# Patient Record
Sex: Male | Born: 1975
Health system: Southern US, Community
[De-identification: ages and names within clinical notes are randomized; demographics above are authoritative.]

## PROBLEM LIST (undated history)

## (undated) DIAGNOSIS — R519 Headache, unspecified: Secondary | ICD-10-CM

## (undated) DIAGNOSIS — G9389 Other specified disorders of brain: Secondary | ICD-10-CM

## (undated) DIAGNOSIS — N400 Enlarged prostate without lower urinary tract symptoms: Secondary | ICD-10-CM

## (undated) DIAGNOSIS — Q8500 Neurofibromatosis, unspecified: Secondary | ICD-10-CM

## (undated) HISTORY — PX: HAND SURGERY: SHX662

## (undated) HISTORY — PX: LEG SURGERY: SHX1003

## (undated) HISTORY — DX: Neurofibromatosis, unspecified: Q85.00

## (undated) HISTORY — DX: Benign prostatic hyperplasia without lower urinary tract symptoms: N40.0

---

## 2015-07-19 DIAGNOSIS — M9902 Segmental and somatic dysfunction of thoracic region: Secondary | ICD-10-CM | POA: Diagnosis not present

## 2015-07-19 DIAGNOSIS — M545 Low back pain: Secondary | ICD-10-CM | POA: Diagnosis not present

## 2015-07-19 DIAGNOSIS — M9903 Segmental and somatic dysfunction of lumbar region: Secondary | ICD-10-CM | POA: Diagnosis not present

## 2015-07-19 DIAGNOSIS — M9901 Segmental and somatic dysfunction of cervical region: Secondary | ICD-10-CM | POA: Diagnosis not present

## 2015-07-19 DIAGNOSIS — M791 Myalgia: Secondary | ICD-10-CM | POA: Diagnosis not present

## 2015-07-19 DIAGNOSIS — M542 Cervicalgia: Secondary | ICD-10-CM | POA: Diagnosis not present

## 2015-07-20 ENCOUNTER — Ambulatory Visit (INDEPENDENT_AMBULATORY_CARE_PROVIDER_SITE_OTHER): Payer: BLUE CROSS/BLUE SHIELD | Admitting: Family Medicine

## 2015-07-20 ENCOUNTER — Encounter: Payer: Self-pay | Admitting: Family Medicine

## 2015-07-20 VITALS — BP 132/81 | HR 66 | Temp 98.2°F | Resp 16 | Ht 72.0 in | Wt 182.0 lb

## 2015-07-20 DIAGNOSIS — IMO0001 Reserved for inherently not codable concepts without codable children: Secondary | ICD-10-CM

## 2015-07-20 DIAGNOSIS — Q8500 Neurofibromatosis, unspecified: Secondary | ICD-10-CM | POA: Diagnosis not present

## 2015-07-20 DIAGNOSIS — N400 Enlarged prostate without lower urinary tract symptoms: Secondary | ICD-10-CM

## 2015-07-20 DIAGNOSIS — N529 Male erectile dysfunction, unspecified: Secondary | ICD-10-CM | POA: Diagnosis not present

## 2015-07-20 DIAGNOSIS — Z0000 Encounter for general adult medical examination without abnormal findings: Secondary | ICD-10-CM

## 2015-07-20 DIAGNOSIS — I781 Nevus, non-neoplastic: Secondary | ICD-10-CM

## 2015-07-20 DIAGNOSIS — D1801 Hemangioma of skin and subcutaneous tissue: Secondary | ICD-10-CM | POA: Insufficient documentation

## 2015-07-20 DIAGNOSIS — L821 Other seborrheic keratosis: Secondary | ICD-10-CM | POA: Insufficient documentation

## 2015-07-20 HISTORY — DX: Neurofibromatosis, unspecified: Q85.00

## 2015-07-20 HISTORY — DX: Benign prostatic hyperplasia without lower urinary tract symptoms: N40.0

## 2015-07-20 MED ORDER — TAMSULOSIN HCL 0.4 MG PO CAPS: 0.4000 mg | ORAL_CAPSULE | Freq: Every day | ORAL | Status: DC

## 2015-07-20 MED ORDER — SILDENAFIL CITRATE 20 MG PO TABS: 20.0000 mg | ORAL_TABLET | ORAL | Status: DC | PRN

## 2015-07-20 NOTE — Progress Notes (Signed)
       Aaron Espinoza is a 40 y.o. male who presents to [facility] today for establish care and well visit. It has been quite some time since the patient last saw a doctor for general medical issues. He feels well. His main chronic medical problem is neurofibromatosis which typically does not cause him any problems.  He does however note as he is eating she's had some few issues come up mainly difficulty urinating and urinary urgency along with some mild erectile dysfunction. Additionally he notes several new skin lesions she is a bit concerned about. No fevers or chills nausea vomiting or diarrhea.  The skin lesion is most concerned about is on his left forehead. It is occasionally irritated and sometimes bothersome especially when he combs his hair or puts glasses on.  AUA Symptom Score is 8/35 moderate QOL score is 3 Mixed   History reviewed. No pertinent past medical history. History reviewed. No pertinent past surgical history. Social History  Substance Use Topics  . Smoking status: Current Every Day Smoker    Types: Cigars  . Smokeless tobacco: Not on file  . Alcohol Use: Yes   family history includes Cancer in his paternal grandfather.  ROS as above:  Medications: Current Outpatient Prescriptions  Medication Sig Dispense Refill  . sildenafil (REVATIO) 20 MG tablet Take 1 tablet (20 mg total) by mouth as needed. 50 tablet 11  . tamsulosin (FLOMAX) 0.4 MG CAPS capsule Take 1 capsule (0.4 mg total) by mouth daily. 30 capsule 3   No current facility-administered medications for this visit.   No Known Allergies   Exam:  BP 132/81 mmHg  Pulse 66  Temp(Src) 98.2 F (36.8 C) (Oral)  Resp 16  Ht 6' (1.829 m)  Wt 182 lb (82.555 kg)  BMI 24.68 kg/m2  SpO2 97% Gen: Well NAD HEENT: EOMI,  MMM Lungs: Normal work of breathing. CTABL Heart: RRR no MRG Abd:  NABS, Soft. Nondistended, Nontender Exts: Brisk capillary refill, warm and well perfused.  Rectal exam: Normal appearing anus with slightly enlarged prostate. Non-tender. No nodules palpated.  Skin: Occasional neurofibromatoses noted. Multiple cherry angiomas and seborrheic keratoses present. The 1 cm small lesion on the left forehead is consistent with seborrheic keratoses.  Cryosurgery left forehead seborrheic keratoses: Consent obtained timeout performed The area was sprayed with liquid nitrogen to achieve a good frost for 30 seconds. This is repeated 2 times  No results found for this or any previous visit (from the past 24 hour(s)). No results found.    Assessment and Plan: 40 y.o. male with  1) well visit: Doing well. We'll obtain basic labs. Overall patient appears to be in pretty good health. 2) erectile dysfunction: Discussed options. Check testosterone level and treat with Viagra 3) BPH likely. See scanned AUA document. Treat with Flomax. Recheck in a few months. Check PSA level 4) seborrheic keratoses: Treated with cryosurgery  Discussed warning signs or symptoms. Please see discharge instructions. Patient expresses understanding.

## 2015-07-20 NOTE — Patient Instructions (Signed)
Thank you for coming in today. Get morning fasting labs soon.  You should hear from [facility].  Start flomax.  Return in 3-6 months or sooner if needed.    Cryosurgery for Skin Conditions Cryosurgery, also called cryotherapy, is the use of extreme cold to freeze and remove abnormal or diseased tissue. Growths on the skin such as warts, precancerous skin lesions (actinic keratoses), and some kinds of skin cancer may be removed with cryosurgery. LET [facility] CARE PROVIDER KNOW ABOUT:  Any allergies you have.  All medicines you are taking, including vitamins, herbs, eye drops, creams, and over-the-counter medicines.  Previous problems you or members of your family have had with the use of anesthetics.  Any blood disorders you have.  Previous surgeries you have had.  Medical conditions you have. RISKS AND COMPLICATIONS Generally, this is a safe procedure. However, as with any procedure, complications can occur. Possible complications include:  Scars.  Changes in skin color (lighter or darker than normal skin tone).  Swelling.  Nerve damage and loss of feeling (rare). BEFORE THE PROCEDURE No preparation is necessary. PROCEDURE  Cryosurgery usually takes a few minutes and can be done in your health care provider's office. There are different methods for performing cryosurgery.   Your health care provider may use a device (probe) that has liquid nitrogen flowing through it. The liquid nitrogen cools the probe. The probe is then applied to the growth until it is frozen and destroyed.  Your health care provider may spray liquid nitrogen directly on the growth. AFTER THE PROCEDURE Shortly after the procedure, the treated area will become red and swollen. This is normal. You will be advised to keep the treated area clean and covered with a bandage until healed. You will be able to go home shortly after the procedure. You may need the treatment again if the growth comes back.    This information is not intended to replace advice given to you by your health care provider. Make sure you discuss any questions you have with your health care provider.   Document Released: 01/19/2000 Document Revised: 09/23/2012 Document Reviewed: 08/21/2012 Elsevier Interactive Patient Education 2016 Elsevier Inc[employer]   Benign Prostatic Hyperplasia An enlarged prostate (benign prostatic hyperplasia) is common in older men. You may experience the following:  Weak urine stream.  Dribbling.  Feeling like the bladder has not emptied completely.  Difficulty starting urination.  Getting up frequently at night to urinate.  Urinating more frequently during the day. HOME CARE INSTRUCTIONS  Monitor your prostatic hyperplasia for any changes. The following actions may help to alleviate any discomfort you are experiencing:  Give yourself time when you urinate.  Stay away from alcohol.  Avoid beverages containing caffeine, such as coffee, tea, and colas, because they can make the problem worse.  Avoid decongestants, antihistamines, and some prescription medicines that can make the problem worse.  Follow up with your health care provider for further treatment as recommended. SEEK MEDICAL CARE IF:  You are experiencing progressive difficulty voiding.  Your urine stream is progressively getting narrower.  You are awaking from sleep with the urge to void more frequently.  You are constantly feeling the need to void.  You experience loss of urine, especially in small amounts. SEEK IMMEDIATE MEDICAL CARE IF:   You develop increased pain with urination or are unable to urinate.  You develop severe abdominal pain, vomiting, a high fever, or fainting.  You develop back pain or blood in your urine. MAKE SURE  YOU:   Understand these instructions.  Will watch your condition.  Will get help right away if you are not doing well or get worse.   This information is not intended to  replace advice given to you by your health care provider. Make sure you discuss any questions you have with your health care provider.   Document Released: 01/21/2005 Document Revised: 02/11/2014 Document Reviewed: 06/23/2012 Elsevier Interactive Patient Education [employer].

## 2015-07-26 ENCOUNTER — Telehealth: Payer: Self-pay | Admitting: Family Medicine

## 2015-07-26 DIAGNOSIS — N400 Enlarged prostate without lower urinary tract symptoms: Secondary | ICD-10-CM | POA: Diagnosis not present

## 2015-07-26 DIAGNOSIS — M9902 Segmental and somatic dysfunction of thoracic region: Secondary | ICD-10-CM | POA: Diagnosis not present

## 2015-07-26 DIAGNOSIS — M542 Cervicalgia: Secondary | ICD-10-CM | POA: Diagnosis not present

## 2015-07-26 DIAGNOSIS — I781 Nevus, non-neoplastic: Secondary | ICD-10-CM | POA: Diagnosis not present

## 2015-07-26 DIAGNOSIS — M9901 Segmental and somatic dysfunction of cervical region: Secondary | ICD-10-CM | POA: Diagnosis not present

## 2015-07-26 DIAGNOSIS — N529 Male erectile dysfunction, unspecified: Secondary | ICD-10-CM | POA: Diagnosis not present

## 2015-07-26 DIAGNOSIS — L821 Other seborrheic keratosis: Secondary | ICD-10-CM | POA: Diagnosis not present

## 2015-07-26 DIAGNOSIS — M791 Myalgia: Secondary | ICD-10-CM | POA: Diagnosis not present

## 2015-07-26 LAB — CBC
HCT: 48.5 % (ref 38.5–50.0)
Hemoglobin: 16.7 g/dL (ref 13.2–17.1)
MCH: 30.5 pg (ref 27.0–33.0)
MCHC: 34.4 g/dL (ref 32.0–36.0)
MCV: 88.7 fL (ref 80.0–100.0)
MPV: 9.7 fL (ref 7.5–12.5)
Platelets: 266 10*3/uL (ref 140–400)
RBC: 5.47 MIL/uL (ref 4.20–5.80)
RDW: 12.6 % (ref 11.0–15.0)
WBC: 6.1 10*3/uL (ref 3.8–10.8)

## 2015-07-26 NOTE — Telephone Encounter (Signed)
Pt came in and stated that his record is showing he is an everyday smoker and he does not smoke at all and would like for that to be changed. Thanks

## 2015-07-27 ENCOUNTER — Encounter: Payer: Self-pay | Admitting: Family Medicine

## 2015-07-27 LAB — LIPID PANEL
Cholesterol: 175 mg/dL (ref 125–200)
HDL: 60 mg/dL (ref >=40)
LDL Cholesterol: 107 mg/dL (ref <130)
Total CHOL/HDL Ratio: 2.9 Ratio (ref <=5.0)
Triglycerides: 41 mg/dL (ref <150)
VLDL: 8 mg/dL (ref <30)

## 2015-07-27 LAB — COMPREHENSIVE METABOLIC PANEL
ALT: 15 U/L (ref 9–46)
AST: 17 U/L (ref 10–40)
Albumin: 4.3 g/dL (ref 3.6–5.1)
Alkaline Phosphatase: 65 U/L (ref 40–115)
BUN: 13 mg/dL (ref 7–25)
CO2: 24 mmol/L (ref 20–31)
Calcium: 9.4 mg/dL (ref 8.6–10.3)
Chloride: 100 mmol/L (ref 98–110)
Creat: 0.75 mg/dL (ref 0.60–1.35)
Glucose, Bld: 85 mg/dL (ref 65–99)
Potassium: 5 mmol/L (ref 3.5–5.3)
Sodium: 137 mmol/L (ref 135–146)
Total Bilirubin: 0.7 mg/dL (ref 0.2–1.2)
Total Protein: 7.2 g/dL (ref 6.1–8.1)

## 2015-07-27 LAB — TSH: TSH: 2.5 mIU/L (ref 0.40–4.50)

## 2015-07-27 LAB — VITAMIN D 25 HYDROXY (VIT D DEFICIENCY, FRACTURES): Vit D, 25-Hydroxy: 41 ng/mL (ref 30–100)

## 2015-07-27 LAB — HEMOGLOBIN A1C
Hgb A1c MFr Bld: 4.8 % (ref <5.7)
Mean Plasma Glucose: 91 mg/dL

## 2015-07-27 LAB — TESTOSTERONE: Testosterone: 764 ng/dL (ref 250–827)

## 2015-07-27 LAB — PSA: PSA: 0.52 ng/mL (ref <=4.00)

## 2015-07-27 NOTE — Progress Notes (Signed)
Quick Note:  Labs are very normal.  Testosterone is normal in the 700s ______

## 2015-07-27 NOTE — Telephone Encounter (Signed)
Done

## 2015-08-02 DIAGNOSIS — M791 Myalgia: Secondary | ICD-10-CM | POA: Diagnosis not present

## 2015-08-02 DIAGNOSIS — M9901 Segmental and somatic dysfunction of cervical region: Secondary | ICD-10-CM | POA: Diagnosis not present

## 2015-08-02 DIAGNOSIS — M9902 Segmental and somatic dysfunction of thoracic region: Secondary | ICD-10-CM | POA: Diagnosis not present

## 2015-08-02 DIAGNOSIS — M542 Cervicalgia: Secondary | ICD-10-CM | POA: Diagnosis not present

## 2015-08-09 DIAGNOSIS — M9902 Segmental and somatic dysfunction of thoracic region: Secondary | ICD-10-CM | POA: Diagnosis not present

## 2015-08-09 DIAGNOSIS — M9903 Segmental and somatic dysfunction of lumbar region: Secondary | ICD-10-CM | POA: Diagnosis not present

## 2015-08-09 DIAGNOSIS — M9901 Segmental and somatic dysfunction of cervical region: Secondary | ICD-10-CM | POA: Diagnosis not present

## 2015-08-09 DIAGNOSIS — M542 Cervicalgia: Secondary | ICD-10-CM | POA: Diagnosis not present

## 2015-08-09 DIAGNOSIS — M545 Low back pain: Secondary | ICD-10-CM | POA: Diagnosis not present

## 2015-08-09 DIAGNOSIS — M791 Myalgia: Secondary | ICD-10-CM | POA: Diagnosis not present

## 2015-08-16 DIAGNOSIS — M9901 Segmental and somatic dysfunction of cervical region: Secondary | ICD-10-CM | POA: Diagnosis not present

## 2015-08-16 DIAGNOSIS — M545 Low back pain: Secondary | ICD-10-CM | POA: Diagnosis not present

## 2015-08-16 DIAGNOSIS — M542 Cervicalgia: Secondary | ICD-10-CM | POA: Diagnosis not present

## 2015-08-16 DIAGNOSIS — M791 Myalgia: Secondary | ICD-10-CM | POA: Diagnosis not present

## 2015-08-16 DIAGNOSIS — M9902 Segmental and somatic dysfunction of thoracic region: Secondary | ICD-10-CM | POA: Diagnosis not present

## 2015-08-16 DIAGNOSIS — M9903 Segmental and somatic dysfunction of lumbar region: Secondary | ICD-10-CM | POA: Diagnosis not present

## 2015-08-23 DIAGNOSIS — M9902 Segmental and somatic dysfunction of thoracic region: Secondary | ICD-10-CM | POA: Diagnosis not present

## 2015-08-23 DIAGNOSIS — M9901 Segmental and somatic dysfunction of cervical region: Secondary | ICD-10-CM | POA: Diagnosis not present

## 2015-08-23 DIAGNOSIS — M9903 Segmental and somatic dysfunction of lumbar region: Secondary | ICD-10-CM | POA: Diagnosis not present

## 2015-08-23 DIAGNOSIS — M791 Myalgia: Secondary | ICD-10-CM | POA: Diagnosis not present

## 2015-08-23 DIAGNOSIS — M545 Low back pain: Secondary | ICD-10-CM | POA: Diagnosis not present

## 2015-08-23 DIAGNOSIS — M542 Cervicalgia: Secondary | ICD-10-CM | POA: Diagnosis not present

## 2015-08-30 DIAGNOSIS — M542 Cervicalgia: Secondary | ICD-10-CM | POA: Diagnosis not present

## 2015-08-30 DIAGNOSIS — M791 Myalgia: Secondary | ICD-10-CM | POA: Diagnosis not present

## 2015-08-30 DIAGNOSIS — M9903 Segmental and somatic dysfunction of lumbar region: Secondary | ICD-10-CM | POA: Diagnosis not present

## 2015-08-30 DIAGNOSIS — M9902 Segmental and somatic dysfunction of thoracic region: Secondary | ICD-10-CM | POA: Diagnosis not present

## 2015-08-30 DIAGNOSIS — M9901 Segmental and somatic dysfunction of cervical region: Secondary | ICD-10-CM | POA: Diagnosis not present

## 2015-08-30 DIAGNOSIS — M545 Low back pain: Secondary | ICD-10-CM | POA: Diagnosis not present

## 2015-09-06 DIAGNOSIS — M9901 Segmental and somatic dysfunction of cervical region: Secondary | ICD-10-CM | POA: Diagnosis not present

## 2015-09-06 DIAGNOSIS — M791 Myalgia: Secondary | ICD-10-CM | POA: Diagnosis not present

## 2015-09-06 DIAGNOSIS — M9902 Segmental and somatic dysfunction of thoracic region: Secondary | ICD-10-CM | POA: Diagnosis not present

## 2015-09-06 DIAGNOSIS — M542 Cervicalgia: Secondary | ICD-10-CM | POA: Diagnosis not present

## 2015-11-16 ENCOUNTER — Other Ambulatory Visit: Payer: Self-pay | Admitting: Family Medicine

## 2015-12-13 DIAGNOSIS — M542 Cervicalgia: Secondary | ICD-10-CM | POA: Diagnosis not present

## 2015-12-13 DIAGNOSIS — M791 Myalgia: Secondary | ICD-10-CM | POA: Diagnosis not present

## 2015-12-13 DIAGNOSIS — M9902 Segmental and somatic dysfunction of thoracic region: Secondary | ICD-10-CM | POA: Diagnosis not present

## 2015-12-13 DIAGNOSIS — M9901 Segmental and somatic dysfunction of cervical region: Secondary | ICD-10-CM | POA: Diagnosis not present

## 2015-12-20 DIAGNOSIS — M542 Cervicalgia: Secondary | ICD-10-CM | POA: Diagnosis not present

## 2015-12-20 DIAGNOSIS — M9902 Segmental and somatic dysfunction of thoracic region: Secondary | ICD-10-CM | POA: Diagnosis not present

## 2015-12-20 DIAGNOSIS — M9901 Segmental and somatic dysfunction of cervical region: Secondary | ICD-10-CM | POA: Diagnosis not present

## 2015-12-20 DIAGNOSIS — M791 Myalgia: Secondary | ICD-10-CM | POA: Diagnosis not present

## 2016-01-03 DIAGNOSIS — M9901 Segmental and somatic dysfunction of cervical region: Secondary | ICD-10-CM | POA: Diagnosis not present

## 2016-01-03 DIAGNOSIS — M9902 Segmental and somatic dysfunction of thoracic region: Secondary | ICD-10-CM | POA: Diagnosis not present

## 2016-01-03 DIAGNOSIS — M542 Cervicalgia: Secondary | ICD-10-CM | POA: Diagnosis not present

## 2016-01-03 DIAGNOSIS — M545 Low back pain: Secondary | ICD-10-CM | POA: Diagnosis not present

## 2016-01-03 DIAGNOSIS — M791 Myalgia: Secondary | ICD-10-CM | POA: Diagnosis not present

## 2016-01-03 DIAGNOSIS — M9903 Segmental and somatic dysfunction of lumbar region: Secondary | ICD-10-CM | POA: Diagnosis not present

## 2016-01-10 DIAGNOSIS — M9902 Segmental and somatic dysfunction of thoracic region: Secondary | ICD-10-CM | POA: Diagnosis not present

## 2016-01-10 DIAGNOSIS — M542 Cervicalgia: Secondary | ICD-10-CM | POA: Diagnosis not present

## 2016-01-10 DIAGNOSIS — M791 Myalgia: Secondary | ICD-10-CM | POA: Diagnosis not present

## 2016-01-10 DIAGNOSIS — M9901 Segmental and somatic dysfunction of cervical region: Secondary | ICD-10-CM | POA: Diagnosis not present

## 2016-01-24 DIAGNOSIS — M9901 Segmental and somatic dysfunction of cervical region: Secondary | ICD-10-CM | POA: Diagnosis not present

## 2016-01-24 DIAGNOSIS — M9902 Segmental and somatic dysfunction of thoracic region: Secondary | ICD-10-CM | POA: Diagnosis not present

## 2016-01-24 DIAGNOSIS — M9903 Segmental and somatic dysfunction of lumbar region: Secondary | ICD-10-CM | POA: Diagnosis not present

## 2016-01-24 DIAGNOSIS — M542 Cervicalgia: Secondary | ICD-10-CM | POA: Diagnosis not present

## 2016-01-24 DIAGNOSIS — M791 Myalgia: Secondary | ICD-10-CM | POA: Diagnosis not present

## 2016-01-24 DIAGNOSIS — M545 Low back pain: Secondary | ICD-10-CM | POA: Diagnosis not present

## 2016-02-07 DIAGNOSIS — M25552 Pain in left hip: Secondary | ICD-10-CM | POA: Diagnosis not present

## 2016-02-07 DIAGNOSIS — M9903 Segmental and somatic dysfunction of lumbar region: Secondary | ICD-10-CM | POA: Diagnosis not present

## 2016-02-07 DIAGNOSIS — M545 Low back pain: Secondary | ICD-10-CM | POA: Diagnosis not present

## 2016-02-07 DIAGNOSIS — M9905 Segmental and somatic dysfunction of pelvic region: Secondary | ICD-10-CM | POA: Diagnosis not present

## 2016-04-12 ENCOUNTER — Other Ambulatory Visit: Payer: Self-pay | Admitting: Family Medicine

## 2016-04-24 DIAGNOSIS — M545 Low back pain: Secondary | ICD-10-CM | POA: Diagnosis not present

## 2016-04-24 DIAGNOSIS — M9903 Segmental and somatic dysfunction of lumbar region: Secondary | ICD-10-CM | POA: Diagnosis not present

## 2016-04-24 DIAGNOSIS — M9901 Segmental and somatic dysfunction of cervical region: Secondary | ICD-10-CM | POA: Diagnosis not present

## 2016-04-24 DIAGNOSIS — M542 Cervicalgia: Secondary | ICD-10-CM | POA: Diagnosis not present

## 2016-05-20 ENCOUNTER — Other Ambulatory Visit: Payer: Self-pay | Admitting: Family Medicine

## 2016-06-20 ENCOUNTER — Other Ambulatory Visit: Payer: Self-pay | Admitting: Family Medicine

## 2016-06-26 ENCOUNTER — Encounter: Payer: Self-pay | Admitting: Family Medicine

## 2016-06-26 ENCOUNTER — Ambulatory Visit (INDEPENDENT_AMBULATORY_CARE_PROVIDER_SITE_OTHER): Payer: BLUE CROSS/BLUE SHIELD | Admitting: Family Medicine

## 2016-06-26 VITALS — BP 135/68 | HR 52 | Wt 185.0 lb

## 2016-06-26 DIAGNOSIS — N401 Enlarged prostate with lower urinary tract symptoms: Secondary | ICD-10-CM

## 2016-06-26 DIAGNOSIS — R350 Frequency of micturition: Secondary | ICD-10-CM | POA: Diagnosis not present

## 2016-06-26 LAB — COMPLETE METABOLIC PANEL WITHOUT GFR
ALT: 14 U/L (ref 9–46)
AST: 16 U/L (ref 10–40)
Albumin: 4.4 g/dL (ref 3.6–5.1)
Alkaline Phosphatase: 65 U/L (ref 40–115)
BUN: 21 mg/dL (ref 7–25)
CO2: 24 mmol/L (ref 20–31)
Calcium: 9.6 mg/dL (ref 8.6–10.3)
Chloride: 104 mmol/L (ref 98–110)
Creat: 0.81 mg/dL (ref 0.60–1.35)
GFR, Est African American: >89 mL/min
GFR, Est Non African American: >89 mL/min
Glucose, Bld: 92 mg/dL (ref 65–99)
Potassium: 4.8 mmol/L (ref 3.5–5.3)
Sodium: 139 mmol/L (ref 135–146)
Total Bilirubin: 0.7 mg/dL (ref 0.2–1.2)
Total Protein: 7.2 g/dL (ref 6.1–8.1)

## 2016-06-26 MED ORDER — TAMSULOSIN HCL 0.4 MG PO CAPS: 0.4000 mg | ORAL_CAPSULE | Freq: Every day | ORAL | 3 refills | Status: DC

## 2016-06-26 MED ORDER — TADALAFIL 5 MG PO TABS: 5.0000 mg | ORAL_TABLET | Freq: Every day | ORAL | 11 refills | Status: DC

## 2016-06-26 NOTE — Patient Instructions (Signed)
Thank you for coming in today. Start Tadalafil daily.   Recheck in 1 year or sooner if needed.  Do not take viagra and Tadalafil together.  Continue flomax.     Benign Prostatic Hyperplasia Benign prostatic hyperplasia is when the prostate gland is bigger than normal (enlarged). The prostate is a gland that produces the fluid that goes into semen. It is near the opening to the bladder and it surrounds the tube that drains urine out of the body (urethra). Benign prostatic hyperplasia is common among older men and it typically causes problems with urinating. The prostate grows slowly as you age. As the prostate grows, it can pinch the urethra. This causes the bladder to work too hard to pass urine, which leads to a thickened bladder wall. The bladder may eventually become weak and unable to empty completely. What are the causes? The exact cause of this condition is not known. It may be related to changes in hormones as the body ages. What increases the risk? You are more likely to develop this condition if:  You have a family history of the condition.  You are age 23 or older.  You have a history of erectile dysfunction.  You do not exercise.  You have certain medical conditions, including:  Type 2 diabetes.  Obesity.  Heart and circulatory disease. What are the signs or symptoms? Symptoms of this condition include:  Weak or interrupted urine stream.  Dribbling or leaking urine.  Feeling like the bladder has not emptied completely.  Difficulty starting urination.  Getting up frequently at night to urinate.  Urinating more often (8 or more times a day).  Accidental loss of urine (urinary incontinence).  Pain during urination or ejaculation.  Urine with an unusual smell or color. The size of the prostate does not always determine the severity of the symptoms. For example, a man with a large prostate may experience minor symptoms, or a man with a smaller prostate may  experience a severe blockage. How is this diagnosed? This condition may be diagnosed based on:  Your medical history and symptoms.  A physical exam. This usually includes a digital rectal exam. During this exam, your health care provider places a gloved, lubricated finger into the rectum to feel the size of the prostate.  A blood test. This test checks for high levels of a protein that is produced by the prostate (prostate specific antigen, PSA).  Tests to examine how well the urethra and bladder are functioning (urodynamic tests).  Cystoscopy. For this test, a small, tube-shaped instrument (cystoscope) is used to look inside the urethra and bladder. The cystoscope is placed into the urinary tract through the opening at the tip of the penis.  Urine tests.  Ultrasound. How is this treated? Treatment for this condition depends on how severe your symptoms are. Treatment may include:  Active surveillance or "watchful waiting." If your symptoms are mild, your health care provider may delay treatment and ask you to keep track of your symptoms. You will have regular checkups to examine the size of your prostate, discuss symptoms, and determine whether treatment is needed.  Medicines. These may be used to:  Stop prostate growth.  Shrink the prostate.  Relieve symptoms.  Lifestyle changes, including:  Pelvic floor muscle exercises. The pelvic floor muscles are a group of muscles that relax when you urinate.  Bladder training. This involves exercises that train the bladder to hold more urine for longer periods.  Reducing the amount of liquid that  you drink. This is especially important before sleeping and before long periods of time spent in public.  Reducing the amount of caffeine and alcohol that you drink.  Treating or preventing constipation.  Surgery to reduce the size of the prostate or widen the urethra. This is typically done if your symptoms are severe or there are serious  complications from the enlarged prostate. Follow these instructions at home: Medicines   Take over-the-counter and prescription medicines as told by your health care provider.  Avoid certain medicines, such as decongestants, antihistamines, and some prescription medicines as told by your health care provider. Ask your health care provider which medicines you should avoid. General instructions   Monitor your symptoms for any changes. Tell your health care provider about any changes.  Give yourself time when you urinate.  Avoid certain beverages that can irritate the bladder, such as:  Alcohol.  Caffeinated drinks like coffee, tea, and cola.  Avoid drinking large amounts of liquid before bed or before going out in public.  Do pelvic floor muscle or bladder training exercises as told by your health care provider.  Keep all follow-up visits as told by your health care provider. This is important. Contact a health care provider if:  Your develop new or worse symptoms.  You have trouble getting or maintaining an erection.  You have a fever.  You have pain or burning during urination.  You have blood in your urine. Get help right away if:  You have severe pain when urinating.  You cannot urinate.  You have severe pain in your abdomen.  You are dizzy.  You faint.  You have severe back pain.  Your urine is dark red and difficult to see through.  You have large blood clots in your urine.  You have severe pain after an erection.  You have chest pain, dizziness, or nausea during sexual activity. Summary  The prostate is a gland that produces the fluid that goes into semen. It is near the opening to the bladder and it surrounds the tube that drains urine out of the body (urethra).  Benign prostatic hyperplasia is common among older men and it typically causes problems with urinating.  If your symptoms are mild, your health care provider may delay treatment and ask you  to keep track of your symptoms. You will have regular checkups to examine the size of your prostate, discuss symptoms, and determine whether treatment is needed.  If directed, you may need to avoid certain medicines, such as decongestants, antihistamines, and some prescription medicines.  Contact your health care provider if you develop new or worse symptoms. This information is not intended to replace advice given to you by your health care provider. Make sure you discuss any questions you have with your health care provider. Document Released: 01/21/2005 Document Revised: 12/11/2015 Document Reviewed: 12/11/2015 Elsevier Interactive Patient Education  2017 [employer].

## 2016-06-26 NOTE — Progress Notes (Signed)
       Aaron Espinoza is a 41 y.o. male who presents to [facility] today for follow-up BPH.  Patient last year was diagnosed with urinary symptoms thought to be due to BPH. He had enlarged prostate on exam. He was started on Flomax which helped some of his urgency symptoms. He notes he continues to have bothersome symptoms and is not completely controlled. He denies fevers chills vomiting or diarrhea. +  AUASS: Symptom Score 10/35 QOL score: 3/6   He does note some right-sided ear fullness in the past week or so. He denies decreased hearing. He notes some seasonal allergy symptoms such as sneezing and runny nose. He takes Allegra daily and has just started using a nasal steroid spray. He feels well otherwise.   Past Medical History:  Diagnosis Date  . BPH (benign prostatic hyperplasia) 07/20/2015  . NF (neurofibromatosis) ([place]) 07/20/2015   No past surgical history on file. Social History  Substance Use Topics  . Smoking status: Never Smoker  . Smokeless tobacco: Never Used  . Alcohol use 0.0 oz/week   family history includes Cancer in his paternal grandfather.  ROS as above:  Medications: Current Outpatient Prescriptions  Medication Sig Dispense Refill  . sildenafil (REVATIO) 20 MG tablet Take 1 tablet (20 mg total) by mouth as needed. 50 tablet 11  . tamsulosin (FLOMAX) 0.4 MG CAPS capsule Take 1 capsule (0.4 mg total) by mouth daily. 90 capsule 3  . tadalafil (CIALIS) 5 MG tablet Take 1 tablet (5 mg total) by mouth daily. Use daily. 6 tablet 11   No current facility-administered medications for this visit.    No Known Allergies  Health Maintenance Health Maintenance  Topic Date Due  . HIV Screening  03/15/1988  . TETANUS/TDAP  03/15/1992  . INFLUENZA VACCINE  09/04/2016     Exam:  BP 135/68   Pulse (!) 52   Wt 185 lb (83.9 kg)   BMI 25.09 kg/m    Gen: Well NAD HEENT: EOMI,  MMM Tympanic membranes are normal-appearing bilaterally Lungs: Normal work of breathing. CTABL Heart: Mild bradycardia no MRG Abd: NABS, Soft. Nondistended, Nontender Exts: Brisk capillary refill, warm and well perfused.   Lab Results  Component Value Date   PSA 0.52 07/26/2015      No results found for this or any previous visit (from the past 50 hour(s)). No results found.    Assessment and Plan: 41 y.o. male with  BPH. Patient is failing typical management of BPH with Flomax. AUA symptom score worsened.  Plan to start Cialis daily.  We'll check basic labs including CMP and recheck PSA. Return sooner if not improved otherwise recheck in one year.   Orders Placed This Encounter  Procedures  . COMPLETE METABOLIC PANEL WITH GFR  . PSA   Meds ordered this encounter  Medications  . tadalafil (CIALIS) 5 MG tablet    Sig: Take 1 tablet (5 mg total) by mouth daily. Use daily.    Dispense:  6 tablet    Refill:  11    For BPH. Pt failing Flomax  . tamsulosin (FLOMAX) 0.4 MG CAPS capsule    Sig: Take 1 capsule (0.4 mg total) by mouth daily.    Dispense:  90 capsule    Refill:  3     Discussed warning signs or symptoms. Please see discharge instructions. Patient expresses understanding.

## 2016-06-27 LAB — PSA: PSA: 0.5 ng/mL (ref <=4.0)

## 2016-07-04 ENCOUNTER — Telehealth: Payer: Self-pay | Admitting: *Deleted

## 2016-07-04 NOTE — Telephone Encounter (Signed)
Initiated PA over the phone for Cialis and I did #30 for #30 instead of 6  days since the dx is BPH and he will take it daily.  This will most likely be denied. The patient has to try TWO of the preferred meds which include Uroxitrol,cardura,doxizosin, duesteride,finesteride,terozosin.  In his case since he's tried flomax one of the other preferred meds needs to be tried before Cialis will be approved

## 2016-08-12 DIAGNOSIS — M25552 Pain in left hip: Secondary | ICD-10-CM | POA: Diagnosis not present

## 2016-08-12 DIAGNOSIS — M9905 Segmental and somatic dysfunction of pelvic region: Secondary | ICD-10-CM | POA: Diagnosis not present

## 2016-08-12 DIAGNOSIS — M9903 Segmental and somatic dysfunction of lumbar region: Secondary | ICD-10-CM | POA: Diagnosis not present

## 2016-08-12 DIAGNOSIS — M545 Low back pain: Secondary | ICD-10-CM | POA: Diagnosis not present

## 2016-09-24 DIAGNOSIS — M545 Low back pain: Secondary | ICD-10-CM | POA: Diagnosis not present

## 2016-09-24 DIAGNOSIS — M9903 Segmental and somatic dysfunction of lumbar region: Secondary | ICD-10-CM | POA: Diagnosis not present

## 2016-09-24 DIAGNOSIS — M25552 Pain in left hip: Secondary | ICD-10-CM | POA: Diagnosis not present

## 2016-09-24 DIAGNOSIS — M9905 Segmental and somatic dysfunction of pelvic region: Secondary | ICD-10-CM | POA: Diagnosis not present

## 2016-10-08 DIAGNOSIS — M25552 Pain in left hip: Secondary | ICD-10-CM | POA: Diagnosis not present

## 2016-10-08 DIAGNOSIS — M9905 Segmental and somatic dysfunction of pelvic region: Secondary | ICD-10-CM | POA: Diagnosis not present

## 2016-10-08 DIAGNOSIS — M545 Low back pain: Secondary | ICD-10-CM | POA: Diagnosis not present

## 2016-10-08 DIAGNOSIS — M9901 Segmental and somatic dysfunction of cervical region: Secondary | ICD-10-CM | POA: Diagnosis not present

## 2016-10-08 DIAGNOSIS — M9903 Segmental and somatic dysfunction of lumbar region: Secondary | ICD-10-CM | POA: Diagnosis not present

## 2016-10-22 DIAGNOSIS — M9903 Segmental and somatic dysfunction of lumbar region: Secondary | ICD-10-CM | POA: Diagnosis not present

## 2016-10-22 DIAGNOSIS — M545 Low back pain: Secondary | ICD-10-CM | POA: Diagnosis not present

## 2016-10-22 DIAGNOSIS — M25552 Pain in left hip: Secondary | ICD-10-CM | POA: Diagnosis not present

## 2016-10-22 DIAGNOSIS — M9905 Segmental and somatic dysfunction of pelvic region: Secondary | ICD-10-CM | POA: Diagnosis not present

## 2016-11-05 DIAGNOSIS — M545 Low back pain: Secondary | ICD-10-CM | POA: Diagnosis not present

## 2016-11-05 DIAGNOSIS — M9905 Segmental and somatic dysfunction of pelvic region: Secondary | ICD-10-CM | POA: Diagnosis not present

## 2016-11-05 DIAGNOSIS — M9903 Segmental and somatic dysfunction of lumbar region: Secondary | ICD-10-CM | POA: Diagnosis not present

## 2016-11-05 DIAGNOSIS — M25552 Pain in left hip: Secondary | ICD-10-CM | POA: Diagnosis not present

## 2016-11-19 ENCOUNTER — Ambulatory Visit: Payer: BLUE CROSS/BLUE SHIELD | Admitting: Family Medicine

## 2016-11-20 ENCOUNTER — Ambulatory Visit (INDEPENDENT_AMBULATORY_CARE_PROVIDER_SITE_OTHER): Payer: BLUE CROSS/BLUE SHIELD

## 2016-11-20 ENCOUNTER — Encounter: Payer: Self-pay | Admitting: Family Medicine

## 2016-11-20 ENCOUNTER — Ambulatory Visit (INDEPENDENT_AMBULATORY_CARE_PROVIDER_SITE_OTHER): Payer: BLUE CROSS/BLUE SHIELD | Admitting: Family Medicine

## 2016-11-20 VITALS — BP 129/71 | HR 62 | Temp 98.2°F | Wt 180.0 lb

## 2016-11-20 DIAGNOSIS — R05 Cough: Secondary | ICD-10-CM

## 2016-11-20 DIAGNOSIS — R059 Cough, unspecified: Secondary | ICD-10-CM

## 2016-11-20 DIAGNOSIS — M9901 Segmental and somatic dysfunction of cervical region: Secondary | ICD-10-CM | POA: Diagnosis not present

## 2016-11-20 DIAGNOSIS — M9903 Segmental and somatic dysfunction of lumbar region: Secondary | ICD-10-CM | POA: Diagnosis not present

## 2016-11-20 DIAGNOSIS — M542 Cervicalgia: Secondary | ICD-10-CM | POA: Diagnosis not present

## 2016-11-20 DIAGNOSIS — M25551 Pain in right hip: Secondary | ICD-10-CM | POA: Diagnosis not present

## 2016-11-20 DIAGNOSIS — M25552 Pain in left hip: Secondary | ICD-10-CM | POA: Diagnosis not present

## 2016-11-20 DIAGNOSIS — M545 Low back pain: Secondary | ICD-10-CM | POA: Diagnosis not present

## 2016-11-20 IMAGING — DX DG CHEST 2V
2 series · 2 of 2 positions shown · non-contrast
Comparison: None.

CLINICAL DATA: Persistent cough for 1 month.

EXAM:
CHEST  2 VIEW

[chest pa]
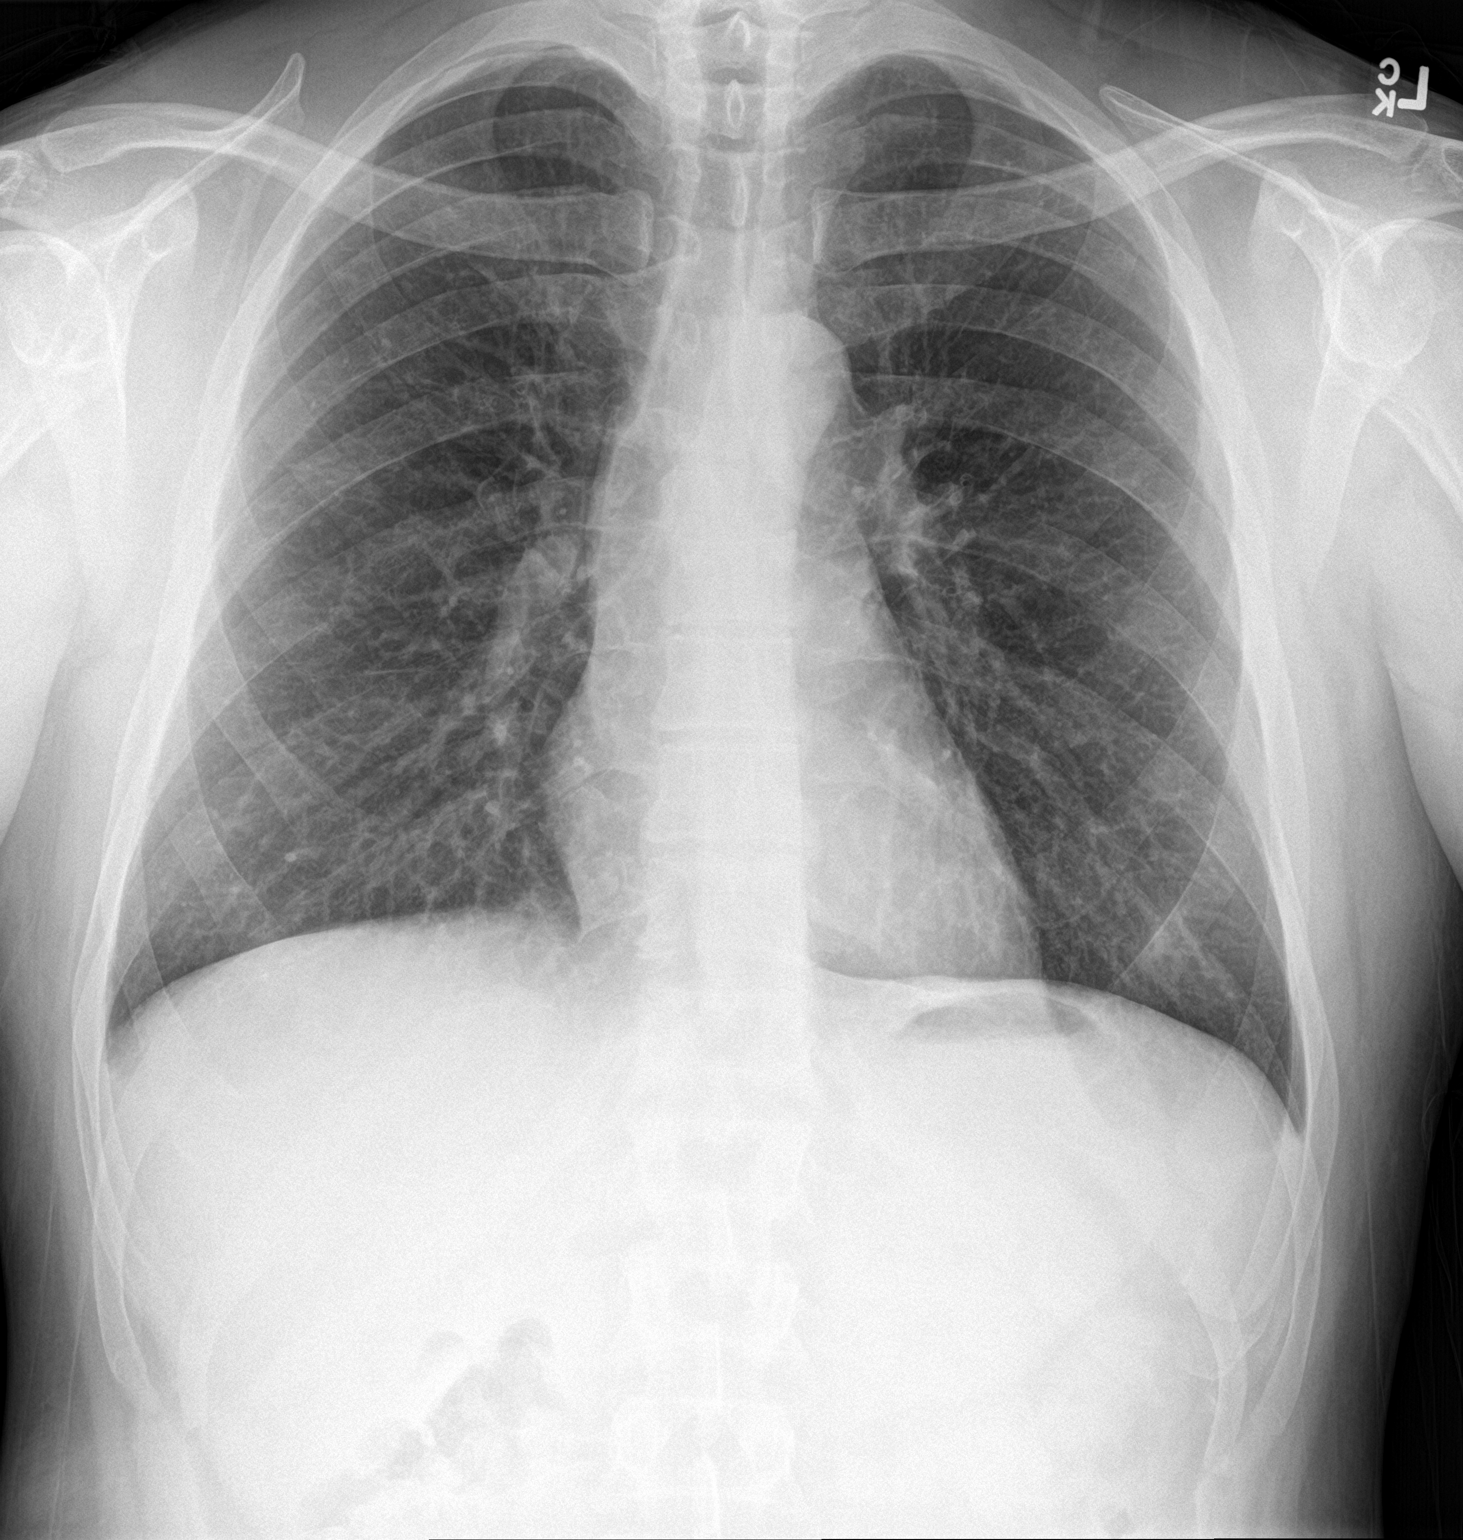

[chest lat]
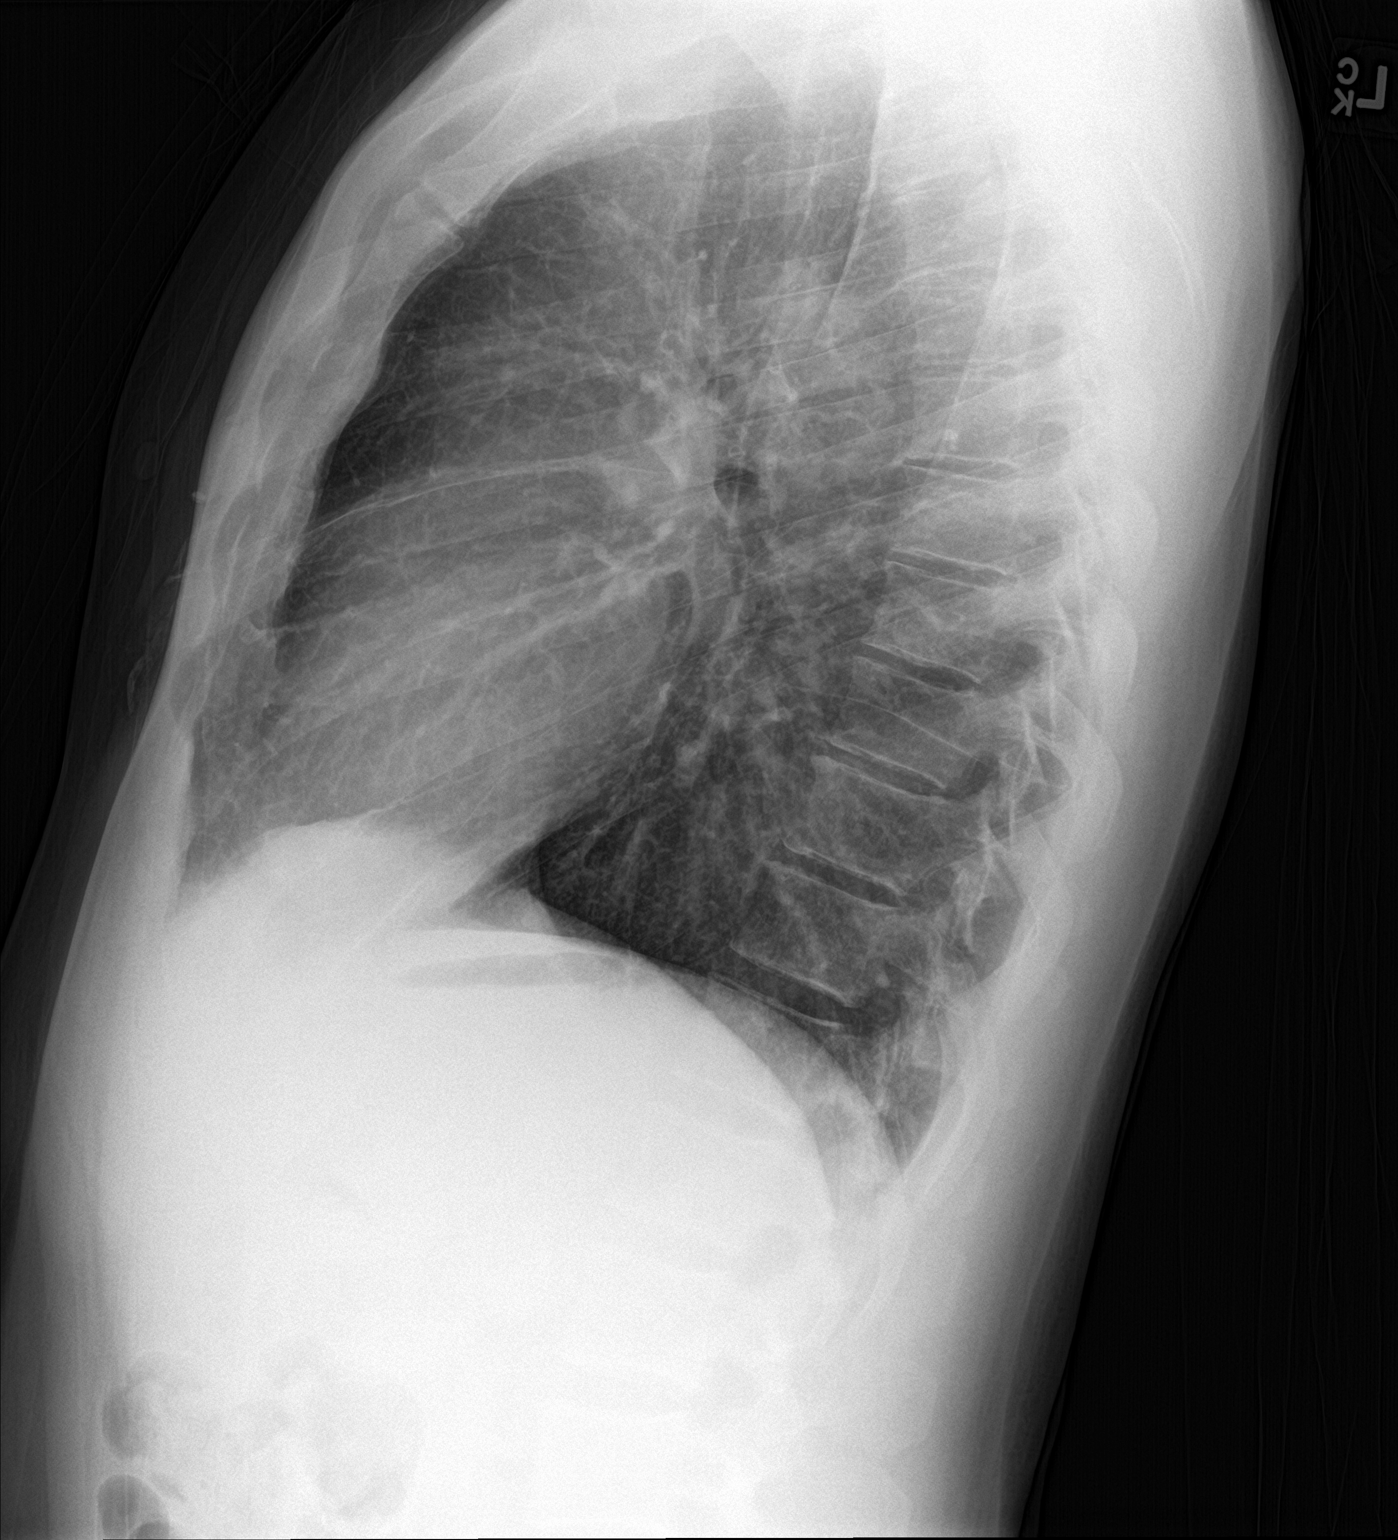

[2 of 2 positions shown; findings below may reference images not displayed]

FINDINGS: Normal heart size and mediastinal contours. No acute infiltrate or
edema. No effusion or pneumothorax. No acute osseous findings.
IMPRESSION: Negative chest.

## 2016-11-20 MED ORDER — PREDNISONE 10 MG PO TABS: 30.0000 mg | ORAL_TABLET | Freq: Every day | ORAL | 0 refills | Status: DC

## 2016-11-20 MED ORDER — GUAIFENESIN-CODEINE 100-10 MG/5ML PO SOLN: 5.0000 mL | Freq: Every evening | ORAL | 0 refills | Status: DC | PRN

## 2016-11-20 MED ORDER — BENZONATATE 200 MG PO CAPS: 200.0000 mg | ORAL_CAPSULE | Freq: Three times a day (TID) | ORAL | 0 refills | Status: DC | PRN

## 2016-11-20 NOTE — Patient Instructions (Signed)
Thank you for coming in today. Get xray today.  Use tessalon for cough during the day.  USe codeine for cough at bedtime.  Take prednisone if not better.  Call or go to the emergency room if you get worse, have trouble breathing, have chest pains, or palpitations.    Cough, Adult Coughing is a reflex that clears your throat and your airways. Coughing helps to heal and protect your lungs. It is normal to cough occasionally, but a cough that happens with other symptoms or lasts a long time may be a sign of a condition that needs treatment. A cough may last only 2-3 weeks (acute), or it may last longer than 8 weeks (chronic). What are the causes? Coughing is commonly caused by:  Breathing in substances that irritate your lungs.  A viral or bacterial respiratory infection.  Allergies.  Asthma.  Postnasal drip.  Smoking.  Acid backing up from the stomach into the esophagus (gastroesophageal reflux).  Certain medicines.  Chronic lung problems, including COPD (or rarely, lung cancer).  Other medical conditions such as heart failure.  Follow these instructions at home: Pay attention to any changes in your symptoms. Take these actions to help with your discomfort:  Take medicines only as told by your health care provider. ? If you were prescribed an antibiotic medicine, take it as told by your health care provider. Do not stop taking the antibiotic even if you start to feel better. ? Talk with your health care provider before you take a cough suppressant medicine.  Drink enough fluid to keep your urine clear or pale yellow.  If the air is dry, use a cold steam vaporizer or humidifier in your bedroom or your home to help loosen secretions.  Avoid anything that causes you to cough at work or at home.  If your cough is worse at night, try sleeping in a semi-upright position.  Avoid cigarette smoke. If you smoke, quit smoking. If you need help quitting, ask your health care  provider.  Avoid caffeine.  Avoid alcohol.  Rest as needed.  Contact a health care provider if:  You have new symptoms.  You cough up pus.  Your cough does not get better after 2-3 weeks, or your cough gets worse.  You cannot control your cough with suppressant medicines and you are losing sleep.  You develop pain that is getting worse or pain that is not controlled with pain medicines.  You have a fever.  You have unexplained weight loss.  You have night sweats. Get help right away if:  You cough up blood.  You have difficulty breathing.  Your heartbeat is very fast. This information is not intended to replace advice given to you by your health care provider. Make sure you discuss any questions you have with your health care provider. Document Released: 07/20/2010 Document Revised: 06/29/2015 Document Reviewed: 03/30/2014 Elsevier Interactive Patient Education  2017 [employer].

## 2016-11-20 NOTE — Progress Notes (Signed)
Aaron Espinoza is a 41 y.o. male who presents to [facility] today for cough. [place] initially had cold symptoms about 1 month ago. He improved except for cough which has continued. He notes continued dry non-productive cough that interferes with sleep. He denies any significant SOB, or wheezing. He has tried some over the counter medicines which help a bit.    Past Medical History:  Diagnosis Date  . BPH (benign prostatic hyperplasia) 07/20/2015  . NF (neurofibromatosis) ([place]) 07/20/2015   No past surgical history on file. Social History  Substance Use Topics  . Smoking status: Never Smoker  . Smokeless tobacco: Never Used  . Alcohol use 0.0 oz/week   family history includes Cancer in his paternal grandfather.  ROS as above:  Medications: Current Outpatient Prescriptions  Medication Sig Dispense Refill  . tamsulosin (FLOMAX) 0.4 MG CAPS capsule Take 1 capsule (0.4 mg total) by mouth daily. 90 capsule 3  . benzonatate (TESSALON) 200 MG capsule Take 1 capsule (200 mg total) by mouth 3 (three) times daily as needed for cough. 45 capsule 0  . guaiFENesin-codeine 100-10 MG/5ML syrup Take 5 mLs by mouth at bedtime as needed for cough. 120 mL 0  . predniSONE (DELTASONE) 10 MG tablet Take 3 tablets (30 mg total) by mouth daily with breakfast. 15 tablet 0   No current facility-administered medications for this visit.    No Known Allergies  Health Maintenance Health Maintenance  Topic Date Due  . HIV Screening  03/15/1988  . TETANUS/TDAP  03/15/1992  . INFLUENZA VACCINE  11/20/2017 (Originally 09/04/2016)     Exam:  BP 129/71   Pulse 62   Temp 98.2 F (36.8 C) (Oral)   Wt 180 lb (81.6 kg)   SpO2 99%   BMI 24.41 kg/m  Gen: Well NAD HEENT: EOMI,  MMM clear nasal discharge.  Lungs: Normal work of breathing. CTABL Heart: RRR no MRG Abd: NABS, Soft.  Nondistended, Nontender Exts: Brisk capillary refill, warm and well perfused.    No results found for this or any previous visit (from the past 72 hour(s)). No results found.    Assessment and Plan: 41 y.o. male with cough likely post viral.  Plan for workup with CXR.  Treat empirically with tessalon and codeine.  Use prednisone as a backup if not better.   F/u PRN   Orders Placed This Encounter  Procedures  . DG Chest 2 View    Order Specific Question:   Reason for exam:    Answer:   Cough, assess intra-thoracic pathology    Order Specific Question:   Preferred imaging location?    Answer:   [facility]   Meds ordered this encounter  Medications  . guaiFENesin-codeine 100-10 MG/5ML syrup    Sig: Take 5 mLs by mouth at bedtime as needed for cough.    Dispense:  120 mL    Refill:  0  . benzonatate (TESSALON) 200 MG capsule    Sig: Take 1 capsule (200 mg total) by mouth 3 (three) times daily as needed for cough.    Dispense:  45 capsule    Refill:  0  . predniSONE (DELTASONE) 10 MG tablet    Sig: Take 3 tablets (30 mg total) by mouth daily with breakfast.    Dispense:  15 tablet    Refill:  0     Discussed warning signs or symptoms. Please see discharge instructions. Patient expresses understanding.

## 2016-11-27 DIAGNOSIS — M545 Low back pain: Secondary | ICD-10-CM | POA: Diagnosis not present

## 2016-11-27 DIAGNOSIS — M25552 Pain in left hip: Secondary | ICD-10-CM | POA: Diagnosis not present

## 2016-11-27 DIAGNOSIS — M9903 Segmental and somatic dysfunction of lumbar region: Secondary | ICD-10-CM | POA: Diagnosis not present

## 2016-11-27 DIAGNOSIS — M9905 Segmental and somatic dysfunction of pelvic region: Secondary | ICD-10-CM | POA: Diagnosis not present

## 2016-12-04 DIAGNOSIS — M9903 Segmental and somatic dysfunction of lumbar region: Secondary | ICD-10-CM | POA: Diagnosis not present

## 2016-12-04 DIAGNOSIS — M545 Low back pain: Secondary | ICD-10-CM | POA: Diagnosis not present

## 2016-12-04 DIAGNOSIS — M542 Cervicalgia: Secondary | ICD-10-CM | POA: Diagnosis not present

## 2016-12-04 DIAGNOSIS — M25552 Pain in left hip: Secondary | ICD-10-CM | POA: Diagnosis not present

## 2016-12-04 DIAGNOSIS — M9901 Segmental and somatic dysfunction of cervical region: Secondary | ICD-10-CM | POA: Diagnosis not present

## 2016-12-04 DIAGNOSIS — M25551 Pain in right hip: Secondary | ICD-10-CM | POA: Diagnosis not present

## 2017-07-31 ENCOUNTER — Other Ambulatory Visit: Payer: Self-pay | Admitting: Family Medicine

## 2018-09-15 ENCOUNTER — Telehealth: Payer: Self-pay

## 2018-09-15 NOTE — Telephone Encounter (Signed)
Demarr wants an order for a COVID-19 test for an external lab. He would like to go to [place] for a rapid test. I did advise that [facility] may not take an order from [facility]. Please advise.

## 2018-09-16 NOTE — Telephone Encounter (Signed)
I think [facility] has a mechanism where you can just ask for 1 and will do it.  But I am not sure.  My understanding is I can order a test for [facility].

## 2018-09-16 NOTE — Telephone Encounter (Signed)
Aaron Espinoza got tested yesterday so he is going too wait for those results.  WB

## 2019-01-05 DIAGNOSIS — R5383 Other fatigue: Secondary | ICD-10-CM

## 2019-01-05 DIAGNOSIS — F09 Unspecified mental disorder due to known physiological condition: Secondary | ICD-10-CM

## 2019-01-05 HISTORY — DX: Other fatigue: R53.83

## 2019-01-05 HISTORY — DX: Unspecified mental disorder due to known physiological condition: F09

## 2019-03-03 ENCOUNTER — Telehealth: Payer: Self-pay | Admitting: Family Medicine

## 2019-03-04 ENCOUNTER — Encounter: Payer: Self-pay | Admitting: Family Medicine

## 2019-03-04 ENCOUNTER — Other Ambulatory Visit: Payer: Self-pay

## 2019-03-04 ENCOUNTER — Ambulatory Visit: Payer: BLUE CROSS/BLUE SHIELD | Admitting: Family Medicine

## 2019-03-04 ENCOUNTER — Ambulatory Visit (INDEPENDENT_AMBULATORY_CARE_PROVIDER_SITE_OTHER): Payer: BC Managed Care – PPO | Admitting: Family Medicine

## 2019-03-04 VITALS — BP 129/67 | HR 115 | Ht 72.0 in | Wt 176.0 lb

## 2019-03-04 DIAGNOSIS — Q8500 Neurofibromatosis, unspecified: Secondary | ICD-10-CM

## 2019-03-04 DIAGNOSIS — R21 Rash and other nonspecific skin eruption: Secondary | ICD-10-CM

## 2019-03-04 DIAGNOSIS — R5383 Other fatigue: Secondary | ICD-10-CM

## 2019-03-04 DIAGNOSIS — R8290 Unspecified abnormal findings in urine: Secondary | ICD-10-CM | POA: Diagnosis not present

## 2019-03-04 DIAGNOSIS — R413 Other amnesia: Secondary | ICD-10-CM | POA: Diagnosis not present

## 2019-03-04 DIAGNOSIS — R4189 Other symptoms and signs involving cognitive functions and awareness: Secondary | ICD-10-CM

## 2019-03-04 DIAGNOSIS — G3281 Cerebellar ataxia in diseases classified elsewhere: Secondary | ICD-10-CM

## 2019-03-04 LAB — POCT URINALYSIS DIP (CLINITEK)
Bilirubin, UA: NEGATIVE
Blood, UA: NEGATIVE
Glucose, UA: NEGATIVE mg/dL
Ketones, POC UA: NEGATIVE mg/dL
Leukocytes, UA: NEGATIVE
Nitrite, UA: NEGATIVE
POC PROTEIN,UA: NEGATIVE
Spec Grav, UA: 1.015 (ref 1.010–1.025)
Urobilinogen, UA: 0.2 E.U./dL
pH, UA: 7 (ref 5.0–8.0)

## 2019-03-04 MED ORDER — TRIAMCINOLONE ACETONIDE 0.1 % EX CREA: 1.0000 "application " | TOPICAL_CREAM | Freq: Every evening | CUTANEOUS | 1 refills | Status: DC | PRN

## 2019-03-04 NOTE — Progress Notes (Signed)
Acute Office Visit  Subjective:    Patient ID: Aaron Espinoza, male    DOB: 25-Aug-1975, 44 y.o.   MRN: [id number]  Chief Complaint  Patient presents with  . Altered Mental Status    HPI Patient is in today for change in mentation x 3 months.  His wife, [name],  is here with him today and she reports has been more mentally foggy.  She says it is almost like he is in a mental fog all the time.  She says it has not necessarily been getting progressively worse it has been there pretty consistently for the last 3 months.  He says he is sleeping more during the daytime.  No snoring.  Feels like having trouble with short term memory.  He denies mixing up words or saying inappropriate things or any major shift in mental health such as depression or anxiety symptoms.  He has noticed his gait is more slow, but no tremor.  He works long hours and says even people at work have noticed that he seems to be moving more slowly.  No fam hx of dementia or alzheimers.  He works for [employer]  He is coming in today because Yesterday went to play golf with a friend and the friend actually called his wife to say he wasn't like his usually self.  He says when he was trying to play disc golf he just felt like his motor skills were off he just was not able to do his usual things.  He reports that he is also been having some intermittent dizziness.  He really says it is more of a feeling of being off balance.  He denies feeling lightheaded or like he is going to pass out which usually brief.  Usually he notices when that happens he will feel like his legs are little bit more heavy and sometimes will even stumble.  He denies any weakness or tingling in the extremities.  He is not currently taking any prescription medications he just takes a few supplements and a daily antihistamine which is not new.  He also recently went for an eye exam and said had photographs taken of the back of his eye and says that they were normal.   He denies any change in weight.  He denies any night sweats.  He has been having a few more frequent frontal headaches.  He denies any vision change.  He has had some left hip pain for 2 mo as well.    He says he noticed it initially when he bent over to pick something up at work and felt a pain in his hip.  He notices it occasionally just with certain movements.  He denies any old injuries or recent injuries.  Dx with neurofibromatosis around age 24-5.  Says he gets his skin lesions but as far as he knows he never had any other complications.  Has a rash on the corner of his left eye that he just noticed in the last week or so.  He says is not itchy painful or bothersome in any way.  No new soaps or detergents.  He does not use any eyedrops..    Past Medical History:  Diagnosis Date  . BPH (benign prostatic hyperplasia) 07/20/2015  . NF (neurofibromatosis) ([place]) 07/20/2015    History reviewed. No pertinent surgical history.  Family History  Problem Relation Age of Onset  . Cancer Paternal Grandfather     Social History   Socioeconomic History  .  Marital status: Married    Spouse name: Not on file  . Number of children: Not on file  . Years of education: Not on file  . Highest education level: Not on file  Occupational History  . Not on file  Tobacco Use  . Smoking status: Never Smoker  . Smokeless tobacco: Never Used  Substance and Sexual Activity  . Alcohol use: Yes    Alcohol/week: 0.0 standard drinks  . Drug use: No  . Sexual activity: Not on file  Other Topics Concern  . Not on file  Social History Narrative  . Not on file   Social Determinants of Health   Financial Resource Strain:   . Difficulty of Paying Living Expenses: Not on file  Food Insecurity:   . Worried About Charity fundraiser in the Last Year: Not on file  . Ran Out of Food in the Last Year: Not on file  Transportation Needs:   . Lack of Transportation (Medical): Not on file  . Lack of  Transportation (Non-Medical): Not on file  Physical Activity:   . Days of Exercise per Week: Not on file  . Minutes of Exercise per Session: Not on file  Stress:   . Feeling of Stress : Not on file  Social Connections:   . Frequency of Communication with Friends and Family: Not on file  . Frequency of Social Gatherings with Friends and Family: Not on file  . Attends Religious Services: Not on file  . Active Member of Clubs or Organizations: Not on file  . Attends Archivist Meetings: Not on file  . Marital Status: Not on file  Intimate Partner Violence:   . Fear of Current or Ex-Partner: Not on file  . Emotionally Abused: Not on file  . Physically Abused: Not on file  . Sexually Abused: Not on file    Outpatient Medications Prior to Visit  Medication Sig Dispense Refill  . AMBULATORY NON FORMULARY MEDICATION Take by mouth daily. Medication Name: [place]    . cholecalciferol (VITAMIN D3) 25 MCG (1000 UNIT) tablet Take 1,000 Units by mouth daily.    Marland Kitchen loratadine (CLARITIN) 10 MG tablet Take 10 mg by mouth daily.    . Maca Root POWD Take by mouth daily.    . Misc Natural Products (JOINT SUPPORT PO) Take by mouth daily.    . benzonatate (TESSALON) 200 MG capsule Take 1 capsule (200 mg total) by mouth 3 (three) times daily as needed for cough. 45 capsule 0  . guaiFENesin-codeine 100-10 MG/5ML syrup Take 5 mLs by mouth at bedtime as needed for cough. 120 mL 0  . predniSONE (DELTASONE) 10 MG tablet Take 3 tablets (30 mg total) by mouth daily with breakfast. 15 tablet 0  . tamsulosin (FLOMAX) 0.4 MG CAPS capsule Take 1 capsule (0.4 mg total) by mouth daily. MUST MAKE APPOINTMENT 15 capsule 0   No facility-administered medications prior to visit.    No Known Allergies  Review of Systems     Objective:    Physical Exam Constitutional:      Appearance: He is well-developed.  HENT:     Head: Normocephalic and atraumatic.     Right Ear: External ear normal.     Left  Ear: External ear normal.     Nose: Nose normal.  Eyes:     Extraocular Movements: Extraocular movements intact.     Conjunctiva/sclera: Conjunctivae normal.     Pupils: Pupils are equal, round, and reactive to  light.     Comments: He does have a small erythematous rash in the corner of the left eye going towards the temple and a smaller red rash on the corner of the right eye going towards the temple as well.  Neck:     Thyroid: No thyromegaly.  Cardiovascular:     Rate and Rhythm: Normal rate and regular rhythm.     Heart sounds: Normal heart sounds.  Pulmonary:     Effort: Pulmonary effort is normal.     Breath sounds: Normal breath sounds.  Abdominal:     General: Bowel sounds are normal. There is no distension.     Palpations: Abdomen is soft. There is no mass.     Tenderness: There is no abdominal tenderness. There is no guarding or rebound.  Musculoskeletal:        General: Normal range of motion.     Cervical back: Normal range of motion and neck supple.  Lymphadenopathy:     Cervical: No cervical adenopathy.  Skin:    General: Skin is warm and dry.  Neurological:     General: No focal deficit present.     Mental Status: He is alert and oriented to person, place, and time.     Cranial Nerves: No cranial nerve deficit.     Motor: No weakness.     Gait: Gait normal.     Deep Tendon Reflexes: Reflexes are normal and symmetric. Reflexes normal.     Comments: Normal rapid alternating movements of the hands.  Heel-to-shin normal.  Gait was normal but when I had him walk heel-to-toe he had significant difficulty in doing so.  Otherwise though able to walk on his toes and walk backwards.  He started to sway slightly to the right with Romberg.  Psychiatric:        Behavior: Behavior normal.        Thought Content: Thought content normal.        Judgment: Judgment normal.     BP 129/67   Pulse (!) 115   Ht 6' (1.829 m)   Wt 176 lb (79.8 kg)   SpO2 98%   BMI 23.87 kg/m   Wt Readings from Last 3 Encounters:  03/04/19 176 lb (79.8 kg)  11/20/16 180 lb (81.6 kg)  06/26/16 185 lb (83.9 kg)    There are no preventive care reminders to display for this patient.  There are no preventive care reminders to display for this patient.   Lab Results  Component Value Date   TSH 2.50 07/26/2015   Lab Results  Component Value Date   WBC 6.1 07/26/2015   HGB 16.7 07/26/2015   HCT 48.5 07/26/2015   MCV 88.7 07/26/2015   PLT 266 07/26/2015   Lab Results  Component Value Date   NA 139 06/26/2016   K 4.8 06/26/2016   CO2 24 06/26/2016   GLUCOSE 92 06/26/2016   BUN 21 06/26/2016   CREATININE 0.81 06/26/2016   BILITOT 0.7 06/26/2016   ALKPHOS 65 06/26/2016   AST 16 06/26/2016   ALT 14 06/26/2016   PROT 7.2 06/26/2016   ALBUMIN 4.4 06/26/2016   CALCIUM 9.6 06/26/2016   Lab Results  Component Value Date   CHOL 175 07/26/2015   Lab Results  Component Value Date   HDL 60 07/26/2015   Lab Results  Component Value Date   LDLCALC 107 07/26/2015   Lab Results  Component Value Date   TRIG 41 07/26/2015   Lab Results  Component Value Date   CHOLHDL 2.9 07/26/2015   Lab Results  Component Value Date   HGBA1C 4.8 07/26/2015       Assessment & Plan:   Problem List Items Addressed This Visit      Nervous and Auditory   NF (neurofibromatosis) ([place])   Relevant Orders   CBC   COMPLETE METABOLIC PANEL WITH GFR   Lipid panel   TSH   Sedimentation rate   C-reactive protein   RPR   MR Brain W Wo Contrast    Other Visit Diagnoses    Impairment of cognitive function    -  Primary   Relevant Orders   CBC   COMPLETE METABOLIC PANEL WITH GFR   Lipid panel   TSH   Sedimentation rate   C-reactive protein   RPR   POCT URINALYSIS DIP (CLINITEK) (Completed)   MR Brain W Wo Contrast   Cerebellar ataxia in diseases classified elsewhere [facility])       Relevant Orders   MR Brain W Wo Contrast   Memory loss       Relevant Orders   MR Brain W Wo  Contrast   Rash       Relevant Medications   triamcinolone cream (KENALOG) 0.1 %   Other Relevant Orders   CBC   Fatigue, unspecified type         Impairment of cognitive function-unclear etiology it sounds like this occurred pretty abruptly about 3 months ago does not sound like its been a gradual decline.  His wife had noticed it initially but thought maybe he was just exhausted from working long hours.  But she purposely had them take a couple weeks of vacation to rest and sleep to see if he felt better.  But after the friend called yesterday she came extremely concerned and made the appointment today.  We discussed doing some additional lab work-up to rule out anemia, thyroid abnormalities, electrolyte disturbance, will check for RPR etc.  We will also go ahead and move forward with brain MRI with and without contrast to evaluate for possible mass, tumor, increase fluid/pressure etc..  I am concerned about a potential cerebellar issue.  If everything is normal and reassuring then consider formal consultation for neurocognitive testing.  FAtigue -evaluate for anemia and thyroid disorder.  Neurofibromatosis-unclear if he has type I or type II.  But certainly that could put him at risk for a vestibular schwannoma or a meningioma of the spine.  Depending on imaging results consider referral/consultation with neurology.  Meds ordered this encounter  Medications  . triamcinolone cream (KENALOG) 0.1 %    Sig: Apply 1 application topically at bedtime as needed.    Dispense:  15 g    Refill:  1     Beatrice Lecher, MD

## 2019-03-05 LAB — COMPLETE METABOLIC PANEL WITH GFR
AG Ratio: 1.7 (calc) (ref 1.0–2.5)
ALT: 15 U/L (ref 9–46)
AST: 12 U/L (ref 10–40)
Albumin: 4.5 g/dL (ref 3.6–5.1)
Alkaline phosphatase (APISO): 61 U/L (ref 36–130)
BUN: 16 mg/dL (ref 7–25)
CO2: 30 mmol/L (ref 20–32)
Calcium: 10.1 mg/dL (ref 8.6–10.3)
Chloride: 100 mmol/L (ref 98–110)
Creat: 0.83 mg/dL (ref 0.60–1.35)
GFR, Est African American: 123 mL/min/{1.73_m2} (ref >=60)
GFR, Est Non African American: 106 mL/min/{1.73_m2} (ref >=60)
Globulin: 2.7 g/dL (calc) (ref 1.9–3.7)
Glucose, Bld: 90 mg/dL (ref 65–99)
Potassium: 4.7 mmol/L (ref 3.5–5.3)
Sodium: 138 mmol/L (ref 135–146)
Total Bilirubin: 0.9 mg/dL (ref 0.2–1.2)
Total Protein: 7.2 g/dL (ref 6.1–8.1)

## 2019-03-05 LAB — CBC
HCT: 49.3 % (ref 38.5–50.0)
Hemoglobin: 17 g/dL (ref 13.2–17.1)
MCH: 29.7 pg (ref 27.0–33.0)
MCHC: 34.5 g/dL (ref 32.0–36.0)
MCV: 86 fL (ref 80.0–100.0)
MPV: 10 fL (ref 7.5–12.5)
Platelets: 280 10*3/uL (ref 140–400)
RBC: 5.73 10*6/uL (ref 4.20–5.80)
RDW: 11.7 % (ref 11.0–15.0)
WBC: 6.1 10*3/uL (ref 3.8–10.8)

## 2019-03-05 LAB — TSH: TSH: 2 mIU/L (ref 0.40–4.50)

## 2019-03-05 LAB — SEDIMENTATION RATE: Sed Rate: 2 mm/h (ref 0–15)

## 2019-03-05 LAB — LIPID PANEL
Cholesterol: 187 mg/dL (ref <200)
HDL: 55 mg/dL (ref >=40)
LDL Cholesterol (Calc): 117 mg/dL (calc) — ABNORMAL HIGH
Non-HDL Cholesterol (Calc): 132 mg/dL (calc) — ABNORMAL HIGH (ref <130)
Total CHOL/HDL Ratio: 3.4 (calc) (ref <5.0)
Triglycerides: 54 mg/dL (ref <150)

## 2019-03-05 LAB — RPR: RPR Ser Ql: NONREACTIVE

## 2019-03-05 LAB — C-REACTIVE PROTEIN: CRP: 1.4 mg/L (ref <8.0)

## 2019-03-08 ENCOUNTER — Telehealth: Payer: Self-pay | Admitting: Family Medicine

## 2019-03-08 ENCOUNTER — Other Ambulatory Visit: Payer: Self-pay

## 2019-03-08 ENCOUNTER — Ambulatory Visit (INDEPENDENT_AMBULATORY_CARE_PROVIDER_SITE_OTHER): Payer: BC Managed Care – PPO

## 2019-03-08 DIAGNOSIS — G3281 Cerebellar ataxia in diseases classified elsewhere: Secondary | ICD-10-CM

## 2019-03-08 DIAGNOSIS — G919 Hydrocephalus, unspecified: Secondary | ICD-10-CM

## 2019-03-08 DIAGNOSIS — R413 Other amnesia: Secondary | ICD-10-CM | POA: Diagnosis not present

## 2019-03-08 DIAGNOSIS — R4189 Other symptoms and signs involving cognitive functions and awareness: Secondary | ICD-10-CM

## 2019-03-08 DIAGNOSIS — G9389 Other specified disorders of brain: Secondary | ICD-10-CM

## 2019-03-08 DIAGNOSIS — Q8500 Neurofibromatosis, unspecified: Secondary | ICD-10-CM | POA: Diagnosis not present

## 2019-03-08 DIAGNOSIS — R220 Localized swelling, mass and lump, head: Secondary | ICD-10-CM | POA: Diagnosis not present

## 2019-03-08 IMAGING — MR MR HEAD WO/W CM
13 of 14 series · 41 of 48 positions shown · IV contrast (8 ML GADAVIST)
Comparison: None.
COMPARISON: None.

Addendum:
CLINICAL DATA: Impairment of cognitive function. Cerebellar ataxia.
Neurofibromatosis.

EXAM:
MRI HEAD WITHOUT AND WITH CONTRAST
TECHNIQUE: Multiplanar, multiecho pulse sequences of the brain and surrounding
structures were obtained without and with intravenous contrast.
CONTRAST:  8 mL Gadovist IV

[Series 4: DWI · axial · 3.0mm · 1.20mm/px · z∈[-68,+77]mm · 4 of 55 slices shown (1 of 2)]
[im 1/55]
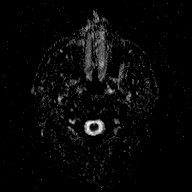
[im 19/55]
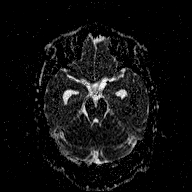
[im 37/55]
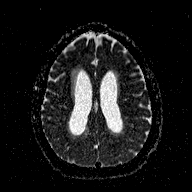
[im 55/55]
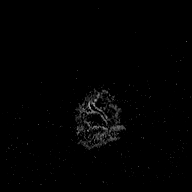

[Series 6: DWI · coronal · 3.0mm · 1.15mm/px · 4 of 47 slices shown (2 of 2)]
[im 1/47]
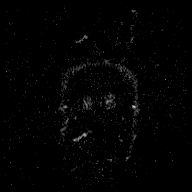
[im 16/47]
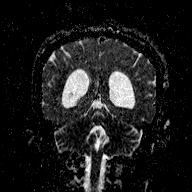
[im 31/47]
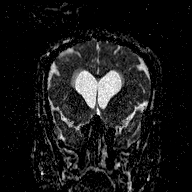
[im 47/47]
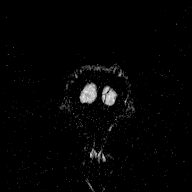

[Series 7: T1 · sagittal · 5.0mm · 0.45mm/px · 2 of 23 slices shown (1 of 2)]
[im 1/23]
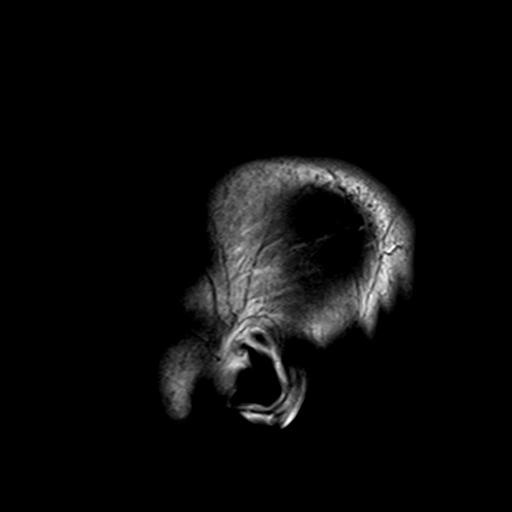
[im 23/23]
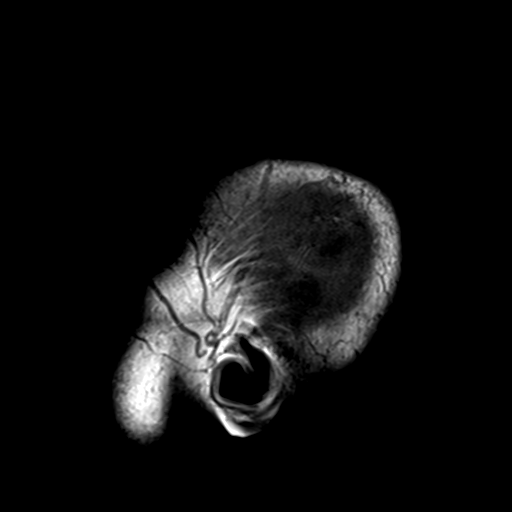

[Series 8: T2 · axial · 5.0mm · 0.72mm/px · 1 of 23 slices shown (1 of 2)]
[im 1/23]
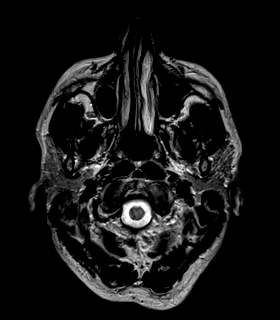

[Series 9: FLAIR · axial · 3.0mm · 0.45mm/px · z∈[-73,+75]mm · 3 of 55 slices shown (1 of 2)]
[im 1/55]
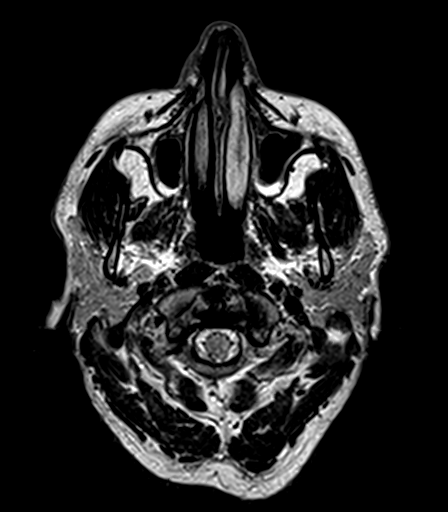
[im 28/55]
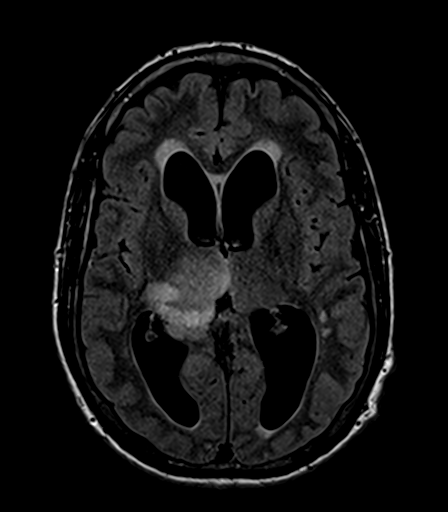
[im 55/55]
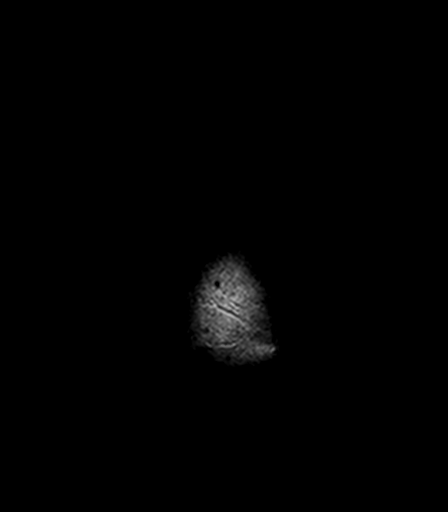

[Series 10: T2 · axial · 5.0mm · 0.72mm/px · 1 of 23 slices shown (2 of 2)]
[im 1/23]
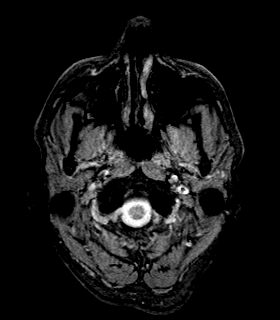

[Series 11: T1 · axial · 1.0mm · 1.00mm/px · z∈[-66,+79]mm · 8 of 160 slices shown (2 of 2)]
[im 1/160]
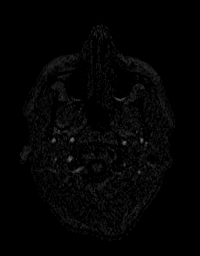
[im 18/160]
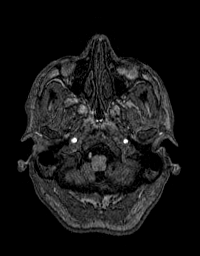
[im 54/160]
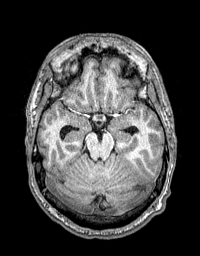
[im 71/160]
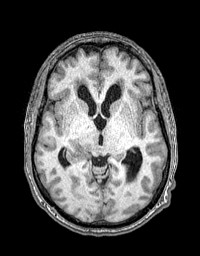
[im 89/160]
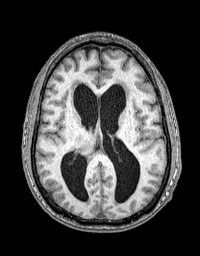
[im 107/160]
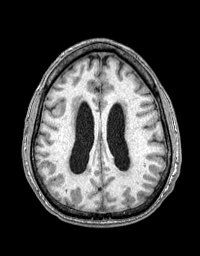
[im 142/160]
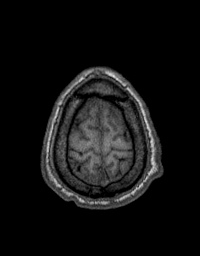
[im 160/160]
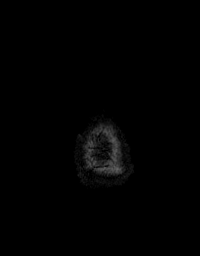

[Series 12: FLAIR · sagittal · 4.0mm · 0.45mm/px · 2 of 30 slices shown (2 of 2)]
[im 1/30]
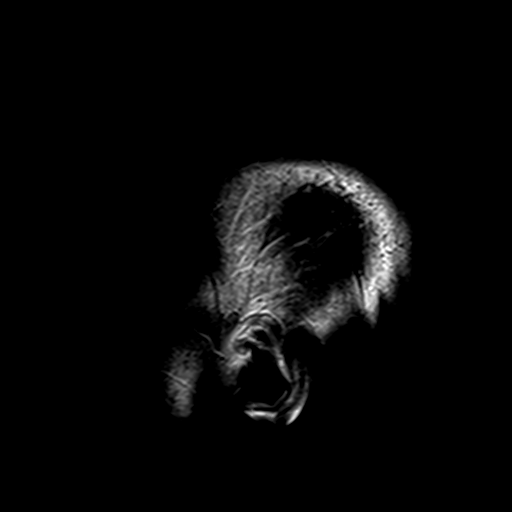
[im 30/30]
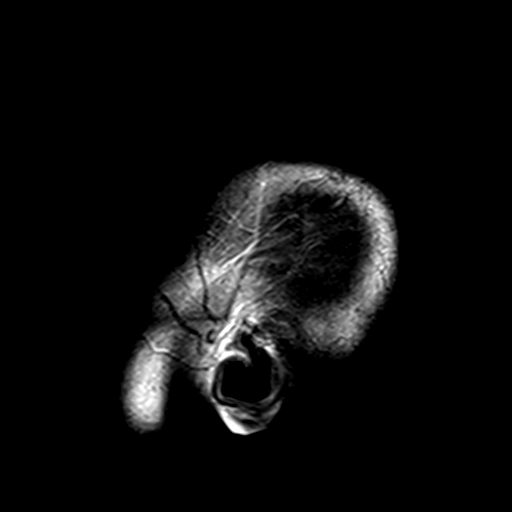

[Series 13: T2 post-contrast · coronal · 5.0mm · 0.43mm/px · 2 of 30 slices shown]
[im 1/30]
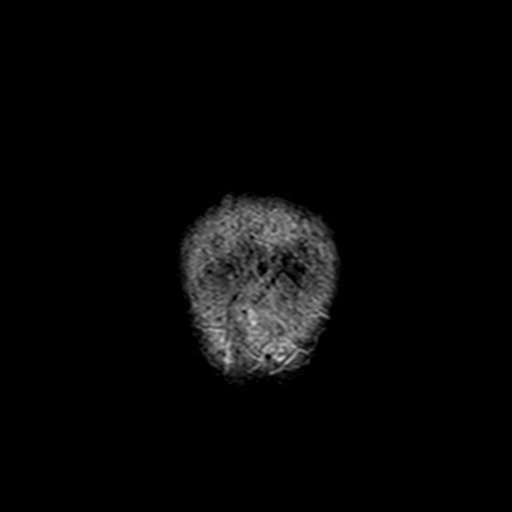
[im 30/30]
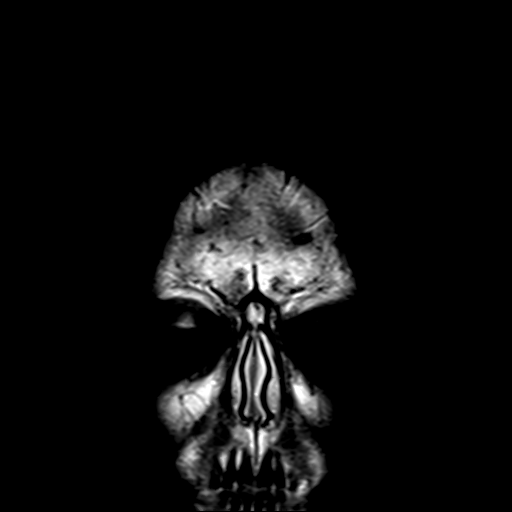

[Series 14: T1 post-contrast · axial · 1.0mm · 1.00mm/px · z∈[-66,+79]mm · 8 of 160 slices shown (1 of 3)]
[im 1/160]
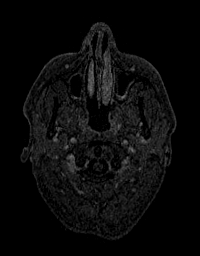
[im 18/160]
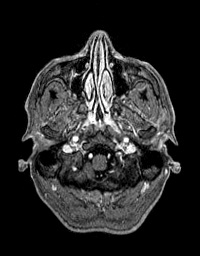
[im 54/160]
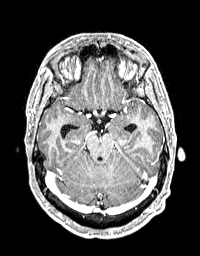
[im 71/160]
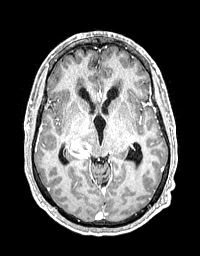
[im 89/160]
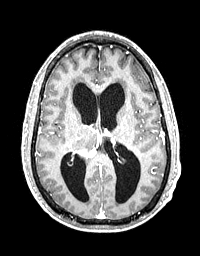
[im 107/160]
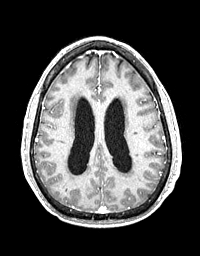
[im 142/160]
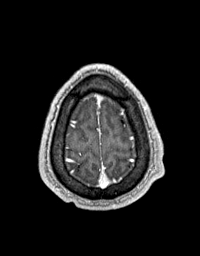
[im 160/160]
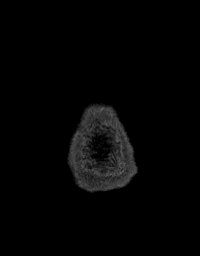

[Series 15: T1 post-contrast · coronal · 5.0mm · 0.43mm/px · 2 of 30 slices shown (2 of 3)]
[im 1/30]
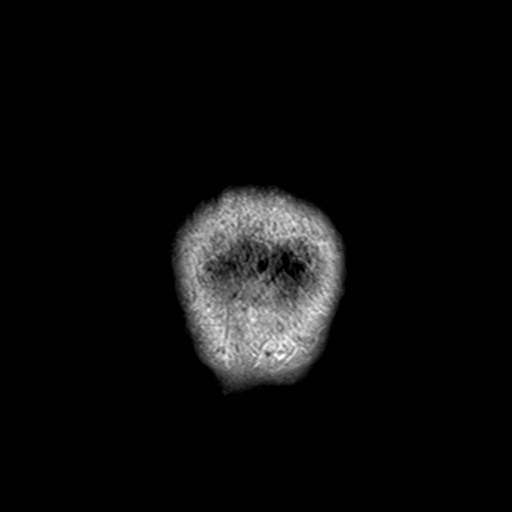
[im 30/30]
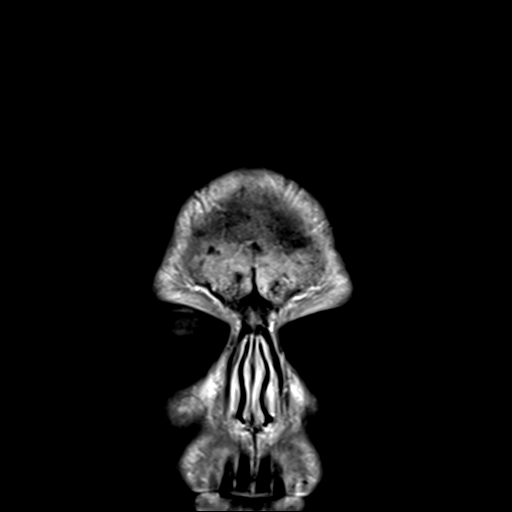

[Series 16: T1 post-contrast · sagittal · 5.0mm · 0.45mm/px · 1 of 23 slices shown (3 of 3)]
[im 1/23]
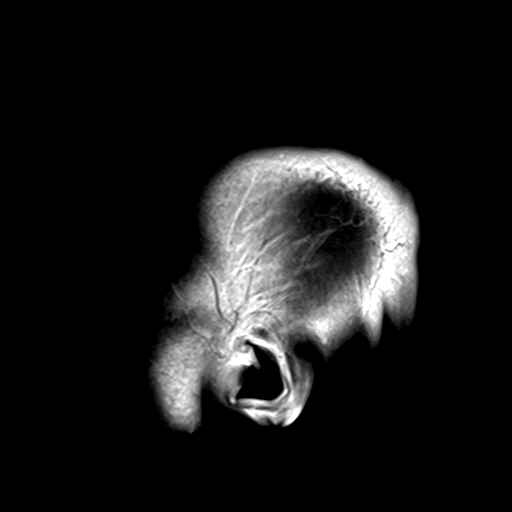

[Series 100: (id) ax · axial · 3.0mm · 1.20mm/px · z∈[-68,+77]mm · 3 of 55 slices shown]
[im 1/55]
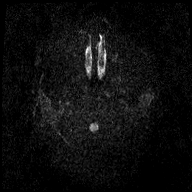
[im 28/55]
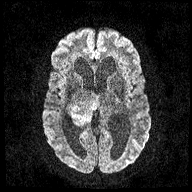
[im 55/55]
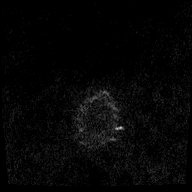

[41 of 48 positions shown; findings below may reference images not displayed]

FINDINGS: Brain: Obstructive hydrocephalus. Dilatation of lateral and third
ventricles due to mass lesion in the right thalamus and midbrain.
Ventricles are rounded and dilated with periventricular resorption
of CSF in the white matter bilaterally. Enhancing mass lesion
involving the right thalamus extending into the midbrain on the
right. There is displacement and compression of the aqueduct causing
hydrocephalus. The mass shows patchy peripheral enhancement and
measures approximately 3.7 x 2.7 x 2.9 cm. No associated hemorrhage.
The mass shows facilitated diffusion. No other mass lesion
identified. No acute infarct

Vascular: Normal arterial flow voids

Skull and upper cervical spine: No focal skeletal lesion. Sphenoid
bone appears normal and symmetric bilaterally.

Sinuses/Orbits: Mild mucosal edema paranasal sinuses. Negative
orbit.

Other: None
IMPRESSION: Obstructive hydrocephalus due to a mass lesion in the right thalamus
and right midbrain. Mass shows patchy peripheral enhancement. Given
the history of neurofibromatosis, this is likely a glioma.

ADDENDUM:
These results were called by telephone at the time of interpretation
on [DATE] at [DATE] to provider CHARLONN , who
verbally acknowledged these results.

*** End of Addendum ***
FINDINGS: Brain: Obstructive hydrocephalus. Dilatation of lateral and third
ventricles due to mass lesion in the right thalamus and midbrain.
Ventricles are rounded and dilated with periventricular resorption
of CSF in the white matter bilaterally. Enhancing mass lesion
involving the right thalamus extending into the midbrain on the
right. There is displacement and compression of the aqueduct causing
hydrocephalus. The mass shows patchy peripheral enhancement and
measures approximately 3.7 x 2.7 x 2.9 cm. No associated hemorrhage.
The mass shows facilitated diffusion. No other mass lesion
identified. No acute infarct

Vascular: Normal arterial flow voids

Skull and upper cervical spine: No focal skeletal lesion. Sphenoid
bone appears normal and symmetric bilaterally.

Sinuses/Orbits: Mild mucosal edema paranasal sinuses. Negative
orbit.

Other: None
IMPRESSION: Obstructive hydrocephalus due to a mass lesion in the right thalamus
and right midbrain. Mass shows patchy peripheral enhancement. Given
the history of neurofibromatosis, this is likely a glioma.

## 2019-03-08 MED ORDER — GADOBUTROL 1 MMOL/ML IV SOLN
8.0000 mL | Freq: Once | INTRAVENOUS | Status: AC | PRN
  Administered 2019-03-08: 10 mL via INTRAVENOUS

## 2019-03-08 NOTE — Telephone Encounter (Signed)
Received stat call from radiologist regarding MRI results of the brain showing brain tumor possible glioma as well as some hydrocephalus.  Patient was held in the imaging department.  Sat down and consulted with him and his wife in the imaging department and went over the results including the fact that he is not allowed to drive.  Hole placed at neurosurgery referral and get this done ASAP so we can try to get him in this week if at all possible.  He does have a history of neurofibromatosis.  So not sure if this tumor is related but certainly is highly suspicious.  Beatrice Lecher, MD

## 2019-03-09 ENCOUNTER — Other Ambulatory Visit: Payer: Self-pay | Admitting: Radiation Therapy

## 2019-03-09 ENCOUNTER — Ambulatory Visit: Payer: BC Managed Care – PPO | Admitting: Family Medicine

## 2019-03-09 ENCOUNTER — Other Ambulatory Visit: Payer: Self-pay | Admitting: Neurosurgery

## 2019-03-09 DIAGNOSIS — G919 Hydrocephalus, unspecified: Secondary | ICD-10-CM | POA: Diagnosis not present

## 2019-03-09 DIAGNOSIS — G9389 Other specified disorders of brain: Secondary | ICD-10-CM | POA: Diagnosis not present

## 2019-03-10 ENCOUNTER — Other Ambulatory Visit: Payer: Self-pay | Admitting: Neurosurgery

## 2019-03-11 ENCOUNTER — Encounter: Payer: Self-pay | Admitting: Family Medicine

## 2019-03-12 ENCOUNTER — Encounter (HOSPITAL_COMMUNITY): Payer: Self-pay

## 2019-03-12 NOTE — Progress Notes (Signed)
[id number] [id number]: [phone]: [phone]     Your procedure is scheduled on Wednesday, February 10th, 2021.  [facility] at 6:35 A.M., and check in at the Admitting office.  Call this number if you have problems the morning of surgery:  [phone] Call [phone] you have any questions prior to your surgery date Monday-Friday 8am-4pm    Remember:  Do not eat or drink after midnight the night before your surgery    Take ONLY these medicines the morning of surgery with A SIP OF WATER :  Loratatine (Claritin)  From this point forward, prior to surgery, STOP taking any Aspirin (unless otherwise instructed by your surgeon), Aleve, Naproxen, Ibuprofen, Motrin, Advil, Goody's, BC's, all herbal medications, fish oil, and all vitamins.    The Morning of Surgery  Do not wear jewelry.  Do not wear lotions, powders, colognes, or deodorant  Do not shave 48 hours prior to surgery.  Men may shave face and neck.  Do not bring valuables to the hospital.  [facility] belongings or [facility].  If you are a smoker, DO NOT Smoke 24 hours prior to surgery  If you wear a CPAP at night please bring your mask the morning of surgery   Remember that you must have someone to transport you home after your surgery, and remain with you for 24 hours if you are discharged the same day.   Please bring cases for contacts, glasses, hearing aids, dentures or bridgework because it cannot be worn into surgery.    Leave your suitcase in the car.  After surgery it may be brought to your room.  For patients admitted to the hospital, discharge time will be determined by your treatment team.  Patients discharged the day of surgery will not be allowed to drive home.    Special instructions:   [facility]- Preparing For Surgery  Before  surgery, you can play an important role. Because skin is not sterile, your skin needs to be as free of germs as possible. You can reduce the number of germs on your skin by washing with CHG (chlorahexidine gluconate) Soap before surgery.  CHG is an antiseptic cleaner which kills germs and bonds with the skin to continue killing germs even after washing.    Oral Hygiene is also important to reduce your risk of infection.  Remember - BRUSH YOUR TEETH THE MORNING OF SURGERY WITH YOUR REGULAR TOOTHPASTE  Please do not use if you have an allergy to CHG or antibacterial soaps. If your skin becomes reddened/irritated stop using the CHG.  Do not shave (including legs and underarms) for at least 48 hours prior to first CHG shower. It is OK to shave your face.  Please follow these instructions carefully.   1. Shower the NIGHT BEFORE SURGERY and the MORNING OF SURGERY with CHG Soap.   2. If you chose to wash your hair, wash your hair first as usual with your normal shampoo.  3. After you shampoo, rinse your hair and body thoroughly to remove the shampoo.  4. Use CHG as you would any other liquid soap. You can apply CHG directly to the skin and wash gently with a scrungie or a clean washcloth.   5. Apply the CHG Soap to your body ONLY FROM THE NECK DOWN.  Do not use on open wounds  or open sores. Avoid contact with your eyes, ears, mouth and genitals (private parts). Wash Face and genitals (private parts)  with your normal soap.   6. Wash thoroughly, paying special attention to the area where your surgery will be performed.  7. Thoroughly rinse your body with warm water from the neck down.  8. DO NOT shower/wash with your normal soap after using and rinsing off the CHG Soap.  9. Pat yourself dry with a CLEAN TOWEL.  10. Wear CLEAN PAJAMAS to bed the night before surgery, wear comfortable clothes the morning of surgery  11. Place CLEAN SHEETS on your bed the night of your first shower and DO NOT SLEEP  WITH PETS.    Day of Surgery:  Please shower the morning of surgery with the CHG soap Do not apply any deodorants/lotions. Please wear clean clothes to the hospital/surgery center.   Remember to brush your teeth WITH YOUR REGULAR TOOTHPASTE.   Please read over the following fact sheets that you were given.

## 2019-03-12 NOTE — Telephone Encounter (Signed)
OK for work note to be out until 2/15 which is after his surgery and then the neurosurg can better determine how long he needs to be out after that.

## 2019-03-15 ENCOUNTER — Encounter (HOSPITAL_COMMUNITY)
Admission: RE | Admit: 2019-03-15 | Discharge: 2019-03-15 | Disposition: A | Discharge status: Home / Self Care | Payer: BC Managed Care – PPO | Source: Ambulatory Visit | Attending: Neurosurgery | Admitting: Neurosurgery

## 2019-03-15 ENCOUNTER — Other Ambulatory Visit (HOSPITAL_COMMUNITY): Payer: Self-pay | Admitting: Neurosurgery

## 2019-03-15 ENCOUNTER — Other Ambulatory Visit (HOSPITAL_COMMUNITY)
Admission: RE | Admit: 2019-03-15 | Discharge: 2019-03-15 | Disposition: A | Discharge status: Home / Self Care | Payer: BC Managed Care – PPO | Source: Ambulatory Visit | Attending: Neurosurgery | Admitting: Neurosurgery

## 2019-03-15 ENCOUNTER — Encounter (HOSPITAL_COMMUNITY): Payer: Self-pay

## 2019-03-15 ENCOUNTER — Other Ambulatory Visit: Payer: Self-pay

## 2019-03-15 ENCOUNTER — Inpatient Hospital Stay: Payer: BC Managed Care – PPO

## 2019-03-15 DIAGNOSIS — G9389 Other specified disorders of brain: Secondary | ICD-10-CM

## 2019-03-15 DIAGNOSIS — Q8500 Neurofibromatosis, unspecified: Secondary | ICD-10-CM

## 2019-03-15 DIAGNOSIS — Z01812 Encounter for preprocedural laboratory examination: Secondary | ICD-10-CM | POA: Insufficient documentation

## 2019-03-15 HISTORY — DX: Headache, unspecified: R51.9

## 2019-03-15 HISTORY — DX: Other specified disorders of brain: G93.89

## 2019-03-15 LAB — SARS CORONAVIRUS 2 (TAT 6-24 HRS): SARS Coronavirus 2: NEGATIVE

## 2019-03-15 NOTE — Progress Notes (Signed)
Patient denies shortness of breath, fever, cough and chest pain.  PCP - Dr Beatrice Lecher Cardiologist - denies  Chest x-ray - denies EKG - denies Stress Test - denies ECHO - denies Cardiac Cath - denies  Anesthesia review: No  Coronavirus Screening Have you experienced the following symptoms:  Cough yes/no: No Fever (>100.64F)  yes/no: No Runny nose yes/no: No Sore throat yes/no: No Difficulty breathing/shortness of breath  yes/no: No  Have you traveled in the last 14 days and where? yes/no: No  Patient verbalized understanding of instructions that were given to them at the PAT appointment.

## 2019-03-16 ENCOUNTER — Ambulatory Visit (HOSPITAL_COMMUNITY)
Admission: RE | Admit: 2019-03-16 | Discharge: 2019-03-16 | Disposition: A | Discharge status: Home / Self Care | Payer: BC Managed Care – PPO | Source: Ambulatory Visit | Attending: Neurosurgery | Admitting: Neurosurgery

## 2019-03-16 DIAGNOSIS — G919 Hydrocephalus, unspecified: Secondary | ICD-10-CM | POA: Diagnosis not present

## 2019-03-16 DIAGNOSIS — R220 Localized swelling, mass and lump, head: Secondary | ICD-10-CM | POA: Diagnosis not present

## 2019-03-16 DIAGNOSIS — N529 Male erectile dysfunction, unspecified: Secondary | ICD-10-CM | POA: Diagnosis not present

## 2019-03-16 DIAGNOSIS — D496 Neoplasm of unspecified behavior of brain: Secondary | ICD-10-CM | POA: Diagnosis not present

## 2019-03-16 DIAGNOSIS — Z87891 Personal history of nicotine dependence: Secondary | ICD-10-CM | POA: Diagnosis not present

## 2019-03-16 DIAGNOSIS — G911 Obstructive hydrocephalus: Secondary | ICD-10-CM | POA: Diagnosis not present

## 2019-03-16 DIAGNOSIS — G9389 Other specified disorders of brain: Secondary | ICD-10-CM

## 2019-03-16 DIAGNOSIS — C719 Malignant neoplasm of brain, unspecified: Secondary | ICD-10-CM | POA: Diagnosis not present

## 2019-03-16 DIAGNOSIS — G939 Disorder of brain, unspecified: Secondary | ICD-10-CM | POA: Diagnosis not present

## 2019-03-16 DIAGNOSIS — Q8501 Neurofibromatosis, type 1: Secondary | ICD-10-CM | POA: Diagnosis not present

## 2019-03-16 DIAGNOSIS — Z79899 Other long term (current) drug therapy: Secondary | ICD-10-CM | POA: Diagnosis not present

## 2019-03-16 DIAGNOSIS — Q8500 Neurofibromatosis, unspecified: Secondary | ICD-10-CM | POA: Diagnosis not present

## 2019-03-16 DIAGNOSIS — D1801 Hemangioma of skin and subcutaneous tissue: Secondary | ICD-10-CM | POA: Diagnosis not present

## 2019-03-16 DIAGNOSIS — M5126 Other intervertebral disc displacement, lumbar region: Secondary | ICD-10-CM | POA: Diagnosis not present

## 2019-03-16 IMAGING — MR MR HEAD WO/W CM
12 of 14 series · 40 of 48 positions shown · IV contrast (7.5 gad)
Comparison: MRI of the brain [DATE]

CLINICAL DATA: Brain mass. Stereotactic protocol.

EXAM:
MRI HEAD WITHOUT AND WITH CONTRAST
TECHNIQUE: Multiplanar, multiecho pulse sequences of the brain and surrounding
structures were obtained without and with intravenous contrast.
CONTRAST:  7.5mL GADAVIST GADOBUTROL 1 MMOL/ML IV SOLN

[Series 2: FLAIR · sagittal · 3.0mm · 0.47mm/px · 1 of 45 slices shown (1 of 2)]
[im 1/45]
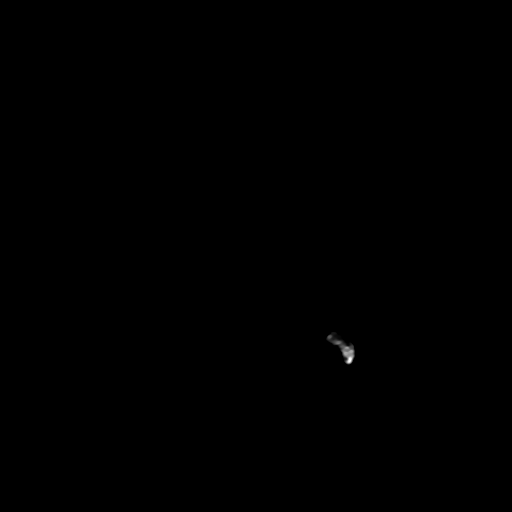

[Series 3: DWI · axial · 3.0mm · 0.94mm/px · z∈[-40,+122]mm · 2 of 110 slices shown]
[im 1/110]
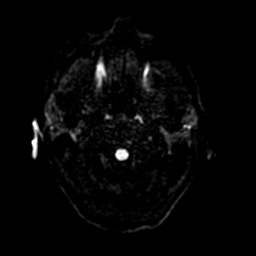
[im 110/110]
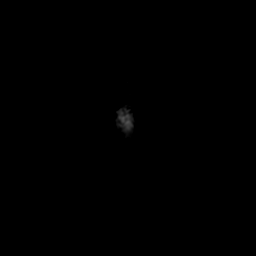

[Series 4: ax dti · axial · 3.0mm · 0.94mm/px · z∈[-54,+108]mm · 26 of 1482 slices shown]
[im 1/1482]
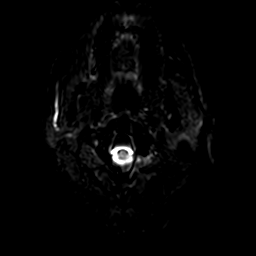
[im 55/1482]
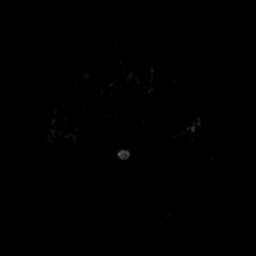
[im 110/1482]
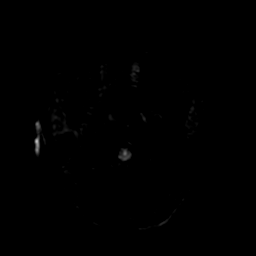
[im 165/1482]
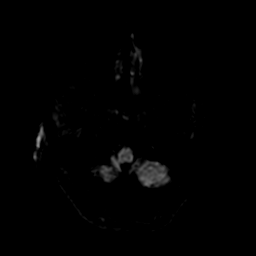
[im 220/1482]
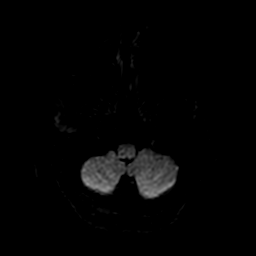
[im 275/1482]
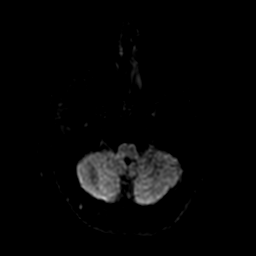
[im 330/1482]
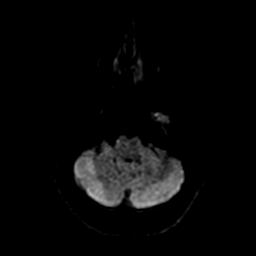
[im 384/1482]
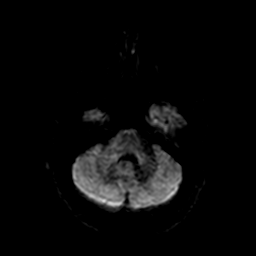
[im 439/1482]
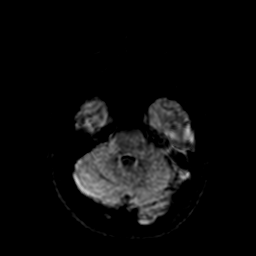
[im 494/1482]
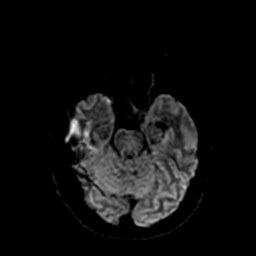
[im 549/1482]
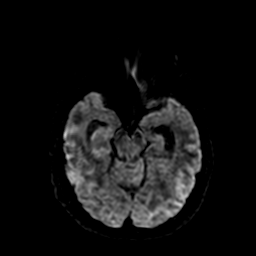
[im 604/1482]
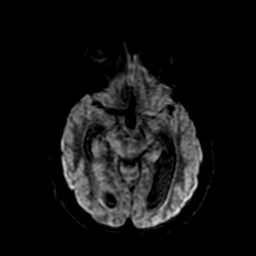
[im 659/1482]
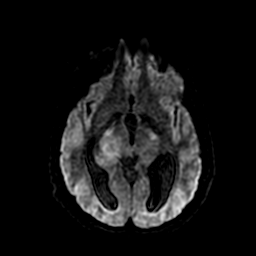
[im 714/1482]
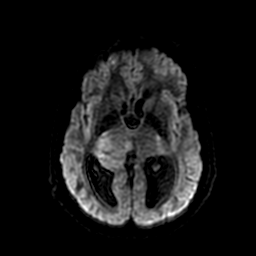
[im 768/1482]
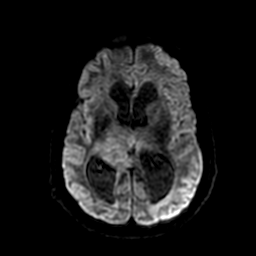
[im 823/1482]
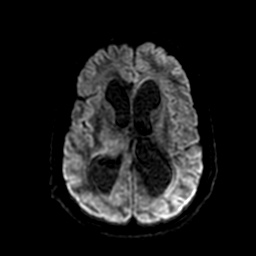
[im 878/1482]
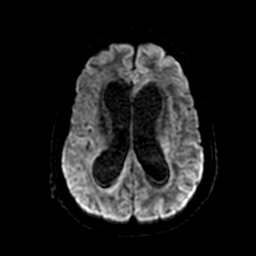
[im 933/1482]
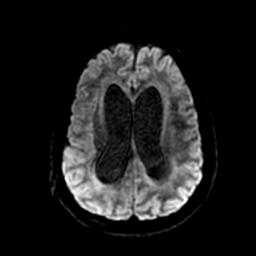
[im 988/1482]
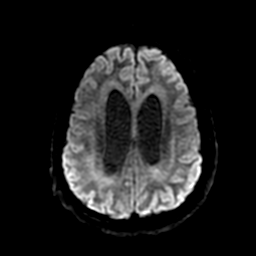
[im 1043/1482]
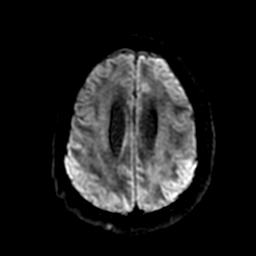
[im 1098/1482]
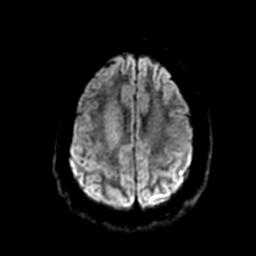
[im 1152/1482]
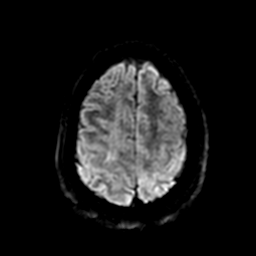
[im 1207/1482]
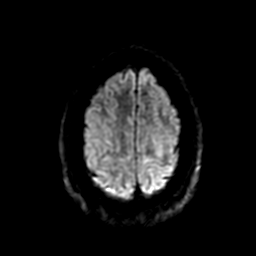
[im 1262/1482]
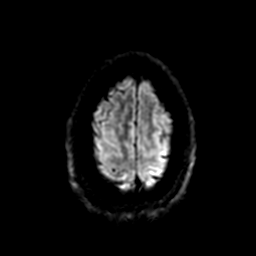
[im 1317/1482]
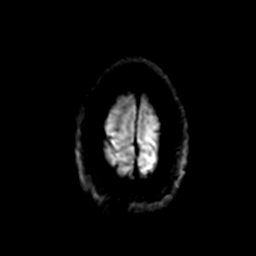
[im 1427/1482]
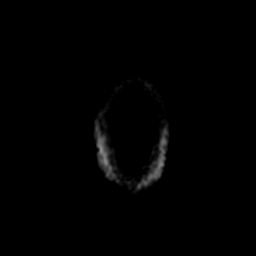

[Series 5: T2 · axial · 5.0mm · 0.47mm/px · 1 of 29 slices shown]
[im 1/29]
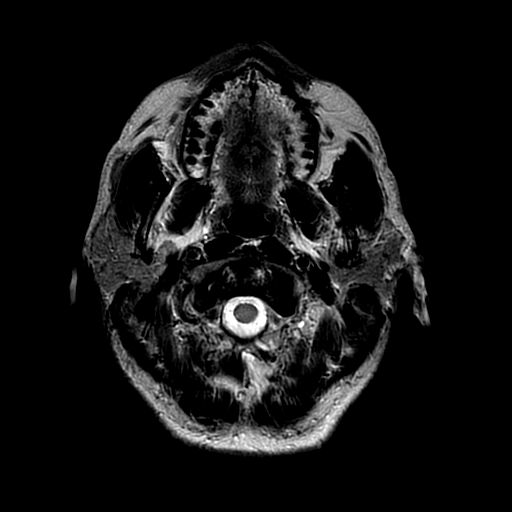

[Series 6: FLAIR · axial · 3.0mm · 0.47mm/px · 1 of 56 slices shown (2 of 2)]
[im 1/56]
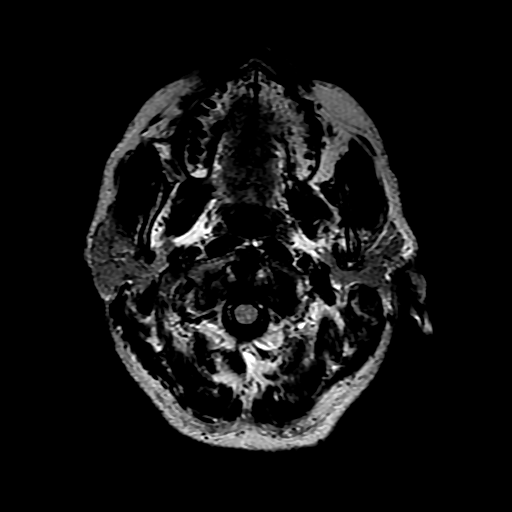

[Series 7: SWI · axial · 3.0mm · 0.47mm/px · z∈[-58,+121]mm · 2 of 120 slices shown (1 of 2)]
[im 1/120]
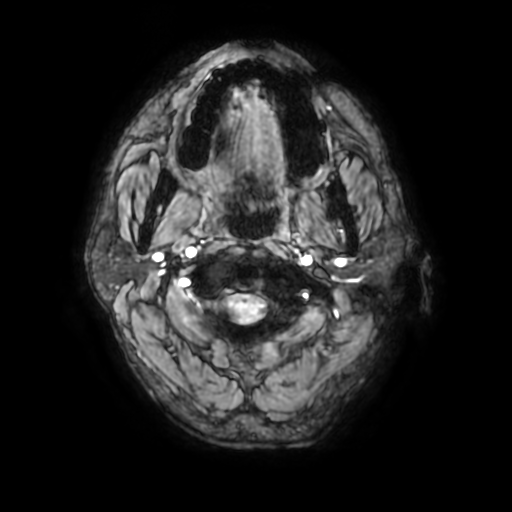
[im 120/120]
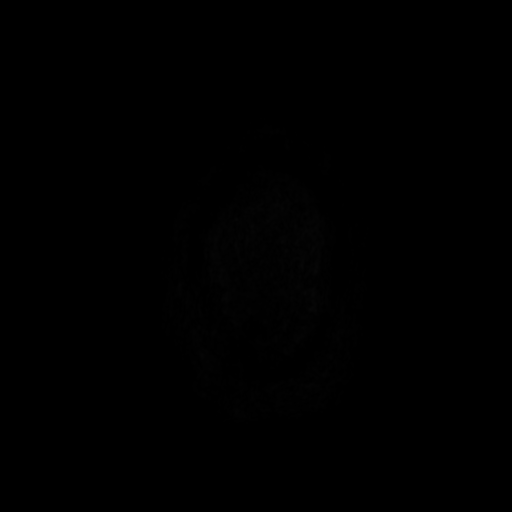

[Series 9: T2 post-contrast · coronal · 3.0mm · 0.39mm/px · 1 of 47 slices shown]
[im 1/47]
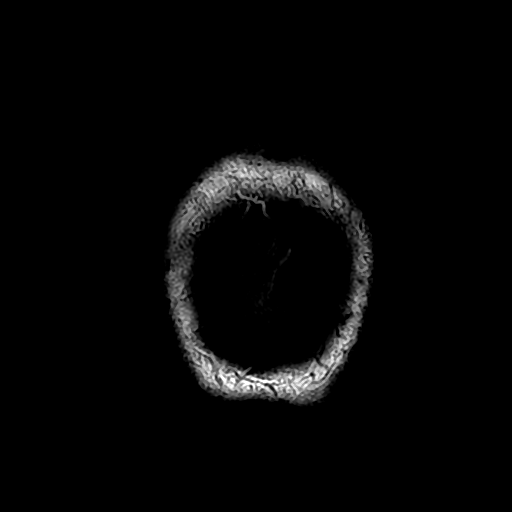

[Series 11: T1 post-contrast · coronal · 3.0mm · 0.39mm/px · 1 of 47 slices shown]
[im 1/47]
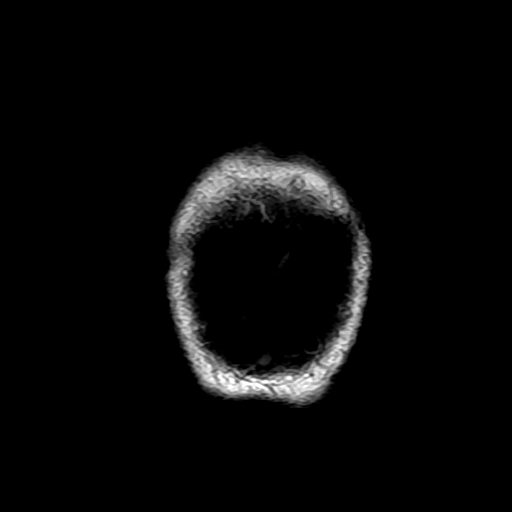

[Series 12: FLAIR post-contrast · sagittal · 3.0mm · 0.47mm/px · 1 of 45 slices shown]
[im 1/45]
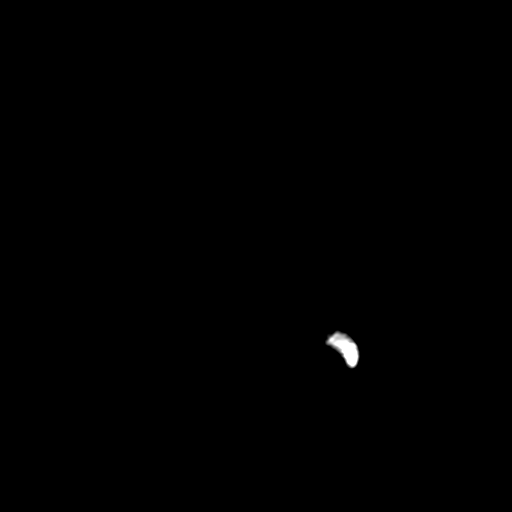

[Series 350: ADC · axial · 3.0mm · 0.94mm/px · 1 of 55 slices shown]
[im 1/55]
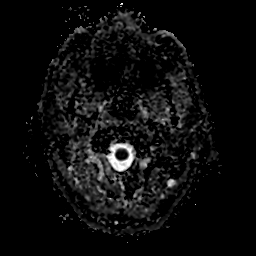

[Series 450: trace · axial · 3.0mm · 0.94mm/px · 1 of 57 slices shown]
[im 1/57]
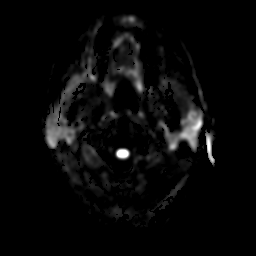

[Series 700: SWI · axial · 3.0mm · 0.47mm/px · z∈[-58,+118]mm · 2 of 118 slices shown (2 of 2)]
[im 1/118]
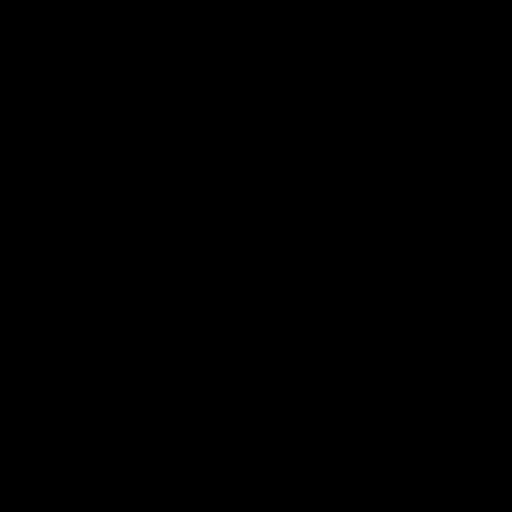
[im 118/118]
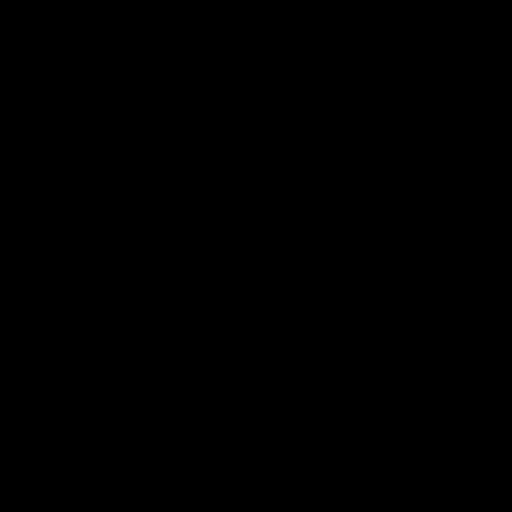

[40 of 48 positions shown; findings below may reference images not displayed]

FINDINGS: Brain: Again seen is a mass lesion centered on the right thalamus
with ill defined margins extending inferiorly into the midbrain
involving the cerebral aqueduct and posterior aspect of the third
ventricle bilaterally, with mass effect resulting in obstructive
hydrocephalus. There is also increased T2 signal in the fornices and
hippocampi. The lesion shows patchy peripheral contrast enhancement
which extends to the walls of the third ventricle and meeting the
superior aspect of the cerebral aqueduct, suggesting subependymal
spread. No restricted diffusion noted. The lesion measures
approximately 4.6 by 3.7 by 3.2 cm (AP, T, cc), unchanged from
prior. Periventricular T2 hyperintensity likely representing
transependymal edema is unchanged.

Vascular: Normal flow voids.

Skull and upper cervical spine: Normal marrow signal.

Sinuses/Orbits: Negative.

Other: None.
IMPRESSION: 1. Unchanged appearance of mass centered on the right thalamus with
extension into the midbrain involving the cerebral aqueduct and
posterior aspect of the third ventricle with evidence of
subependymal spread.
2. Unchanged obstructive hydrocephalus with periventricular T2
hyperintensity likely representing transependymal edema.

## 2019-03-16 MED ORDER — GADOBUTROL 1 MMOL/ML IV SOLN
7.5000 mL | Freq: Once | INTRAVENOUS | Status: AC | PRN
  Administered 2019-03-16: 16:00:00 7.5 mL via INTRAVENOUS

## 2019-03-16 NOTE — Anesthesia Preprocedure Evaluation (Addendum)
Anesthesia Evaluation  Patient identified by MRN, date of birth, ID band Patient awake    Reviewed: Allergy & Precautions, NPO status , Patient's Chart, lab work & pertinent test results  History of Anesthesia Complications Negative for: history of anesthetic complications  Airway Mallampati: II  TM Distance: >3 FB Neck ROM: Full    Dental  (+) Teeth Intact, Dental Advisory Given   Pulmonary former smoker,  03/15/2019 SARS coronavirus NEG   breath sounds clear to auscultation       Cardiovascular (-) hypertension(-) anginanegative cardio ROS   Rhythm:Regular Rate:Normal     Neuro/Psych  Headaches, Neurofibromatosis Thalamic Brain mass with obstructive hydrocephalus    GI/Hepatic negative GI ROS, Neg liver ROS,   Endo/Other  negative endocrine ROS  Renal/GU negative Renal ROS     Musculoskeletal   Abdominal   Peds  Hematology negative hematology ROS (+)   Anesthesia Other Findings   Reproductive/Obstetrics                            Anesthesia Physical Anesthesia Plan  ASA: III  Anesthesia Plan: General   Post-op Pain Management:    Induction: Intravenous  PONV Risk Score and Plan: 2 and Ondansetron and Dexamethasone  Airway Management Planned: Oral ETT  Additional Equipment: Arterial line  Intra-op Plan:   Post-operative Plan: Extubation in OR  Informed Consent: I have reviewed the patients History and Physical, chart, labs and discussed the procedure including the risks, benefits and alternatives for the proposed anesthesia with the patient or authorized representative who has indicated his/her understanding and acceptance.     Dental advisory given  Plan Discussed with: CRNA and Surgeon  Anesthesia Plan Comments:        Anesthesia Quick Evaluation

## 2019-03-17 ENCOUNTER — Encounter (HOSPITAL_COMMUNITY)
Admission: RE | Disposition: A | Discharge status: Home / Self Care | Payer: Self-pay | Source: Home / Self Care | Attending: Neurosurgery

## 2019-03-17 ENCOUNTER — Inpatient Hospital Stay (HOSPITAL_COMMUNITY)
Admission: RE | Admit: 2019-03-17 | Discharge: 2019-03-19 | DRG: 026 | Disposition: A | Discharge status: Home / Self Care | Payer: BC Managed Care – PPO | Attending: Neurosurgery | Admitting: Neurosurgery

## 2019-03-17 ENCOUNTER — Inpatient Hospital Stay (HOSPITAL_COMMUNITY): Payer: BC Managed Care – PPO | Admitting: Anesthesiology

## 2019-03-17 ENCOUNTER — Encounter (HOSPITAL_COMMUNITY): Payer: Self-pay

## 2019-03-17 ENCOUNTER — Other Ambulatory Visit: Payer: Self-pay

## 2019-03-17 DIAGNOSIS — D1801 Hemangioma of skin and subcutaneous tissue: Secondary | ICD-10-CM | POA: Diagnosis not present

## 2019-03-17 DIAGNOSIS — Q8501 Neurofibromatosis, type 1: Secondary | ICD-10-CM | POA: Diagnosis not present

## 2019-03-17 DIAGNOSIS — C719 Malignant neoplasm of brain, unspecified: Secondary | ICD-10-CM | POA: Diagnosis present

## 2019-03-17 DIAGNOSIS — C710 Malignant neoplasm of cerebrum, except lobes and ventricles: Secondary | ICD-10-CM | POA: Diagnosis present

## 2019-03-17 DIAGNOSIS — N529 Male erectile dysfunction, unspecified: Secondary | ICD-10-CM | POA: Diagnosis not present

## 2019-03-17 DIAGNOSIS — G911 Obstructive hydrocephalus: Secondary | ICD-10-CM | POA: Diagnosis present

## 2019-03-17 DIAGNOSIS — Z79899 Other long term (current) drug therapy: Secondary | ICD-10-CM | POA: Diagnosis not present

## 2019-03-17 DIAGNOSIS — Z87891 Personal history of nicotine dependence: Secondary | ICD-10-CM | POA: Diagnosis not present

## 2019-03-17 DIAGNOSIS — Q8500 Neurofibromatosis, unspecified: Secondary | ICD-10-CM | POA: Diagnosis not present

## 2019-03-17 DIAGNOSIS — G919 Hydrocephalus, unspecified: Secondary | ICD-10-CM | POA: Diagnosis not present

## 2019-03-17 DIAGNOSIS — D496 Neoplasm of unspecified behavior of brain: Secondary | ICD-10-CM

## 2019-03-17 DIAGNOSIS — G9389 Other specified disorders of brain: Secondary | ICD-10-CM | POA: Diagnosis present

## 2019-03-17 DIAGNOSIS — G939 Disorder of brain, unspecified: Secondary | ICD-10-CM | POA: Diagnosis not present

## 2019-03-17 HISTORY — PX: VENTRICULOSTOMY: SHX5377

## 2019-03-17 LAB — POCT I-STAT 7, (LYTES, BLD GAS, ICA,H+H)
Bicarbonate: 25.5 mmol/L (ref 20.0–28.0)
Calcium, Ion: 1.19 mmol/L (ref 1.15–1.40)
HCT: 42 % (ref 39.0–52.0)
Hemoglobin: 14.3 g/dL (ref 13.0–17.0)
O2 Saturation: 100 %
Patient temperature: 35.2
Potassium: 4.3 mmol/L (ref 3.5–5.1)
Sodium: 139 mmol/L (ref 135–145)
TCO2: 27 mmol/L (ref 22–32)
pCO2 arterial: 41.3 mmHg (ref 32.0–48.0)
pH, Arterial: 7.39 (ref 7.350–7.450)
pO2, Arterial: 262 mmHg — ABNORMAL HIGH (ref 83.0–108.0)

## 2019-03-17 LAB — MRSA PCR SCREENING: MRSA by PCR: NEGATIVE

## 2019-03-17 LAB — ABO/RH: ABO/RH(D): B POS

## 2019-03-17 LAB — TYPE AND SCREEN
ABO/RH(D): B POS
Antibody Screen: NEGATIVE

## 2019-03-17 SURGERY — FRAMELESS BIOPSY WITH BRAINLAB
Anesthesia: General

## 2019-03-17 MED ORDER — BACITRACIN ZINC 500 UNIT/GM EX OINT
TOPICAL_OINTMENT | CUTANEOUS | Status: DC | PRN
  Administered 2019-03-17: 1 via TOPICAL

## 2019-03-17 MED ORDER — SODIUM CHLORIDE 0.9 % IV SOLN: INTRAVENOUS | Status: DC | PRN

## 2019-03-17 MED ORDER — 0.9 % SODIUM CHLORIDE (POUR BTL) OPTIME
TOPICAL | Status: DC | PRN
  Administered 2019-03-17 (×3): 1000 mL

## 2019-03-17 MED ORDER — THROMBIN 5000 UNITS EX SOLR
CUTANEOUS | Status: AC
  Filled 2019-03-17: qty 5000

## 2019-03-17 MED ORDER — ENALAPRILAT 1.25 MG/ML IV SOLN: 1.2500 mg | Freq: Four times a day (QID) | INTRAVENOUS | Status: DC | PRN

## 2019-03-17 MED ORDER — HYDROCODONE-ACETAMINOPHEN 5-325 MG PO TABS
1.0000 | ORAL_TABLET | ORAL | Status: DC | PRN
  Administered 2019-03-17: 17:00:00 1 via ORAL
  Filled 2019-03-17: qty 1

## 2019-03-17 MED ORDER — FENTANYL CITRATE (PF) 100 MCG/2ML IJ SOLN
INTRAMUSCULAR | Status: AC
  Filled 2019-03-17: qty 2

## 2019-03-17 MED ORDER — ARTIFICIAL TEARS OPHTHALMIC OINT
TOPICAL_OINTMENT | OPHTHALMIC | Status: AC
  Filled 2019-03-17: qty 14

## 2019-03-17 MED ORDER — FENTANYL CITRATE (PF) 250 MCG/5ML IJ SOLN
INTRAMUSCULAR | Status: AC
  Filled 2019-03-17: qty 5

## 2019-03-17 MED ORDER — SENNA 8.6 MG PO TABS
1.0000 | ORAL_TABLET | Freq: Two times a day (BID) | ORAL | Status: DC
  Administered 2019-03-17 – 2019-03-19 (×3): 8.6 mg via ORAL
  Filled 2019-03-17 (×4): qty 1

## 2019-03-17 MED ORDER — LABETALOL HCL 5 MG/ML IV SOLN
INTRAVENOUS | Status: AC
  Filled 2019-03-17: qty 4

## 2019-03-17 MED ORDER — NALOXONE HCL 0.4 MG/ML IJ SOLN: 0.0800 mg | INTRAMUSCULAR | Status: DC | PRN

## 2019-03-17 MED ORDER — FENTANYL CITRATE (PF) 100 MCG/2ML IJ SOLN
25.0000 ug | INTRAMUSCULAR | Status: DC | PRN
  Administered 2019-03-17: 50 ug via INTRAVENOUS

## 2019-03-17 MED ORDER — DOCUSATE SODIUM 100 MG PO CAPS
100.0000 mg | ORAL_CAPSULE | Freq: Two times a day (BID) | ORAL | Status: DC
  Administered 2019-03-17 – 2019-03-19 (×3): 100 mg via ORAL
  Filled 2019-03-17 (×4): qty 1

## 2019-03-17 MED ORDER — CEFAZOLIN SODIUM-DEXTROSE 1-4 GM/50ML-% IV SOLN
1.0000 g | Freq: Three times a day (TID) | INTRAVENOUS | Status: AC
  Administered 2019-03-17 – 2019-03-18 (×3): 1 g via INTRAVENOUS
  Filled 2019-03-17 (×3): qty 50

## 2019-03-17 MED ORDER — ESMOLOL HCL 100 MG/10ML IV SOLN
INTRAVENOUS | Status: DC | PRN
  Administered 2019-03-17 (×3): 20 mg via INTRAVENOUS

## 2019-03-17 MED ORDER — POLYETHYLENE GLYCOL 3350 17 G PO PACK: 17.0000 g | PACK | Freq: Every day | ORAL | Status: DC | PRN

## 2019-03-17 MED ORDER — HEMOSTATIC AGENTS (NO CHARGE) OPTIME
TOPICAL | Status: DC | PRN
  Administered 2019-03-17 (×2): 1 via TOPICAL

## 2019-03-17 MED ORDER — PROMETHAZINE HCL 12.5 MG PO TABS
12.5000 mg | ORAL_TABLET | ORAL | Status: DC | PRN
  Filled 2019-03-17: qty 2

## 2019-03-17 MED ORDER — ONDANSETRON HCL 4 MG PO TABS: 4.0000 mg | ORAL_TABLET | ORAL | Status: DC | PRN

## 2019-03-17 MED ORDER — THROMBIN 20000 UNITS EX SOLR: CUTANEOUS | Status: DC | PRN

## 2019-03-17 MED ORDER — THROMBIN 5000 UNITS EX SOLR: OROMUCOSAL | Status: DC | PRN

## 2019-03-17 MED ORDER — LABETALOL HCL 5 MG/ML IV SOLN
10.0000 mg | INTRAVENOUS | Status: DC | PRN
  Administered 2019-03-17 (×2): 10 mg via INTRAVENOUS

## 2019-03-17 MED ORDER — CEFAZOLIN SODIUM-DEXTROSE 2-3 GM-%(50ML) IV SOLR
INTRAVENOUS | Status: DC | PRN
  Administered 2019-03-17: 2 g via INTRAVENOUS

## 2019-03-17 MED ORDER — THROMBIN 20000 UNITS EX SOLR
CUTANEOUS | Status: AC
  Filled 2019-03-17: qty 20000

## 2019-03-17 MED ORDER — ACETAMINOPHEN 325 MG PO TABS
650.0000 mg | ORAL_TABLET | ORAL | Status: DC | PRN
  Administered 2019-03-18 – 2019-03-19 (×4): 650 mg via ORAL
  Filled 2019-03-17 (×4): qty 2

## 2019-03-17 MED ORDER — PROPOFOL 10 MG/ML IV BOLUS
INTRAVENOUS | Status: AC
  Filled 2019-03-17: qty 20

## 2019-03-17 MED ORDER — MIDAZOLAM HCL 2 MG/2ML IJ SOLN
INTRAMUSCULAR | Status: DC | PRN
  Administered 2019-03-17: 2 mg via INTRAVENOUS

## 2019-03-17 MED ORDER — MEPERIDINE HCL 25 MG/ML IJ SOLN: 6.2500 mg | INTRAMUSCULAR | Status: DC | PRN

## 2019-03-17 MED ORDER — DEXAMETHASONE SODIUM PHOSPHATE 10 MG/ML IJ SOLN
6.0000 mg | Freq: Four times a day (QID) | INTRAMUSCULAR | Status: DC
  Administered 2019-03-17 – 2019-03-18 (×3): 6 mg via INTRAVENOUS
  Filled 2019-03-17 (×3): qty 1

## 2019-03-17 MED ORDER — LIDOCAINE-EPINEPHRINE 1 %-1:100000 IJ SOLN
INTRAMUSCULAR | Status: AC
  Filled 2019-03-17: qty 1

## 2019-03-17 MED ORDER — DEXAMETHASONE SODIUM PHOSPHATE 4 MG/ML IJ SOLN
4.0000 mg | Freq: Four times a day (QID) | INTRAMUSCULAR | Status: DC
  Filled 2019-03-17: qty 1

## 2019-03-17 MED ORDER — PROMETHAZINE HCL 25 MG/ML IJ SOLN: 6.2500 mg | INTRAMUSCULAR | Status: DC | PRN

## 2019-03-17 MED ORDER — ONDANSETRON HCL 4 MG/2ML IJ SOLN
INTRAMUSCULAR | Status: DC | PRN
  Administered 2019-03-17: 4 mg via INTRAVENOUS

## 2019-03-17 MED ORDER — PROPOFOL 10 MG/ML IV BOLUS
INTRAVENOUS | Status: DC | PRN
  Administered 2019-03-17: 120 mg via INTRAVENOUS
  Administered 2019-03-17: 50 mg via INTRAVENOUS
  Administered 2019-03-17 (×2): 30 mg via INTRAVENOUS
  Administered 2019-03-17: 50 mg via INTRAVENOUS

## 2019-03-17 MED ORDER — ENOXAPARIN SODIUM 40 MG/0.4ML ~~LOC~~ SOLN
40.0000 mg | SUBCUTANEOUS | Status: DC
  Filled 2019-03-17: qty 0.4

## 2019-03-17 MED ORDER — SUGAMMADEX SODIUM 200 MG/2ML IV SOLN
INTRAVENOUS | Status: DC | PRN
  Administered 2019-03-17: 400 mg via INTRAVENOUS

## 2019-03-17 MED ORDER — LACTATED RINGERS IV SOLN: INTRAVENOUS | Status: DC | PRN

## 2019-03-17 MED ORDER — LABETALOL HCL 5 MG/ML IV SOLN
5.0000 mg | INTRAVENOUS | Status: AC | PRN
  Administered 2019-03-17 (×4): 5 mg via INTRAVENOUS

## 2019-03-17 MED ORDER — BUPIVACAINE HCL (PF) 0.5 % IJ SOLN
INTRAMUSCULAR | Status: AC
  Filled 2019-03-17: qty 30

## 2019-03-17 MED ORDER — LIDOCAINE 2% (20 MG/ML) 5 ML SYRINGE
INTRAMUSCULAR | Status: DC | PRN
  Administered 2019-03-17: 40 mg via INTRAVENOUS

## 2019-03-17 MED ORDER — LEVETIRACETAM IN NACL 1000 MG/100ML IV SOLN
1000.0000 mg | INTRAVENOUS | Status: AC
  Administered 2019-03-17: 1000 mg via INTRAVENOUS
  Filled 2019-03-17: qty 100

## 2019-03-17 MED ORDER — MORPHINE SULFATE (PF) 2 MG/ML IV SOLN: 1.0000 mg | INTRAVENOUS | Status: DC | PRN

## 2019-03-17 MED ORDER — MIDAZOLAM HCL 2 MG/2ML IJ SOLN
INTRAMUSCULAR | Status: AC
  Filled 2019-03-17: qty 2

## 2019-03-17 MED ORDER — ROCURONIUM BROMIDE 10 MG/ML (PF) SYRINGE
PREFILLED_SYRINGE | INTRAVENOUS | Status: DC | PRN
  Administered 2019-03-17: 40 mg via INTRAVENOUS
  Administered 2019-03-17: 30 mg via INTRAVENOUS
  Administered 2019-03-17: 40 mg via INTRAVENOUS
  Administered 2019-03-17: 60 mg via INTRAVENOUS

## 2019-03-17 MED ORDER — CHLORHEXIDINE GLUCONATE CLOTH 2 % EX PADS
6.0000 | MEDICATED_PAD | Freq: Every day | CUTANEOUS | Status: DC
  Administered 2019-03-17 – 2019-03-18 (×2): 6 via TOPICAL

## 2019-03-17 MED ORDER — ACETAMINOPHEN 650 MG RE SUPP: 650.0000 mg | RECTAL | Status: DC | PRN

## 2019-03-17 MED ORDER — LIDOCAINE-EPINEPHRINE 1 %-1:100000 IJ SOLN
INTRAMUSCULAR | Status: DC | PRN
  Administered 2019-03-17: 5 mL
  Administered 2019-03-17: 4 mL

## 2019-03-17 MED ORDER — DEXAMETHASONE SODIUM PHOSPHATE 4 MG/ML IJ SOLN: 4.0000 mg | Freq: Three times a day (TID) | INTRAMUSCULAR | Status: DC

## 2019-03-17 MED ORDER — PANTOPRAZOLE SODIUM 40 MG IV SOLR
40.0000 mg | Freq: Every day | INTRAVENOUS | Status: DC
  Administered 2019-03-17: 21:00:00 40 mg via INTRAVENOUS
  Filled 2019-03-17 (×2): qty 40

## 2019-03-17 MED ORDER — FLEET ENEMA 7-19 GM/118ML RE ENEM: 1.0000 | ENEMA | Freq: Once | RECTAL | Status: DC | PRN

## 2019-03-17 MED ORDER — ESMOLOL HCL 100 MG/10ML IV SOLN
INTRAVENOUS | Status: AC
  Filled 2019-03-17: qty 10

## 2019-03-17 MED ORDER — FENTANYL CITRATE (PF) 250 MCG/5ML IJ SOLN
INTRAMUSCULAR | Status: DC | PRN
  Administered 2019-03-17: 350 ug via INTRAVENOUS
  Administered 2019-03-17: 50 ug via INTRAVENOUS
  Administered 2019-03-17: 150 ug via INTRAVENOUS
  Administered 2019-03-17 (×2): 50 ug via INTRAVENOUS

## 2019-03-17 MED ORDER — POTASSIUM CHLORIDE IN NACL 20-0.9 MEQ/L-% IV SOLN
INTRAVENOUS | Status: DC
  Filled 2019-03-17 (×3): qty 1000

## 2019-03-17 MED ORDER — DEXAMETHASONE SODIUM PHOSPHATE 10 MG/ML IJ SOLN
INTRAMUSCULAR | Status: DC | PRN
  Administered 2019-03-17: 10 mg via INTRAVENOUS

## 2019-03-17 MED ORDER — LEVETIRACETAM IN NACL 500 MG/100ML IV SOLN
500.0000 mg | Freq: Two times a day (BID) | INTRAVENOUS | Status: DC
  Administered 2019-03-17 – 2019-03-18 (×2): 500 mg via INTRAVENOUS
  Filled 2019-03-17 (×3): qty 100

## 2019-03-17 MED ORDER — ONDANSETRON HCL 4 MG/2ML IJ SOLN
4.0000 mg | INTRAMUSCULAR | Status: DC | PRN
  Administered 2019-03-18: 4 mg via INTRAVENOUS
  Filled 2019-03-17: qty 2

## 2019-03-17 MED ORDER — LABETALOL HCL 5 MG/ML IV SOLN
10.0000 mg | INTRAVENOUS | Status: DC | PRN
  Filled 2019-03-17: qty 4

## 2019-03-17 MED ORDER — MIDAZOLAM HCL 2 MG/2ML IJ SOLN: 0.5000 mg | Freq: Once | INTRAMUSCULAR | Status: DC | PRN

## 2019-03-17 SURGICAL SUPPLY — 50 items
ALCOHOL ISOPROPYL (RUBBING) (MISCELLANEOUS) ×3 IMPLANT
BLADE CLIPPER SURG (BLADE) ×6 IMPLANT
CANISTER SUCT 3000ML PPV (MISCELLANEOUS) ×6 IMPLANT
CATH EMB 2FR 60CM (CATHETERS) ×3 IMPLANT
CNTNR URN SCR LID CUP LEK RST (MISCELLANEOUS) ×1 IMPLANT
CONT SPEC 4OZ STRL OR WHT (MISCELLANEOUS) ×2
DERMABOND ADVANCED (GAUZE/BANDAGES/DRESSINGS) ×2
DERMABOND ADVANCED .7 DNX12 (GAUZE/BANDAGES/DRESSINGS) ×1 IMPLANT
DRAPE HALF SHEET 40X57 (DRAPES) ×3 IMPLANT
DRSG TELFA 3X8 NADH (GAUZE/BANDAGES/DRESSINGS) ×3 IMPLANT
DURAPREP 26ML APPLICATOR (WOUND CARE) ×6 IMPLANT
ELECT REM PT RETURN 9FT ADLT (ELECTROSURGICAL) ×3
ELECTRODE REM PT RTRN 9FT ADLT (ELECTROSURGICAL) ×1 IMPLANT
GAUZE SPONGE 4X4 12PLY STRL (GAUZE/BANDAGES/DRESSINGS) ×3 IMPLANT
GLOVE BIO SURGEON STRL SZ7.5 (GLOVE) ×6 IMPLANT
GLOVE BIOGEL PI IND STRL 7.0 (GLOVE) ×3 IMPLANT
GLOVE BIOGEL PI IND STRL 7.5 (GLOVE) ×5 IMPLANT
GLOVE BIOGEL PI INDICATOR 7.0 (GLOVE) ×6
GLOVE BIOGEL PI INDICATOR 7.5 (GLOVE) ×10
GLOVE ECLIPSE 7.5 STRL STRAW (GLOVE) ×9 IMPLANT
GOWN STRL REUS W/ TWL LRG LVL3 (GOWN DISPOSABLE) ×2 IMPLANT
GOWN STRL REUS W/ TWL XL LVL3 (GOWN DISPOSABLE) ×1 IMPLANT
GOWN STRL REUS W/TWL LRG LVL3 (GOWN DISPOSABLE) ×4
GOWN STRL REUS W/TWL XL LVL3 (GOWN DISPOSABLE) ×2
INTRODUCER ENDO MINOP 6.1X6 (INTRODUCER) ×3 IMPLANT
KIT BASIN OR (CUSTOM PROCEDURE TRAY) ×3 IMPLANT
KIT TURNOVER KIT B (KITS) ×3 IMPLANT
MARKER SKIN DUAL TIP RULER LAB (MISCELLANEOUS) ×6 IMPLANT
NEEDLE BIOPSY DISPOS 1.8X150MM (NEEDLE) ×3 IMPLANT
NEEDLE HYPO 25X1 1.5 SAFETY (NEEDLE) ×3 IMPLANT
NEEDLE SPNL 20GX3.5 QUINCKE YW (NEEDLE) ×3 IMPLANT
NS IRRIG 1000ML POUR BTL (IV SOLUTION) ×9 IMPLANT
PACK LAMINECTOMY NEURO (CUSTOM PROCEDURE TRAY) ×3 IMPLANT
PERFORATOR LRG  14-11MM (BIT) ×2
PERFORATOR LRG 14-11MM (BIT) ×1 IMPLANT
PIN MAYFIELD SKULL DISP (PIN) ×3 IMPLANT
PLATE 1.5/0.5 18.5MM BURR HOLE (Plate) ×3 IMPLANT
SCREW SELF DRILL HT 1.5/4MM (Screw) ×9 IMPLANT
SPONGE SURGIFOAM ABS GEL SZ50 (HEMOSTASIS) ×3 IMPLANT
STAPLER SKIN PROX WIDE 3.9 (STAPLE) ×6 IMPLANT
SUT NURALON 4 0 TF (SUTURE) ×6 IMPLANT
SUT VIC AB 2-0 CP2 18 (SUTURE) ×3 IMPLANT
SUT VICRYL RAPIDE 3 0 (SUTURE) ×6 IMPLANT
SYR TB 1ML 25GX5/8 (SYRINGE) ×3 IMPLANT
TOWEL GREEN STERILE (TOWEL DISPOSABLE) ×3 IMPLANT
TOWEL GREEN STERILE FF (TOWEL DISPOSABLE) ×3 IMPLANT
TRAY FOLEY MTR SLVR 16FR STAT (SET/KITS/TRAYS/PACK) ×3 IMPLANT
TUBE CONNECTING 12'X1/4 (SUCTIONS) ×1
TUBE CONNECTING 12X1/4 (SUCTIONS) ×2 IMPLANT
WATER STERILE IRR 1000ML POUR (IV SOLUTION) ×3 IMPLANT

## 2019-03-17 NOTE — Transfer of Care (Signed)
Immediate Anesthesia Transfer of Care Note  Patient: Fumio Vandam  Procedure(s) Performed: FRAMELESS BIOPSY WITH BRAINLAB OF RIGHT THALAMIC LESION (N/A ) ENDOSCOPIC THIRD VENTRICULOSTOMY (N/A )  Patient Location: PACU  Anesthesia Type:General  Level of Consciousness: drowsy  Airway & Oxygen Therapy: Patient Spontanous Breathing and Patient connected to nasal cannula oxygen  Post-op Assessment: Report given to RN and Post -op Vital signs reviewed and stable  Post vital signs: Reviewed and stable  Last Vitals:  Vitals Value Taken Time  BP 151/86 03/17/19 1205  Temp 36.4 C 03/17/19 1205  Pulse 64 03/17/19 1209  Resp 11 03/17/19 1209  SpO2 100 % 03/17/19 1209  Vitals shown include unvalidated device data.  Last Pain:  Vitals:   03/17/19 0712  PainSc: 0-No pain      Patients Stated Pain Goal: 3 (21/22/48 2500)  Complications: No apparent anesthesia complications

## 2019-03-17 NOTE — Anesthesia Procedure Notes (Signed)
Procedure Name: Intubation Date/Time: 03/17/2019 8:41 AM Performed by: Janace Litten, CRNA Pre-anesthesia Checklist: Patient identified, Emergency Drugs available, Suction available and Patient being monitored Patient Re-evaluated:Patient Re-evaluated prior to induction Oxygen Delivery Method: Circle System Utilized Preoxygenation: Pre-oxygenation with 100% oxygen Induction Type: IV induction Ventilation: Mask ventilation without difficulty and Oral airway inserted - appropriate to patient size Laryngoscope Size: Mac and 4 Grade View: Grade I Tube type: Oral Tube size: 7.5 mm Number of attempts: 1 Airway Equipment and Method: Stylet and Oral airway Placement Confirmation: ETT inserted through vocal cords under direct vision,  positive ETCO2 and breath sounds checked- equal and bilateral Secured at: 23 cm Tube secured with: Tape Dental Injury: Teeth and Oropharynx as per pre-operative assessment

## 2019-03-17 NOTE — H&P (Signed)
Aaron Espinoza is an 44 y.o. male.   Chief Complaint: confusion HPI: 44 yo M with likely neurofibromatosis who presented to the clinic for evaluation of progressive confusion and imbalance. 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  Laboratory workup was negative.  He underwent stereotactic biopsy of the lesion as well as ETV and is being admitted postop.  Past Medical History:  Diagnosis Date  . BPH (benign prostatic hyperplasia) 07/20/2015  . Brain mass   . Cognitive dysfunction 01/2019  . Fatigue 01/2019  . Headache   . 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 HAND SURGERY Left    pinky - tumor benign  . LEG SURGERY Left    inner thigh tumor - benign    Family History  Problem Relation Age of Onset  . Cancer Paternal Grandfather    Social History:  reports that he has quit smoking. His smoking use included cigars and cigarettes. He has never used smokeless tobacco. He reports current alcohol use of about 6.0 - 9.0 standard drinks of alcohol per week. He reports that he does not use drugs.  Allergies: No Known Allergies  Medications Prior to Admission  Medication Sig Dispense Refill  . cholecalciferol (VITAMIN D3) 25 MCG (1000 UNIT) tablet Take 1,000 Units by mouth daily.    Marland Kitchen loratadine (CLARITIN) 10 MG tablet Take 10 mg by mouth daily.    . Maca Root POWD Take 2 capsules by mouth daily.     . Misc Natural Products (JOINT SUPPORT PO) Take 1 tablet by mouth daily.     . Nutritional Supplements (JUICE PLUS FIBRE PO) Take 1 Dose by mouth daily. Juice Plus Omega Blend + Berry Blend + Fruit & Veggie Blend (Powder Form Daily)    . triamcinolone cream (KENALOG) 0.1 % Apply 1 application topically at bedtime as needed. (Patient taking differently: Apply 1 application topically daily as needed (skin irritation). ) 15 g 1    Results  for orders placed or performed during the hospital encounter of 03/17/19 (from the past 48 hour(s))  Type and screen [place]     Status: None   Collection Time: 03/17/19  8:00 AM  Result Value Ref Range   ABO/RH(D) B POS    Antibody Screen NEG    Sample Expiration      03/20/2019,2359 Performed at [facility], [place] [address] [place], [place] [address]   ABO/Rh     Status: None   Collection Time: 03/17/19  8:00 AM  Result Value Ref Range   ABO/RH(D)      B POS Performed at [place] [address], [place], [place] [address]   I-STAT 7, (LYTES, BLD GAS, ICA, H+H)     Status: Abnormal   Collection Time: 03/17/19 11:33 AM  Result Value Ref Range   pH, Arterial 7.390 7.350 - 7.450   pCO2 arterial 41.3 32.0 - 48.0 mmHg   pO2, Arterial 262.0 (H) 83.0 - 108.0 mmHg   Bicarbonate 25.5 20.0 - 28.0 mmol/L   TCO2 27 22 - 32 mmol/L   O2 Saturation 100.0 %   Sodium 139 135 - 145 mmol/L   Potassium 4.3 3.5 - 5.1 mmol/L   Calcium, Ion 1.19 1.15 - 1.40 mmol/L   HCT 42.0 39.0 - 52.0 %   Hemoglobin 14.3 13.0 - 17.0 g/dL  Patient temperature 35.2 C    Sample type ARTERIAL    MR BRAIN W WO CONTRAST  Result Date: 03/17/2019 CLINICAL DATA:  Brain mass. Stereotactic protocol. EXAM: MRI HEAD WITHOUT AND WITH CONTRAST TECHNIQUE: Multiplanar, multiecho pulse sequences of the brain and surrounding structures were obtained without and with intravenous contrast. CONTRAST:  7.68m GADAVIST GADOBUTROL 1 MMOL/ML IV SOLN COMPARISON:  MRI of the brain March 08, 2019 FINDINGS: Brain: Again seen is a mass lesion centered on the right thalamus with ill defined margins extending inferiorly into the midbrain involving the cerebral aqueduct and posterior aspect of the third ventricle bilaterally, with mass effect resulting in obstructive hydrocephalus. There is also increased T2 signal in the fornices and hippocampi. The lesion shows patchy peripheral contrast enhancement which  extends to the walls of the third ventricle and meeting the superior aspect of the cerebral aqueduct, suggesting subependymal spread. No restricted diffusion noted. The lesion measures approximately 4.6 by 3.7 by 3.2 cm (AP, T, cc), unchanged from prior. Periventricular T2 hyperintensity likely representing transependymal edema is unchanged. Vascular: Normal flow voids. Skull and upper cervical spine: Normal marrow signal. Sinuses/Orbits: Negative. Other: None. IMPRESSION: 1. Unchanged appearance of mass centered on the right thalamus with extension into the midbrain involving the cerebral aqueduct and posterior aspect of the third ventricle with evidence of subependymal spread. 2. Unchanged obstructive hydrocephalus with periventricular T2 hyperintensity likely representing transependymal edema. Electronically Signed   By: KPedro EarlsM.D.   On: 03/17/2019 09:39    Review of Systems  Blood pressure (!) 168/111, pulse 66, temperature 97.6 F (36.4 C), resp. rate 16, height 6' (1.829 m), weight 78.9 kg, SpO2 100 %. Physical Exam   Awake, alert, O x 3 but decreased cognitive ability.  Mild left superior hemifield deficit to finger counting.  Bilateral clonus, hyperreflexia.  Bilateral dysmetria..   Assessment/Plan Right thalamic/midbrain infiltrating lesion with obstructive hydrocephalus s/p stereotactic biopsy and ETV - admit to ICU - CT head in am - f/u path - Decadron taper - Keppra   JVallarie Mare MD 03/17/2019, 12:26 PM

## 2019-03-17 NOTE — Anesthesia Postprocedure Evaluation (Signed)
Anesthesia Post Note  Patient: Tyaire Odem  Procedure(s) Performed: FRAMELESS BIOPSY WITH BRAINLAB OF RIGHT THALAMIC LESION (N/A ) ENDOSCOPIC THIRD VENTRICULOSTOMY (N/A )     Patient location during evaluation: PACU Anesthesia Type: General Level of consciousness: sedated, patient cooperative and oriented (pt sleepy but appropriate on arousal) Pain management: pain level controlled Vital Signs Assessment: post-procedure vital signs reviewed and stable Respiratory status: spontaneous breathing, nonlabored ventilation, respiratory function stable and patient connected to nasal cannula oxygen Cardiovascular status: blood pressure returned to baseline and stable Postop Assessment: no apparent nausea or vomiting Anesthetic complications: no    Last Vitals:  Vitals:   03/17/19 1700 03/17/19 1800  BP: (!) 152/83 (!) 143/72  Pulse: 70 67  Resp: 15 13  Temp:    SpO2: 100% 96%    Last Pain:  Vitals:   03/17/19 1705  TempSrc:   PainSc: 4                  Narayan Scull,E. Crissie Aloi

## 2019-03-17 NOTE — Anesthesia Procedure Notes (Signed)
Arterial Line Insertion Start/End2/11/2019 8:41 AM Performed by: Verdie Drown, CRNA, CRNA  Patient location: Pre-op. Preanesthetic checklist: patient identified, IV checked, risks and benefits discussed, surgical consent, monitors and equipment checked and pre-op evaluation Lidocaine 1% used for infiltration Left, radial was placed Catheter size: 20 G Hand hygiene performed  and maximum sterile barriers used  Allen's test indicative of satisfactory collateral circulation Attempts: 2 (attempt by Henderson Cloud, CRNA x1; Charolette Forward x1) Procedure performed without using ultrasound guided technique. Following insertion, Biopatch and dressing applied. Post procedure assessment: normal  Patient tolerated the procedure well with no immediate complications.

## 2019-03-17 NOTE — Op Note (Signed)
Procedure(s):  1. Frameless stereotactic brain biopsy using BrainLab Vario guide system and navigation 2. Endoscopic third ventriculostomy   Aaron Espinoza male 44 y.o. 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  He was found to have a infiltrating partially enhancing lesion in his right thalamus and midbrain which was causing obstructive hydrocephalus.  Basic lab work-up was negative.  I had a long discussion with the patient and his wife.  Given the concerns could represent a malignant primary brain tumor, I recommended stereotactic biopsy of this lesion.  Additionally, with his obstructive hydrocephalus, I recommended endoscopic third ventriculostomy to treat this while potentially avoiding placement of a shunt.  Risks, benefits, alternatives, and expected convalescence were discussed with the patient and his wife.  Risks discussed included but were not limited to bleeding, pain, infection, seizure, stroke, scar, nondiagnostic biopsy with need for further biopsy, failure of third ventriculostomy with need for CSF shunting, neurologic deficit, coma, and death.  Informed consent was obtained.  Surgeon: Vallarie Mare   Assistants: Deatra Ina, MD. Dr. Venetia Constable provided critical assistance during the procedure including aiding with the ventriculostomy of the floor of the third ventricle while I was holding and controlling the endoscope.  No qualified trainees were available to assist with the procedure.  Anesthesia: General endotracheal anesthesia   Procedure in detail:  The patient was brought to the operating room.  A timeout was performed.  General anesthesia was induced and patient was intubated by the anesthesia service.  After appropriate lines and monitors were placed,  patient's head was turned to the right and placed in a [place] head holder and affixed to the bed.  A preoperative thin cut MRI was reconstructed in a 3D image which allowed for surface match registration with the [employer] navigation system.  The preoperative MRI was fused with the DTI imaging.  Tractography was performed which allowed for planning of a biopsy trajectory avoiding the corticospinal tract.  We planned in trajectory entering through the posterior superior temporal lobe behind the corticospinal tract this patient already had mild visual field deficit.  The entry point was marked on the skin.  The scalp was shaved, preprepped with alcohol and cleansing solution and prepped and draped in sterile fashion.  1% lidocaine with epinephrine injected into the planned stab incision.  The BrainLab variable guide was locked into appropriate trajectory and stab incision was made.  Drill guide was placed and docked on the bone and high-speed drill was used to perform a twist drill hole.  The dura was punctured.  The navigated BrainLab biopsy needle was then introduced and at the predetermined length, was passed into the superficial aspect of the enhancing portion of the lesion.  Specimens were obtained at various rotations of the needle.  The needle was then passed approximately 1.2 cm deeper and additional specimens were obtained and labeled as deeper specimens.  Frozen section was sent and returned as lesional tissue with glial tissue and nuclear atypia.  The needle was withdrawn.  No untoward bleeding was encountered.  The wound was irrigated thoroughly with bacitracin irrigation.  The stab incision was closed with two 3-0 Vicryl repeat stitches.  Dermabond was placed over the stab incision.  The patient was then repositioned with the head straight up and and area over Kocher's point  was on the right side was shaved, preprepped with alcohol prepped and draped in sterile fashion.  The a semilunar incision was  planned around Kocher's point.  1% lidocaine with epinephrine injected in the skin.  Incision made with a 10 blade.  The periosteum was swept off the skull.  Perforator was used to perform a bur hole.  The dura was then coagulated and cut in cruciate manner.  The pia was then coagulated and cut.  Using the BrainLab system, appropriate trajectory into the right frontal horn going towards the foramen of Monro was planned.  A 19 French peel-away sheath was then passed through the corticotomy approximately 5 cm.  The endoscope with working channel was passed into the peel-away sheath and confirmed intraventricular placement.  Peel-away sheath was then secured to the scalp.  The endoscope was then passed through the peel-away sheath into the ventricular system.  The enlarged foramen of Monro was traversed and in the third ventricle, the mamillary bodies, tuber cinereum, infundibular recess, and optic chiasm could clearly be seen.  The posterior third ventricle was collapsed due to the tumor.  Entry point into the floor of the third ventricle is planned and in front of the mamillary bodies in the thin portion of the flor.  There was some thicker portion slightly anterior and well-behind the infundibular recess that was also opened with with small alligator forceps.  Membrane umbilicus was then seen and was opened with the alligator forceps and a spreading motion.  Small Fogarty balloon was then placed to dilate up the opening in the Lilliquist membrane and the floor the third ventricle.  The endoscope was then used to look down the tract that was made and there is good communication of CSF into the prepontine cistern in front of the basilar artery.  No bleeding was encountered.  The endoscope was then brought back to the lateral ventricle and the fornix was inspected with no evidence of injury.  The peel-away sheath was then withdrawn under direct visualization.  A piece of Gelfoam was used to close the dural defect.  A  Zimmer cranial plate was used placed over the bur hole.  The wound was irrigated with bacitracin Impregnated irrigation.  The incision was closed with 2-0 Vicryl stitches in perioperative fashion followed by 3-0 Vicryl repeat in running fashion and Dermabond.  Patient was then removed from Mayfield headholder and extubated by the anesthesia service moving all extremities.  All counts were correct at the end the surgery.  No complications were noted.   Findings: Frozen section consistent with glial tissue with nuclear atypia   Estimated Blood Loss: Less than 10 mL         Drains: none        Specimens: Superficial thalamic lesion, deep thalamic lesion         Implants: Zimmer plating        Complications:  none         Disposition: PACU - hemodynamically stable.         Condition: stable

## 2019-03-18 ENCOUNTER — Encounter: Payer: Self-pay | Admitting: *Deleted

## 2019-03-18 ENCOUNTER — Inpatient Hospital Stay (HOSPITAL_COMMUNITY): Payer: BC Managed Care – PPO

## 2019-03-18 DIAGNOSIS — G9389 Other specified disorders of brain: Secondary | ICD-10-CM | POA: Diagnosis not present

## 2019-03-18 DIAGNOSIS — Q8500 Neurofibromatosis, unspecified: Secondary | ICD-10-CM | POA: Diagnosis not present

## 2019-03-18 DIAGNOSIS — M5126 Other intervertebral disc displacement, lumbar region: Secondary | ICD-10-CM | POA: Diagnosis not present

## 2019-03-18 LAB — CBC WITH DIFFERENTIAL/PLATELET
Abs Immature Granulocytes: 0.06 10*3/uL (ref 0.00–0.07)
Basophils Absolute: 0 10*3/uL (ref 0.0–0.1)
Basophils Relative: 0 %
Eosinophils Absolute: 0 10*3/uL (ref 0.0–0.5)
Eosinophils Relative: 0 %
HCT: 42.2 % (ref 39.0–52.0)
Hemoglobin: 14.6 g/dL (ref 13.0–17.0)
Immature Granulocytes: 0 %
Lymphocytes Relative: 5 %
Lymphs Abs: 0.7 10*3/uL (ref 0.7–4.0)
MCH: 29.9 pg (ref 26.0–34.0)
MCHC: 34.6 g/dL (ref 30.0–36.0)
MCV: 86.5 fL (ref 80.0–100.0)
Monocytes Absolute: 0.7 10*3/uL (ref 0.1–1.0)
Monocytes Relative: 5 %
Neutro Abs: 13.4 10*3/uL — ABNORMAL HIGH (ref 1.7–7.7)
Neutrophils Relative %: 90 %
Platelets: 227 10*3/uL (ref 150–400)
RBC: 4.88 MIL/uL (ref 4.22–5.81)
RDW: 11.8 % (ref 11.5–15.5)
WBC: 14.9 10*3/uL — ABNORMAL HIGH (ref 4.0–10.5)
nRBC: 0 % (ref 0.0–0.2)

## 2019-03-18 LAB — BASIC METABOLIC PANEL WITH GFR
Anion gap: 13 (ref 5–15)
BUN: 9 mg/dL (ref 6–20)
CO2: 24 mmol/L (ref 22–32)
Calcium: 9.3 mg/dL (ref 8.9–10.3)
Chloride: 102 mmol/L (ref 98–111)
Creatinine, Ser: 0.77 mg/dL (ref 0.61–1.24)
GFR calc Af Amer: >60 mL/min
GFR calc non Af Amer: >60 mL/min
Glucose, Bld: 137 mg/dL — ABNORMAL HIGH (ref 70–99)
Potassium: 4.2 mmol/L (ref 3.5–5.1)
Sodium: 139 mmol/L (ref 135–145)

## 2019-03-18 IMAGING — MR MR LUMBAR SPINE WO/W CM
4 of 7 series · 25 of 48 positions shown · IV contrast (gadavist)
Comparison: Thoracic spine MRI reported separately today.

CLINICAL DATA: 44-year-old male with neurofibromatosis. Right
thalamic mass with ependymal intracranial spread.

EXAM:
MRI LUMBAR SPINE WITHOUT AND WITH CONTRAST
TECHNIQUE: Multiplanar and multiecho pulse sequences of the lumbar spine were
obtained without and with intravenous contrast.
CONTRAST:  8mL GADAVIST GADOBUTROL 1 MMOL/ML IV SOLN in conjunction
with contrast enhanced imaging of the cervical and thoracic spines
reported separately.

[Series 1: T2 · sagittal · 4.0mm · 0.76mm/px · 6 of 15 slices shown (1 of 2)]
[im 1/15]
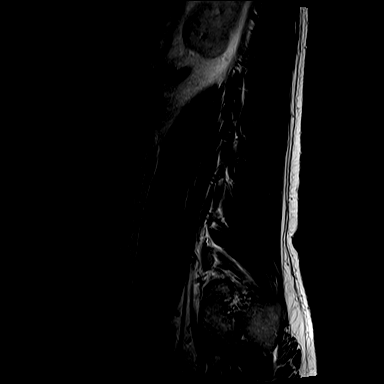
[im 3/15]
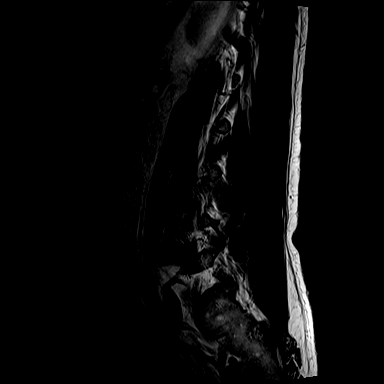
[im 6/15]
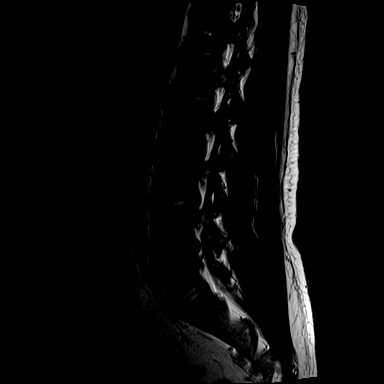
[im 9/15]
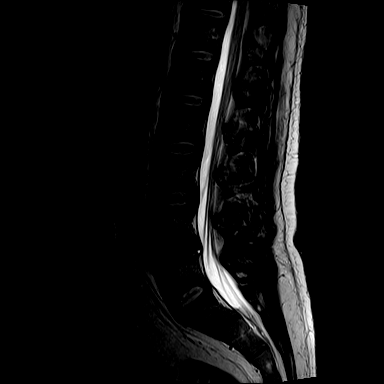
[im 12/15]
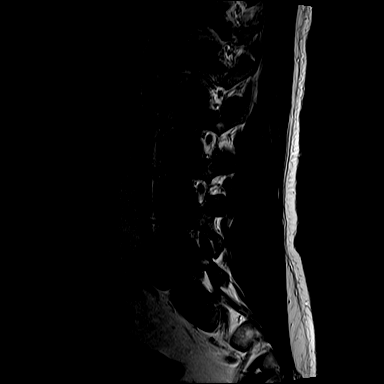
[im 15/15]
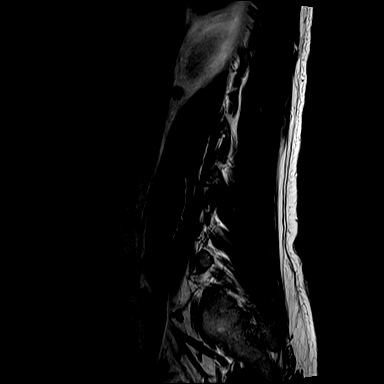

[Series 3: T1 · sagittal · 4.0mm · 0.88mm/px · 5 of 15 slices shown (1 of 2)]
[im 1/15]
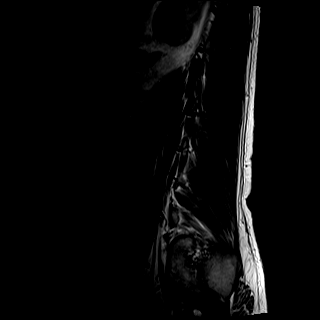
[im 4/15]
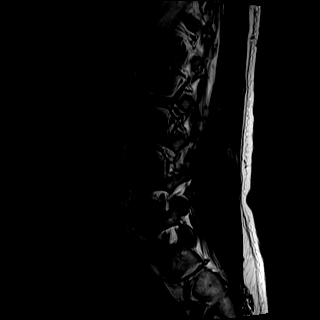
[im 8/15]
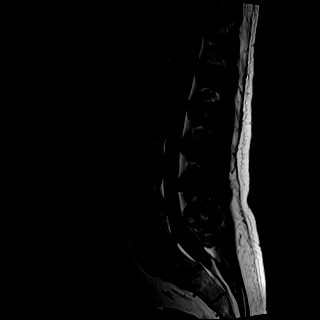
[im 11/15]
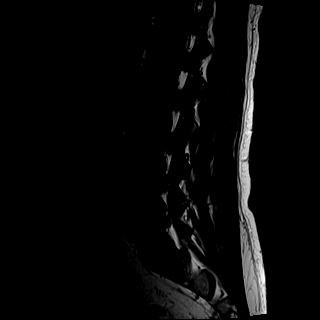
[im 15/15]
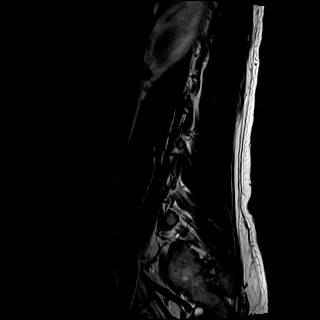

[Series 4: T2 · axial · 5.0mm · 0.57mm/px · z∈[-574,-380]mm · 9 of 24 slices shown (2 of 2)]
[im 1/24]
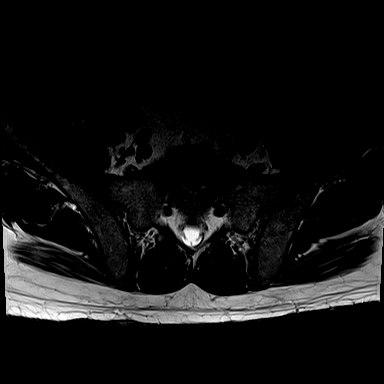
[im 3/24]
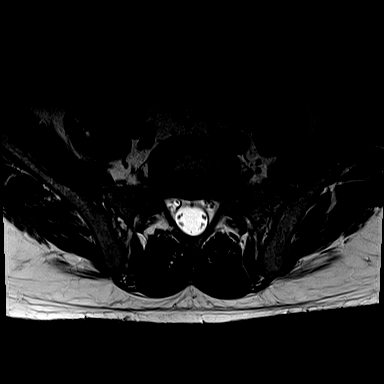
[im 6/24]
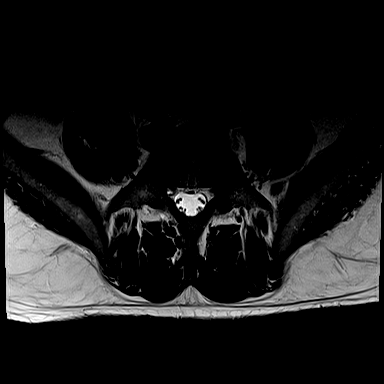
[im 9/24]
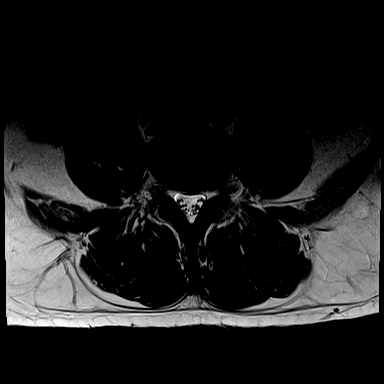
[im 12/24]
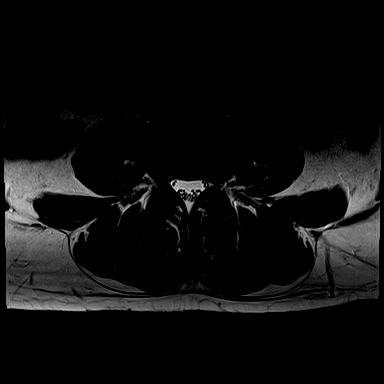
[im 15/24]
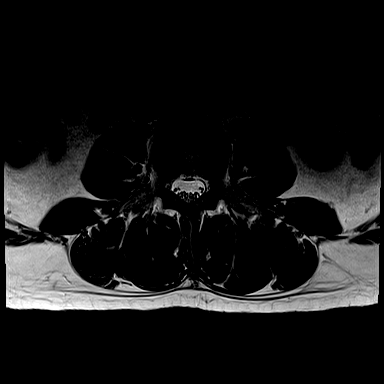
[im 18/24]
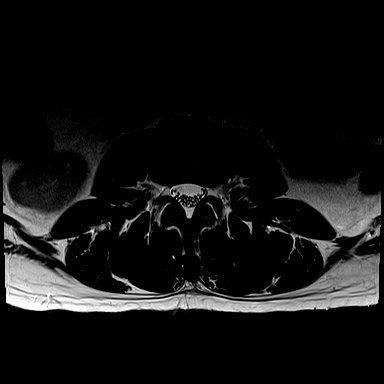
[im 21/24]
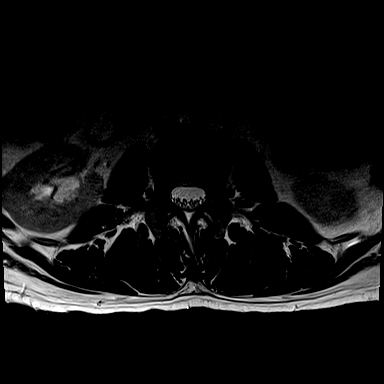
[im 24/24]
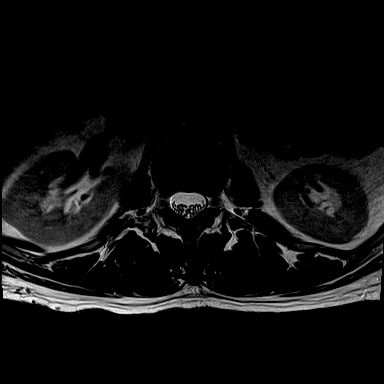

[Series 5: T1 · axial · 5.0mm · 0.34mm/px · z∈[-574,-402]mm · 5 of 24 slices shown (2 of 2)]
[im 1/24]
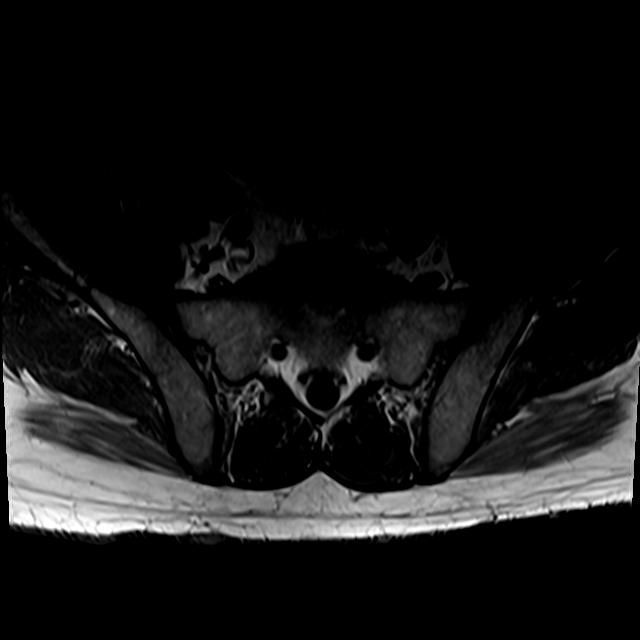
[im 3/24]
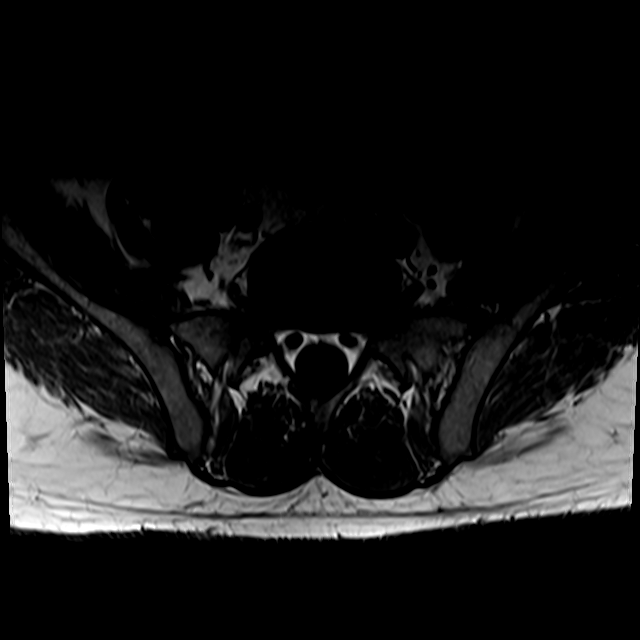
[im 6/24]
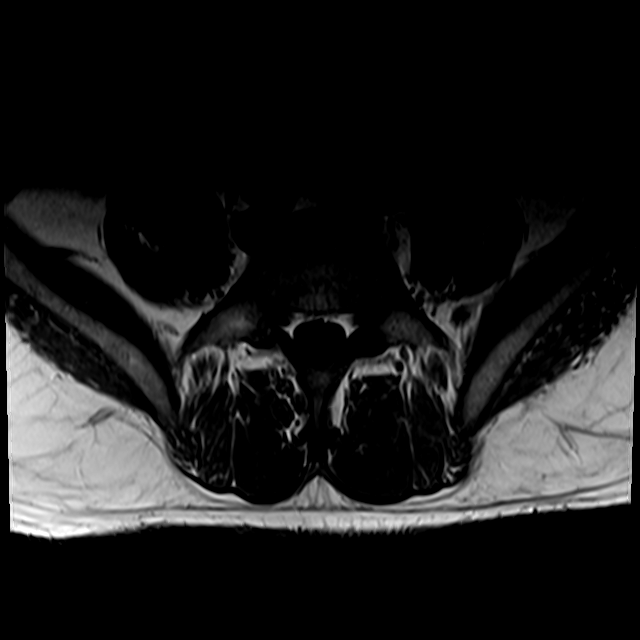
[im 12/24]
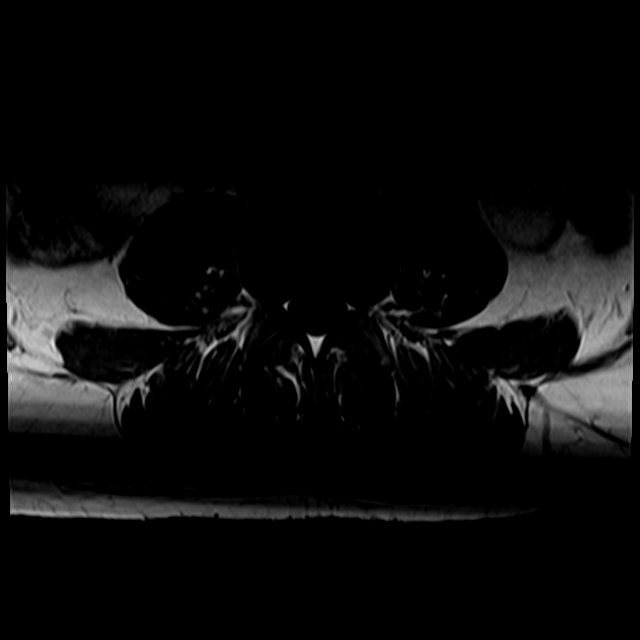
[im 21/24]
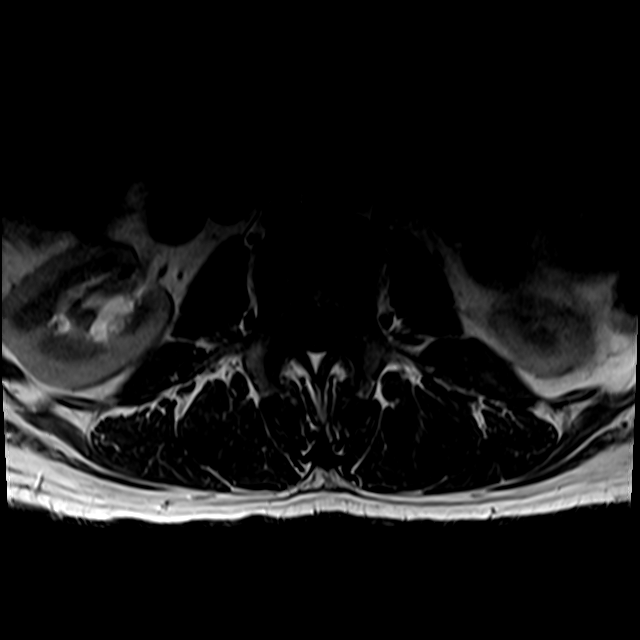

[25 of 48 positions shown; findings below may reference images not displayed]

FINDINGS: Segmentation: Normal, concordant with the thoracic spine numbering
today.

Alignment: Mild straightening of lumbar lordosis. No
spondylolisthesis.

Vertebrae: No marrow edema or evidence of acute osseous abnormality.
Visualized bone marrow signal is within normal limits. Intact
visible sacrum and SI joints.

Conus medullaris and cauda equina: Conus extends to the T12-L1
level. No lower spinal cord or conus signal abnormality. No abnormal
intradural enhancement. Cauda equina nerve roots appear symmetric
and normal. No dural thickening.

Paraspinal and other soft tissues: Negative visible abdominal
viscera. No lumbar paraspinal tumor identified.

Disc levels:

Mild for age lumbar spine degeneration.

L4-L5 disc desiccation with mild circumferential disc bulge.
Broad-based mild posterior component of disc with annular fissure
(series 1, image 8). Mild posterior element hypertrophy. No spinal
or convincing lateral recess stenosis. Up to mild bilateral L4
foraminal stenosis.
IMPRESSION: 1. Normal conus medullaris and cauda equina, no leptomeningeal
disease in the lumbosacral spine.
2. No lumbar paraspinal tumor identified.
3. Mild lumbar spine degeneration limited to L4-L5 with up to mild
bilateral L4 foraminal stenosis.

## 2019-03-18 IMAGING — MR MR THORACIC SPINE WO/W CM
8 of 11 series · 23 of 48 positions shown · IV contrast (gadavist)
Comparison: Cervical spine MRI today.  Brain MRI [DATE]

CLINICAL DATA: 44-year-old male with neurofibromatosis. Right
thalamic mass with ependymal intracranial spread.

EXAM:
MRI THORACIC WITHOUT AND WITH CONTRAST
TECHNIQUE: Multiplanar and multiecho pulse sequences of the thoracic spine were
obtained without and with intravenous contrast.
CONTRAST:  8mL GADAVIST GADOBUTROL 1 MMOL/ML IV SOLN in conjunction
with contrast enhanced imaging of the cervical spine reported
separately.

[Series 18: T2 · axial · 3.0mm · 0.66mm/px · z∈[-149,-36]mm · 5 of 39 slices shown (1 of 3)]
[im 1/39]
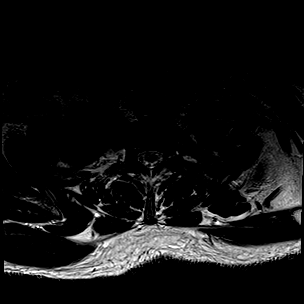
[im 10/39]
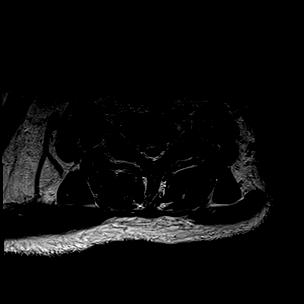
[im 20/39]
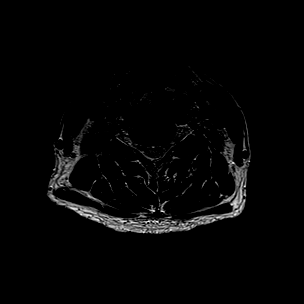
[im 29/39]
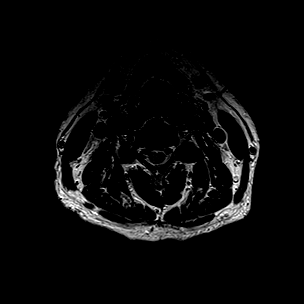
[im 39/39]
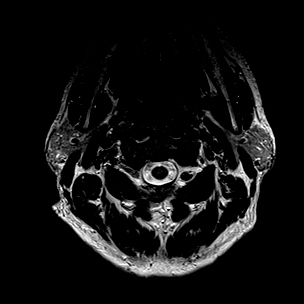

[Series 20: T2 · sagittal · 3.0mm · 0.76mm/px · 2 of 17 slices shown (2 of 3)]
[im 1/17]
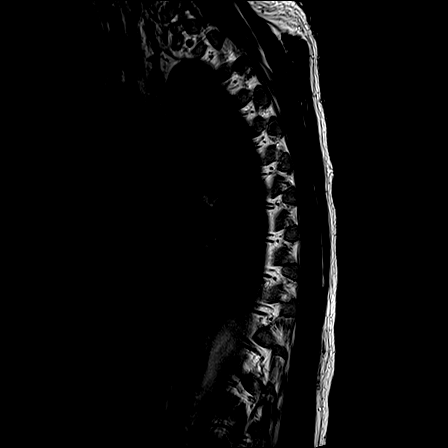
[im 17/17]
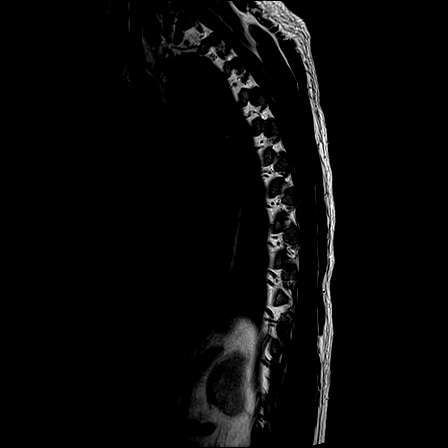

[Series 23: T1 · sagittal · 6.0mm · 1.23mm/px · 1 of 9 slices shown (1 of 3)]
[im 1/9]
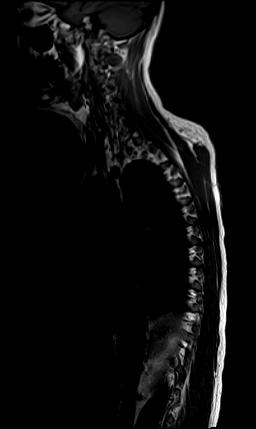

[Series 24: T1 · sagittal · 3.0mm · 0.76mm/px · 2 of 17 slices shown (2 of 3)]
[im 1/17]
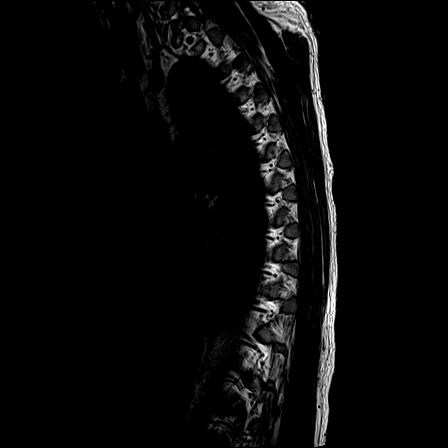
[im 17/17]
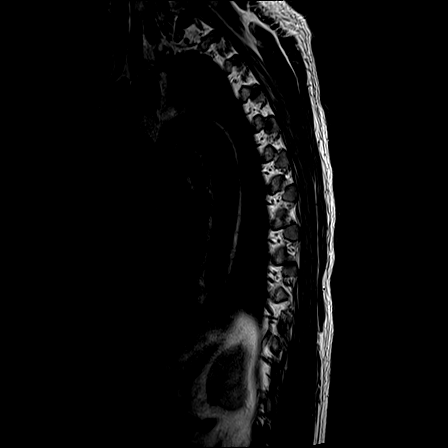

[Series 25: STIR · sagittal · 3.0mm · 0.38mm/px · 1 of 17 slices shown]
[im 1/17]
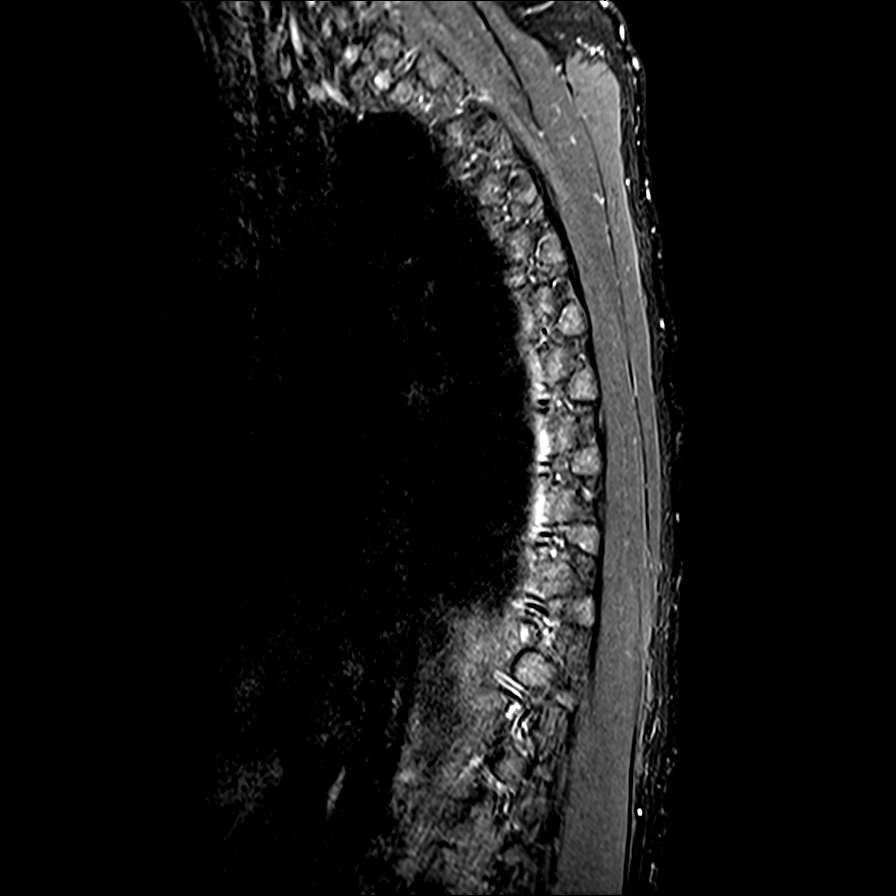

[Series 26: T2 · axial · 5.0mm · 0.59mm/px · z∈[-356,-127]mm · 5 of 39 slices shown (3 of 3)]
[im 1/39]
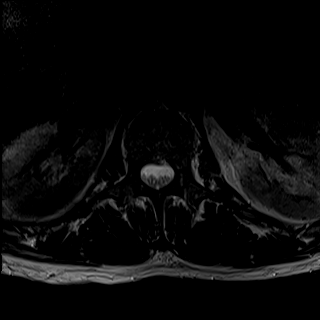
[im 10/39]
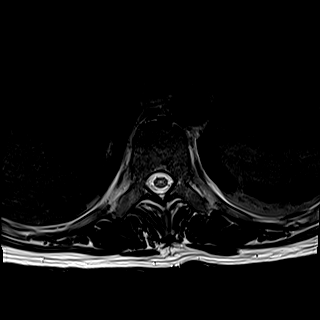
[im 20/39]
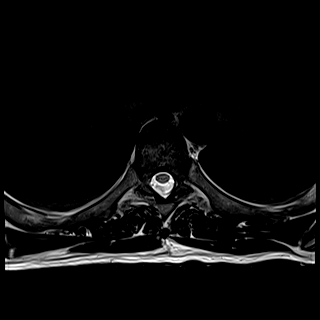
[im 29/39]
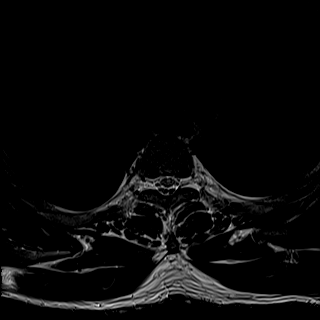
[im 39/39]
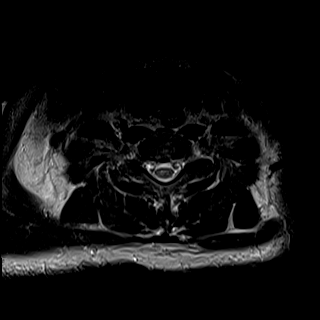

[Series 28: T1 · axial · non-contrast · 5.0mm · 0.31mm/px · z∈[-356,-127]mm · 5 of 39 slices shown (3 of 3)]
[im 1/39]
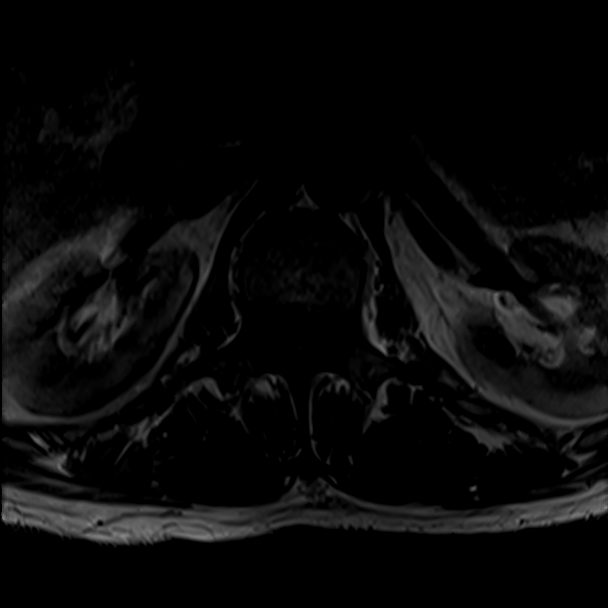
[im 10/39]
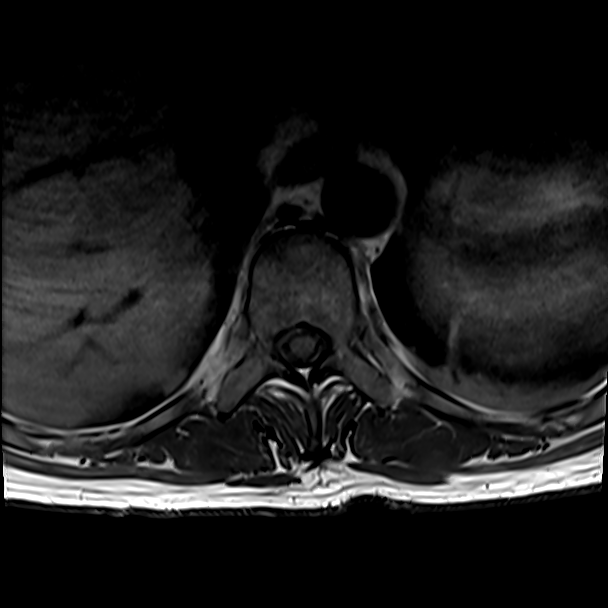
[im 20/39]
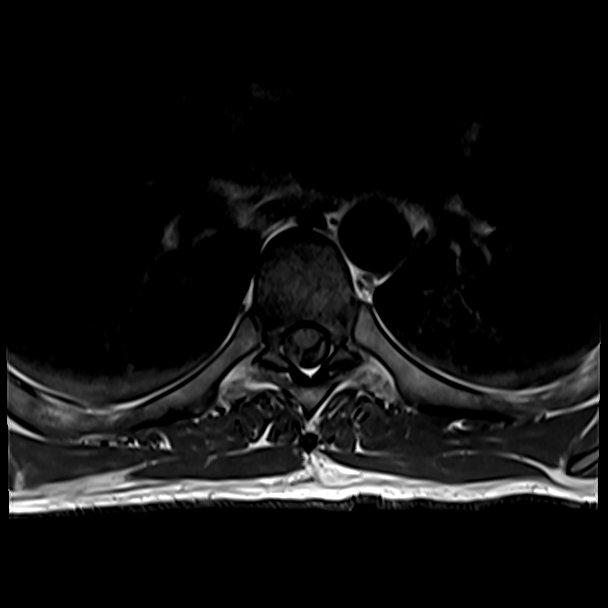
[im 29/39]
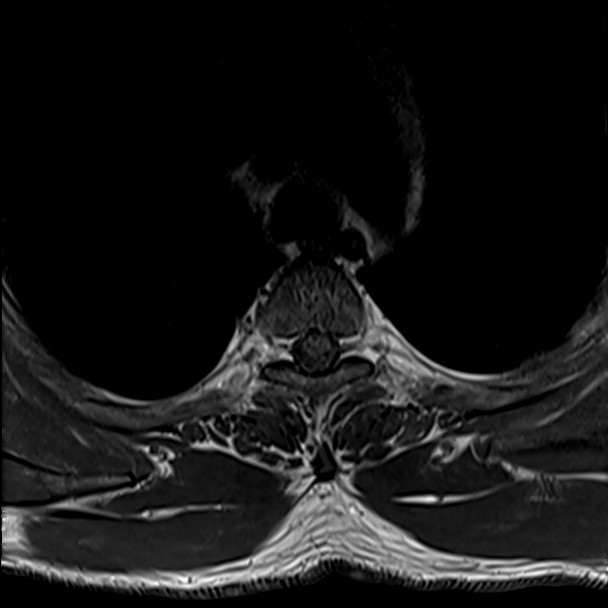
[im 39/39]
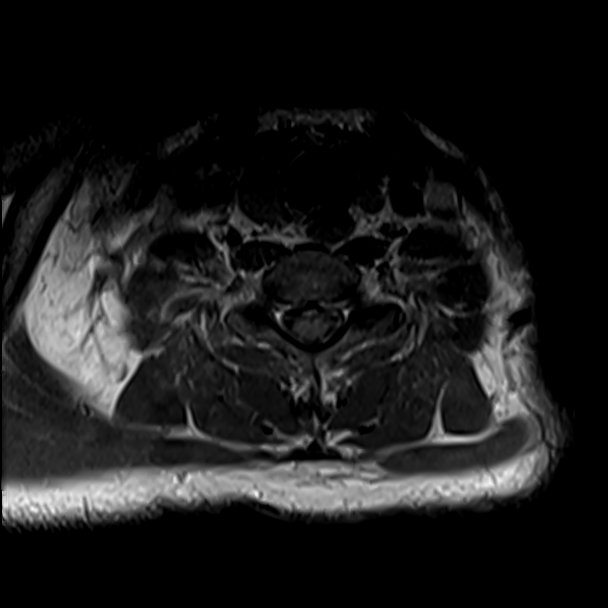

[Series 29: T1 fat-sat post-contrast · sagittal · 3.0mm · 0.76mm/px · 2 of 17 slices shown]
[im 1/17]
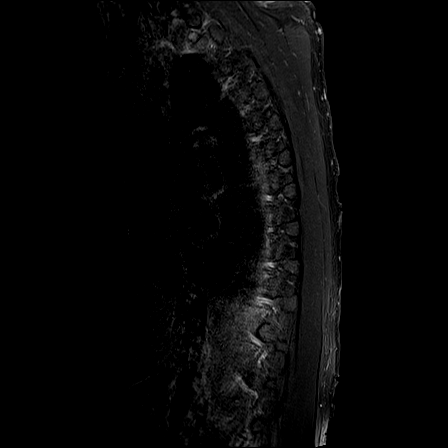
[im 17/17]
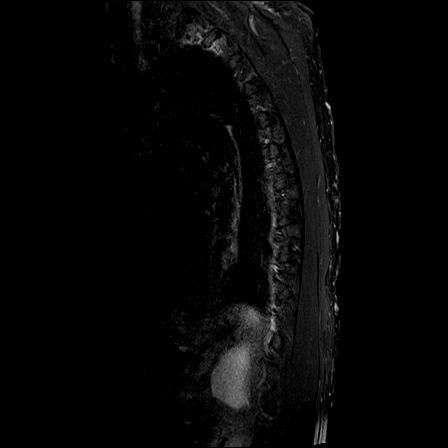

[23 of 48 positions shown; findings below may reference images not displayed]

FINDINGS: Limited cervical spine imaging:  Reported separately today.

Thoracic spine segmentation:  Appears normal.

Alignment:  Preserved thoracic kyphosis.  No spondylolisthesis.

Vertebrae: No marrow edema or evidence of acute osseous abnormality.
Visualized bone marrow signal is within normal limits.

Cord: Intermittent central thoracic spinal cord T2 hyperintensity
from the T5-T6 level through T9-T10. The abnormality never measures
more than 1-2 mm (series 26, image 21 at T7).

There is no associated thoracic spinal cord expansion or cord edema.

There is no associated intra-axial cord enhancement.

No spinal cord leptomeningeal or other abnormal intradural
enhancement is identified. No dural thickening.

The conus medullaris remains normal at T12-L1.

Paraspinal and other soft tissues: Trace layering pleural fluid with
mild lower lung atelectasis suspected. Negative visible thoracic
inlet. Otherwise negative visible thoracic and upper abdominal
viscera.

Increased conspicuity of the exiting T1 nerves again noted on series
28, image 4 as seen in the cervical spine. Other thoracic paraspinal
soft tissues are within normal limits. No thoracic paraspinal mass
identified.

Disc levels:

Mild for age degeneration in the thoracic spine as seen in the
cervical spine. There is mild upper thoracic facet hypertrophy. No
thoracic spinal stenosis or convincing neural impingement.
IMPRESSION: 1. Positive for intermittent small thoracic spinal cord syrinx.
2. But negative for cord expansion, cord edema, intra-axial or
leptomeningeal metastatic disease in the thoracic spine.
3. Mild exiting T1 nerve prominence as seen in the cervical spine.
Otherwise no thoracic paraspinal tumor.
4. Mild for age thoracic spine degeneration with no spinal stenosis
or neural impingement.

## 2019-03-18 IMAGING — CT CT HEAD W/O CM
4 series · 17 of 47 positions shown, 19 images · non-contrast
Comparison: MRI from 2 days ago

CLINICAL DATA: Status post drain/biopsy.

EXAM:
CT HEAD WITHOUT CONTRAST
TECHNIQUE: Contiguous axial images were obtained from the base of the skull
through the vertex without intravenous contrast.

[Series 3: head wo · axial · 0.43mm/px · z∈[-124,+12]mm · 7 of 37 slices shown, 9 images]
[im 5/37  brain]
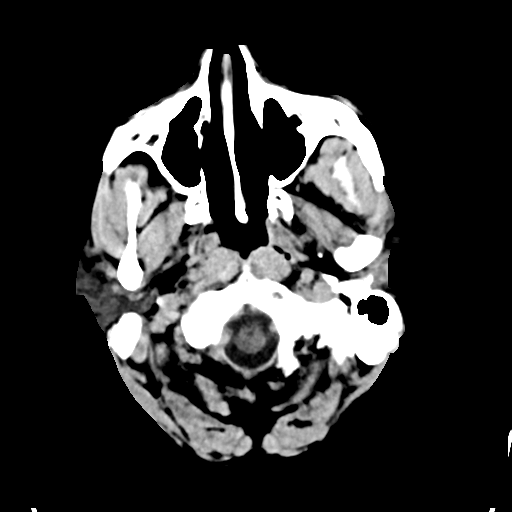
[im 5/37  bone]
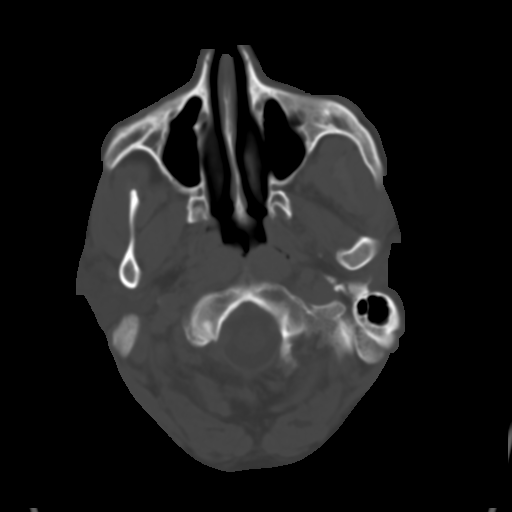
[im 10/37  brain]
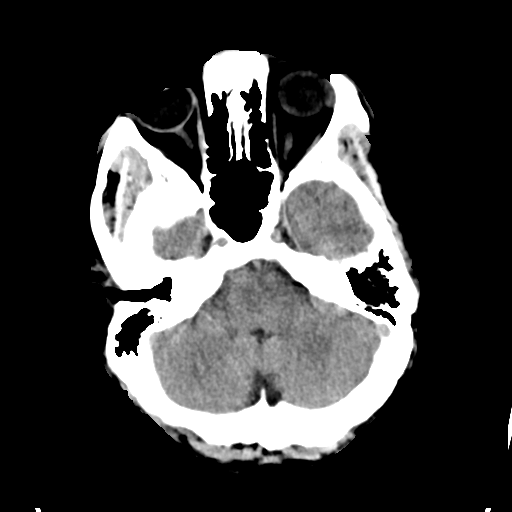
[im 14/37  brain]
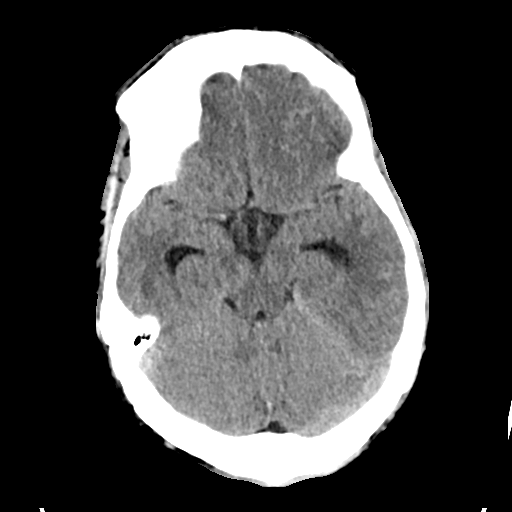
[im 19/37  brain]
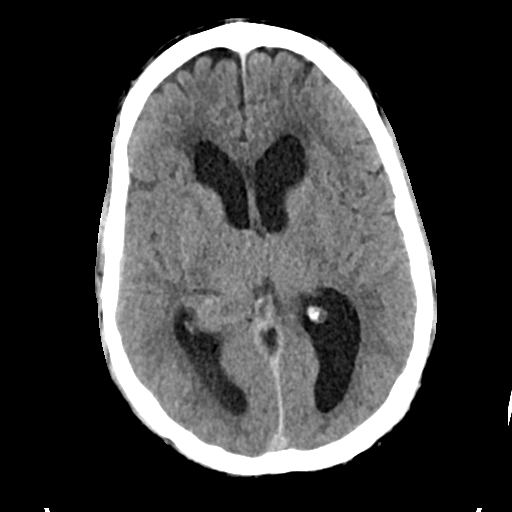
[im 23/37  brain]
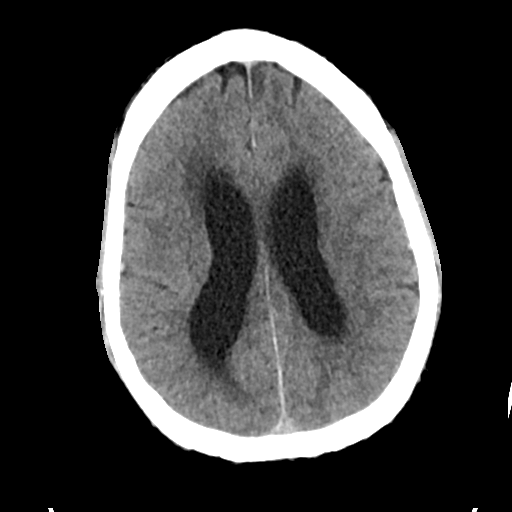
[im 23/37  bone]
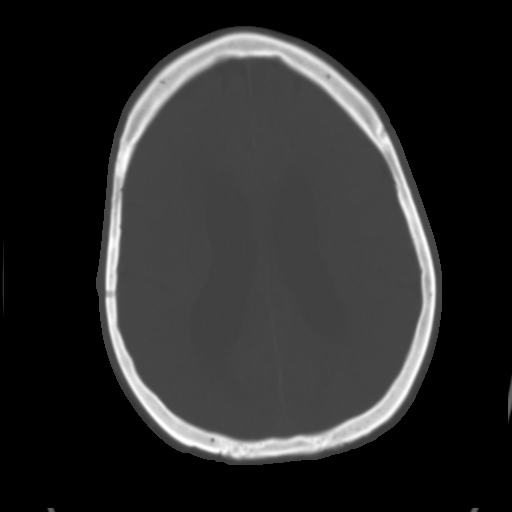
[im 28/37  brain]
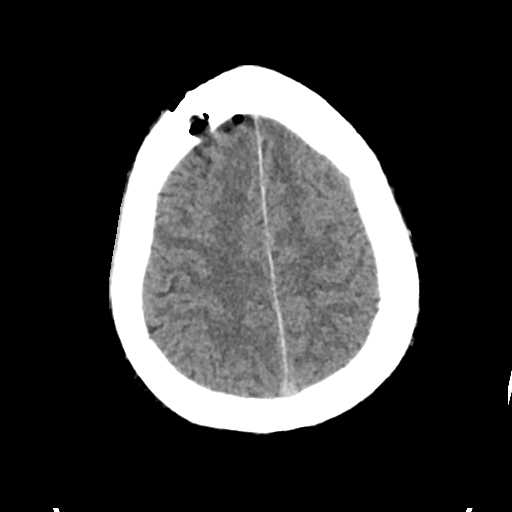
[im 32/37  brain]
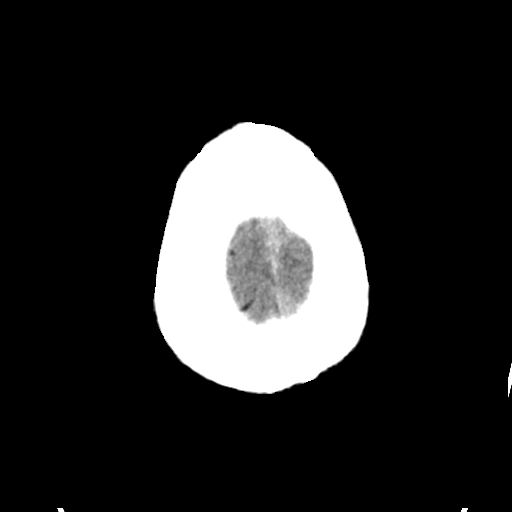

[Series 4: head bone · axial · 0.43mm/px · z∈[-126,-62]mm · 4 of 93 slices shown]
[im 10/93  bone]
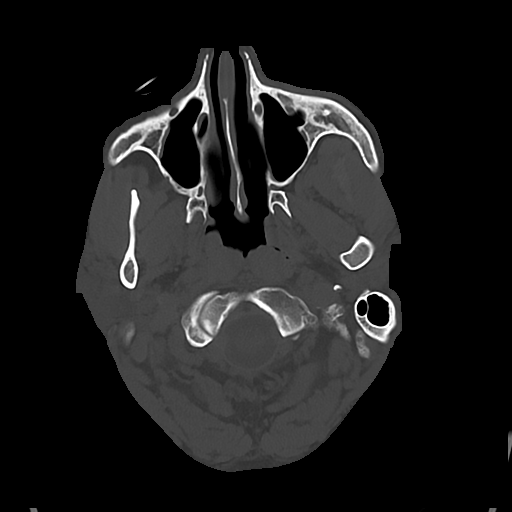
[im 19/93  bone]
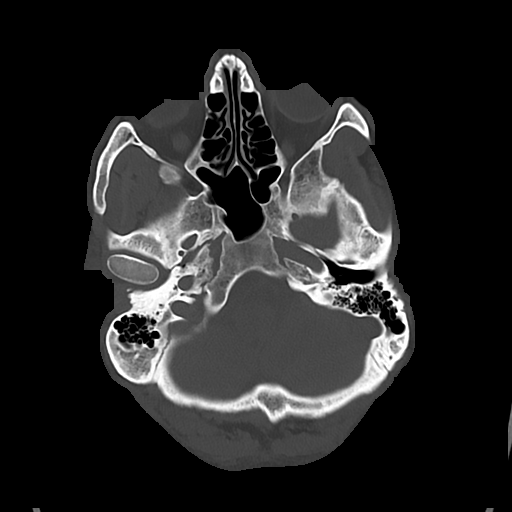
[im 28/93  bone]
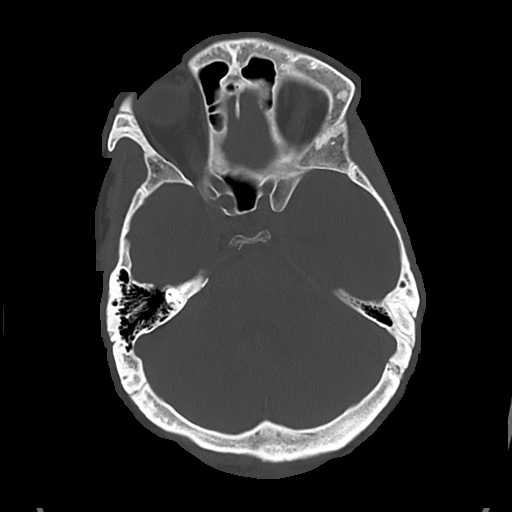
[im 42/93  bone]
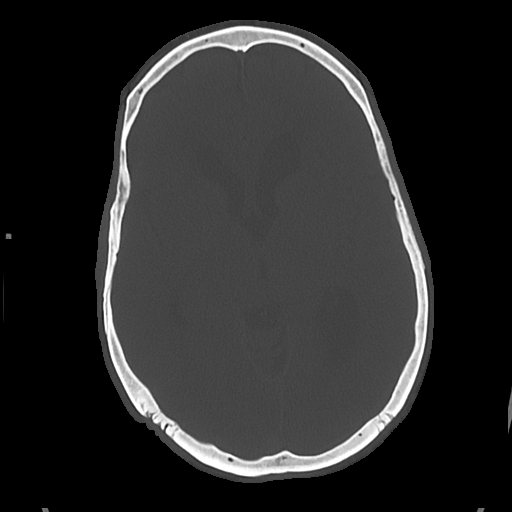

[Series 5: cor soft · coronal · 0.37mm/px · 3 of 73 slices shown]
[im 25/73  brain]
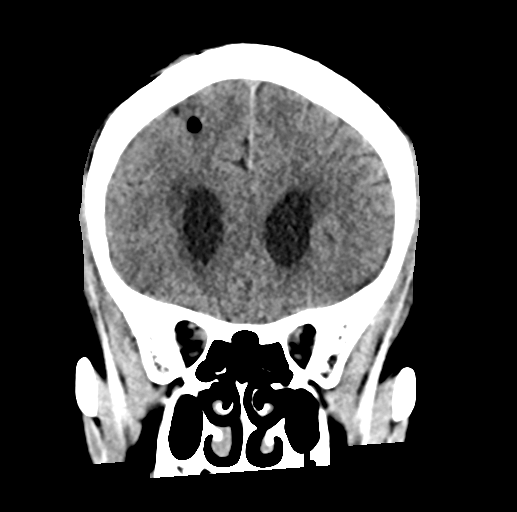
[im 33/73  brain]
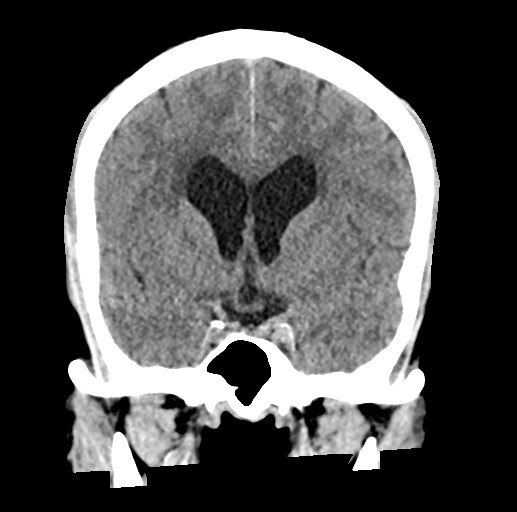
[im 41/73  brain]
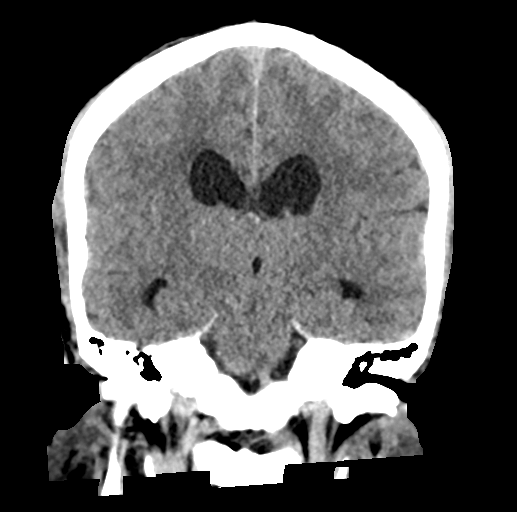

[Series 6: sag soft · sagittal · 0.39mm/px · 3 of 59 slices shown]
[im 20/59  brain]
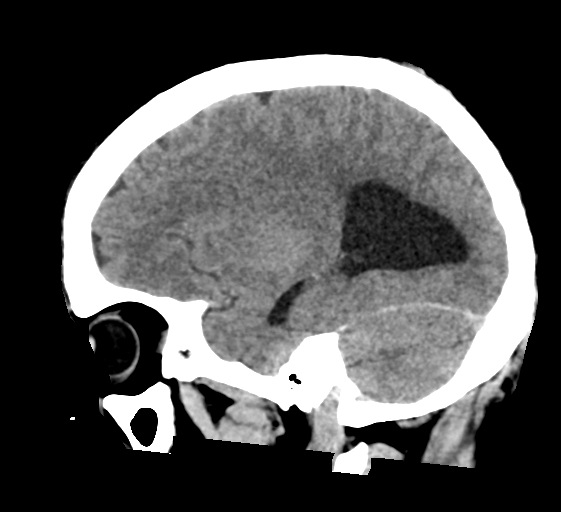
[im 30/59  brain]
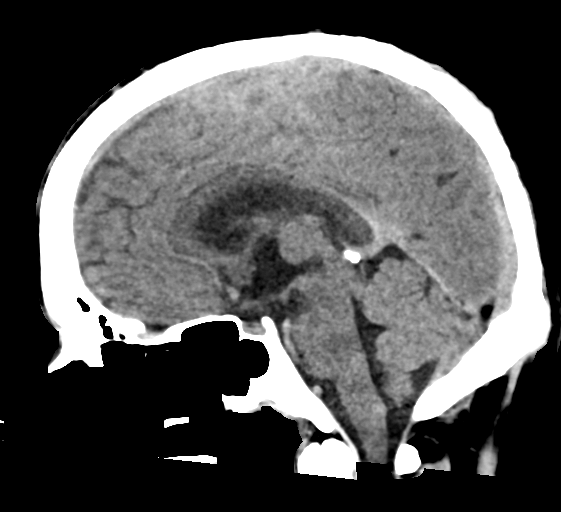
[im 39/59  brain]
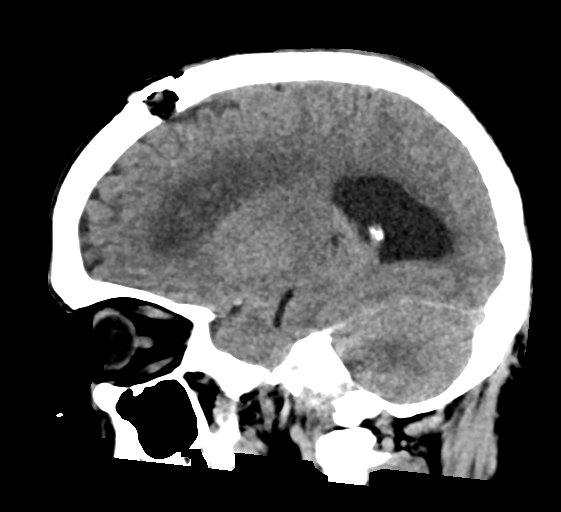

[17 of 47 positions shown; findings below may reference images not displayed]

FINDINGS: Brain: Improved ventriculomegaly after third ventriculostomy.

Indistinct mass centered at the right posterior thalamus with
involvement of the upper cerebral aqua duct and lower third
ventricle by MRI. There is history of neurofibromatosis and glioma
is a leading consideration, although enhancement pattern and dense
cellularity could also be from lymphoma. Periventricular low-density
is likely from transependymal CSF flow and subjectively improved. No
complicating infarct or hemorrhage.

Vascular: Negative

Skull: Unremarkable right frontal burr hole

Sinuses/Orbits: Negative
IMPRESSION: Improved ventriculomegaly after third ventriculostomy. No
complicating hemorrhage or infarct.

## 2019-03-18 IMAGING — MR MR CERVICAL SPINE WO/W CM
5 of 6 series · 29 of 48 positions shown · IV contrast (gadavist)
Comparison: Brain MRI [DATE] and earlier.

CLINICAL DATA: 44-year-old male with neurofibromatosis. Right
thalamic mass with ependymal intracranial spread.

EXAM:
MRI CERVICAL SPINE WITHOUT AND WITH CONTRAST
TECHNIQUE: Multiplanar and multiecho pulse sequences of the cervical spine, to
include the craniocervical junction and cervicothoracic junction,
were obtained without and with intravenous contrast.
CONTRAST:  8mL GADAVIST GADOBUTROL 1 MMOL/ML IV SOLN

[Series 1: T2 · sagittal · 3.0mm · 0.69mm/px · 4 of 15 slices shown (1 of 2)]
[im 1/15]
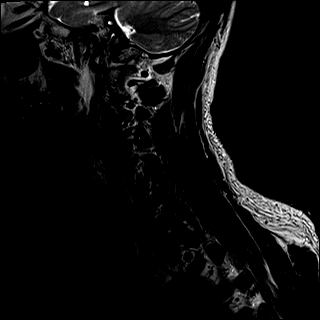
[im 5/15]
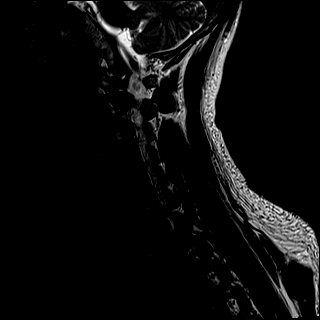
[im 10/15]
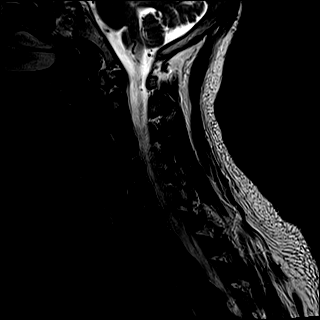
[im 15/15]
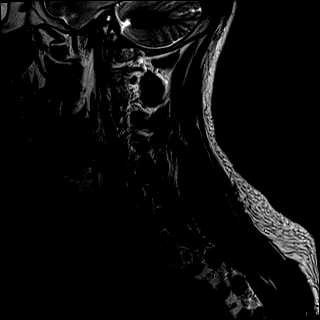

[Series 3: STIR · sagittal · 3.0mm · 0.86mm/px · 4 of 15 slices shown]
[im 1/15]
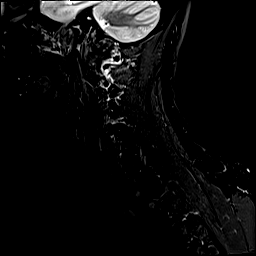
[im 5/15]
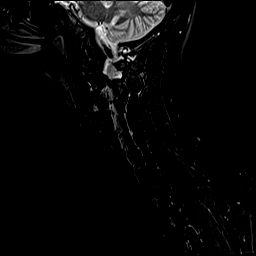
[im 10/15]
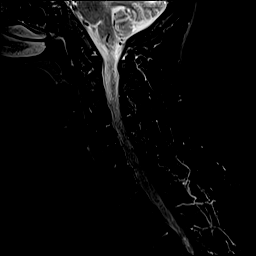
[im 15/15]
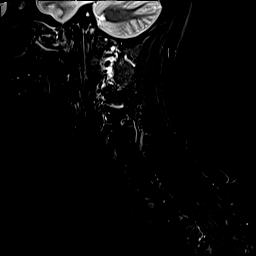

[Series 5: T1 · axial · 3.0mm · 0.35mm/px · z∈[-144,-31]mm · 9 of 39 slices shown]
[im 1/39]
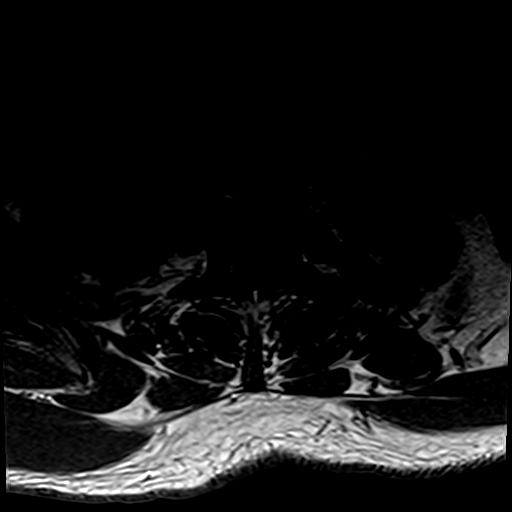
[im 7/39]
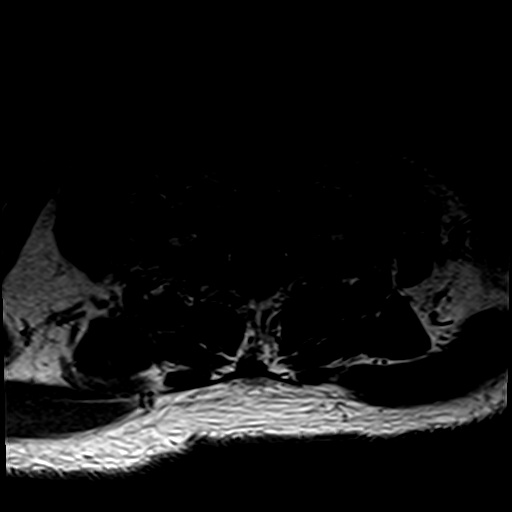
[im 11/39]
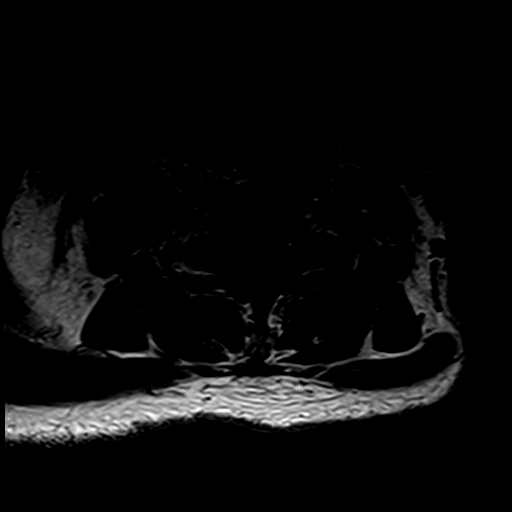
[im 18/39]
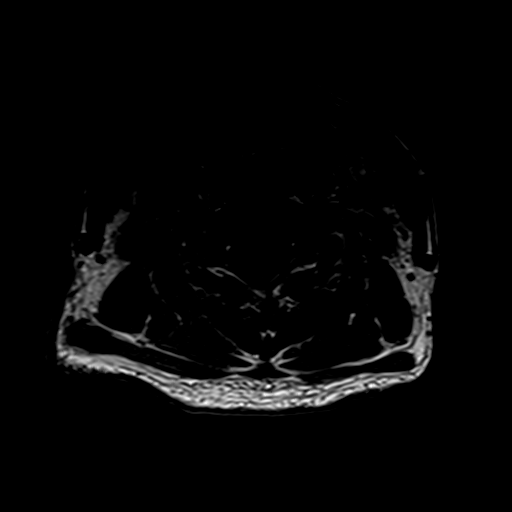
[im 21/39]
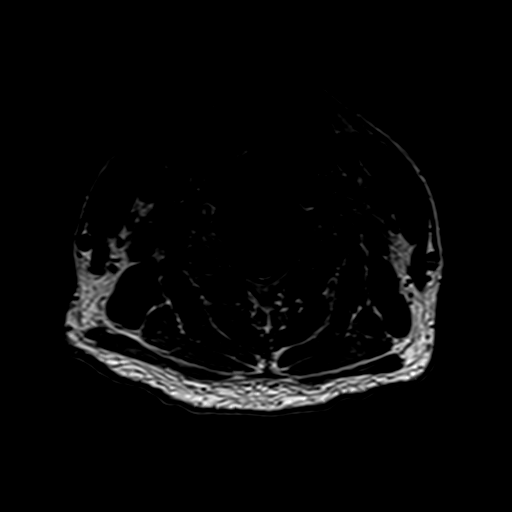
[im 28/39]
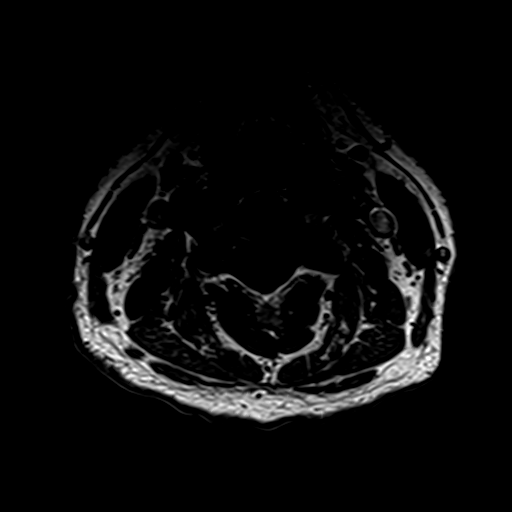
[im 32/39]
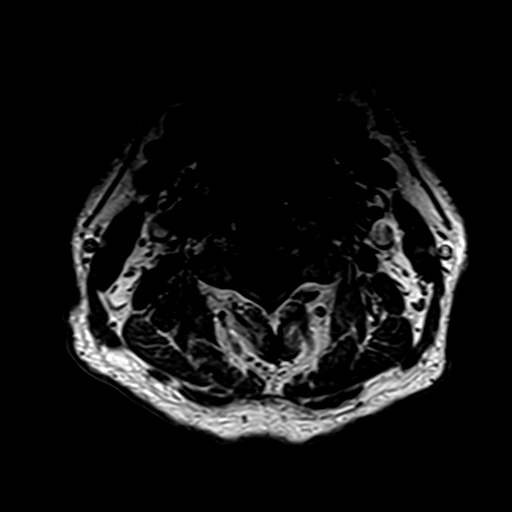
[im 35/39]
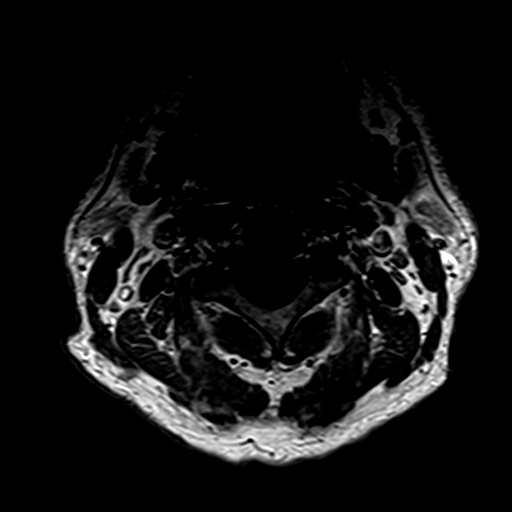
[im 39/39]
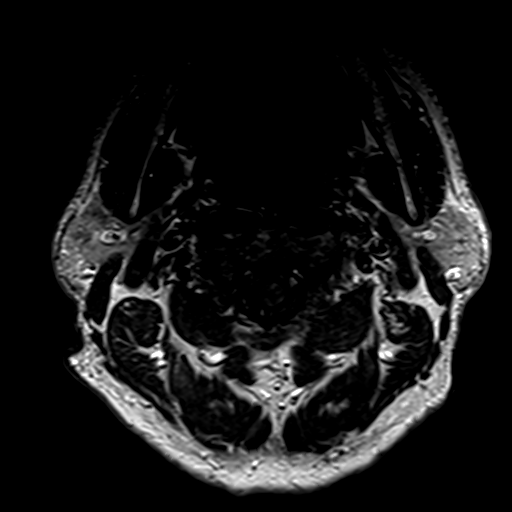

[Series 7: T1 post-contrast · axial · 3.0mm · 0.35mm/px · z∈[-144,-114]mm · 3 of 39 slices shown]
[im 1/39]
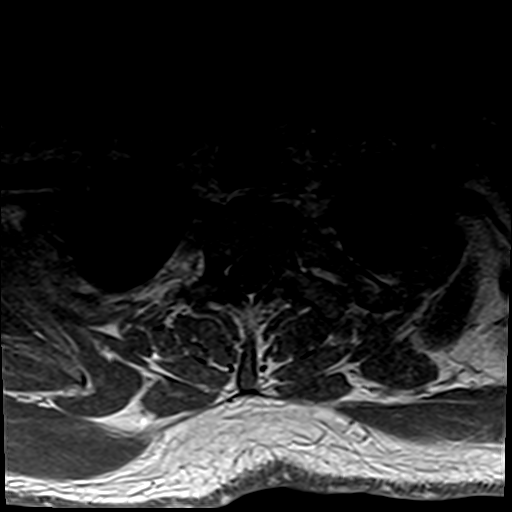
[im 7/39]
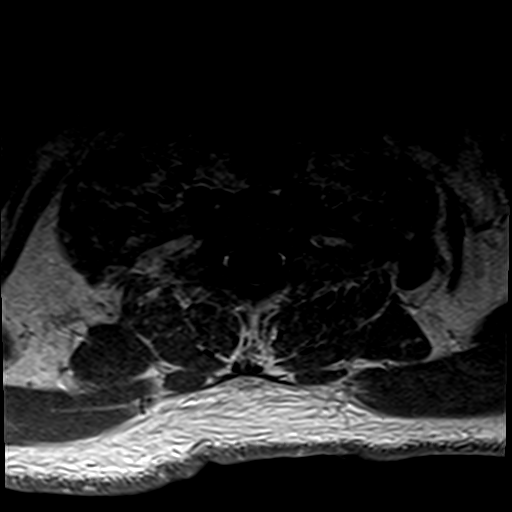
[im 11/39]
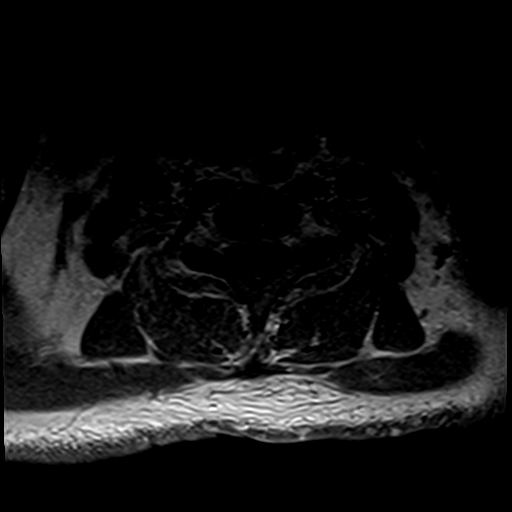

[Series 18: T2 · axial · 3.0mm · 0.66mm/px · z∈[-149,-36]mm · 9 of 39 slices shown (2 of 2)]
[im 1/39]
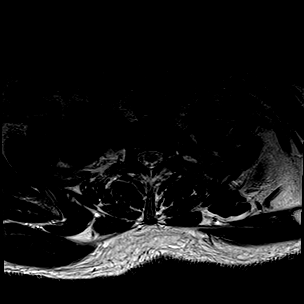
[im 7/39]
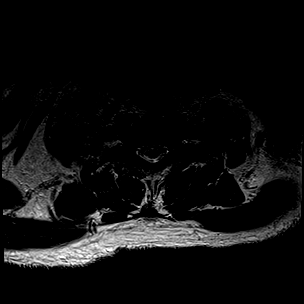
[im 11/39]
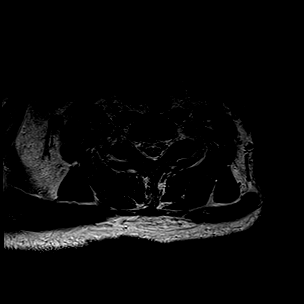
[im 18/39]
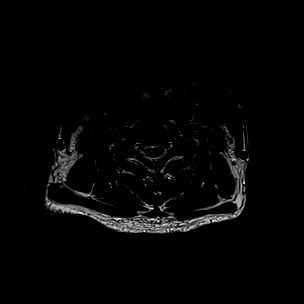
[im 21/39]
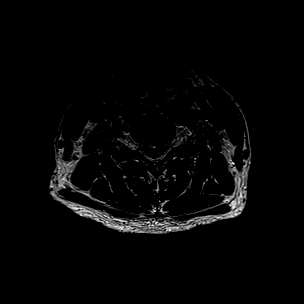
[im 28/39]
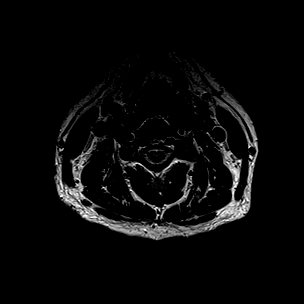
[im 32/39]
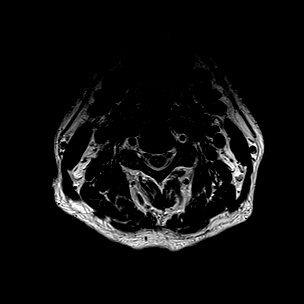
[im 35/39]
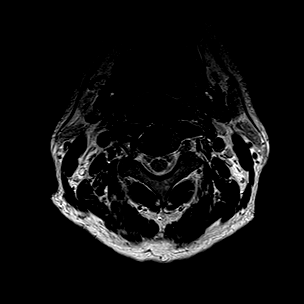
[im 39/39]
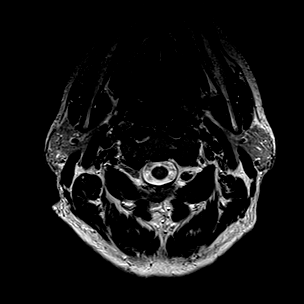

[29 of 48 positions shown; findings below may reference images not displayed]

FINDINGS: Alignment: Preserved cervical lordosis.

Vertebrae: No marrow edema or evidence of acute osseous abnormality.
Visualized bone marrow signal is within normal limits.

Cord: Visible cervical and upper thoracic spinal cord signal and
morphology are within normal limits. No abnormal intradural
enhancement is identified. No dural thickening.

Posterior Fossa, vertebral arteries, paraspinal tissues:
Cervicomedullary junction is within normal limits. No definite
posterior fossa leptomeningeal enhancement identified on these
images.

Preserved major vascular flow voids in the neck. The right vertebral
artery appears mildly dominant.

No cervical paraspinal or neck mass is identified. There is
increased conspicuity of the bilateral exiting T1 nerves in the
upper thoracic spine (series 5, images 35 and 36), which may be
mildly hyperenhancing. Negative visible lung apices.

Disc levels:

Generally mild for age cervical spine degeneration. There is
broad-based rightward disc bulge or protrusion at C6-C7 with some
endplate spurring resulting in mild to moderate right C7 neural
foraminal stenosis. No cervical spinal stenosis or other neural
impingement identified.
IMPRESSION: 1. No leptomeningeal disease identified in the cervical spine.
Normal cervical spinal cord.
2. Questionable mild bilateral T1 exiting nerve neurofibromas.
3. Mild for age cervical spine degeneration, disc and endplate
related mild to moderate right C7 neural foraminal stenosis.
4. Thoracic and lumbar MRI today reported separately.

## 2019-03-18 MED ORDER — GADOBUTROL 1 MMOL/ML IV SOLN
8.0000 mL | Freq: Once | INTRAVENOUS | Status: AC | PRN
  Administered 2019-03-18: 8 mL via INTRAVENOUS

## 2019-03-18 MED ORDER — DEXAMETHASONE 2 MG PO TABS: 2.0000 mg | ORAL_TABLET | Freq: Two times a day (BID) | ORAL | Status: DC

## 2019-03-18 MED ORDER — ENOXAPARIN SODIUM 40 MG/0.4ML ~~LOC~~ SOLN
40.0000 mg | SUBCUTANEOUS | Status: DC
  Administered 2019-03-18 – 2019-03-19 (×2): 40 mg via SUBCUTANEOUS
  Filled 2019-03-18: qty 0.4

## 2019-03-18 MED ORDER — DEXAMETHASONE 2 MG PO TABS: 2.0000 mg | ORAL_TABLET | Freq: Three times a day (TID) | ORAL | Status: DC

## 2019-03-18 MED ORDER — DEXAMETHASONE 0.5 MG PO TABS: 1.0000 mg | ORAL_TABLET | Freq: Two times a day (BID) | ORAL | Status: DC

## 2019-03-18 MED ORDER — DEXAMETHASONE 6 MG PO TABS
6.0000 mg | ORAL_TABLET | Freq: Four times a day (QID) | ORAL | Status: DC
  Administered 2019-03-18 – 2019-03-19 (×5): 6 mg via ORAL
  Filled 2019-03-18 (×7): qty 1

## 2019-03-18 MED ORDER — LEVETIRACETAM 500 MG PO TABS
500.0000 mg | ORAL_TABLET | Freq: Two times a day (BID) | ORAL | Status: DC
  Administered 2019-03-18 – 2019-03-19 (×3): 500 mg via ORAL
  Filled 2019-03-18 (×3): qty 1

## 2019-03-18 MED ORDER — DEXAMETHASONE 4 MG PO TABS: 4.0000 mg | ORAL_TABLET | Freq: Four times a day (QID) | ORAL | Status: DC

## 2019-03-18 MED ORDER — DEXAMETHASONE 4 MG PO TABS: 4.0000 mg | ORAL_TABLET | Freq: Three times a day (TID) | ORAL | Status: DC

## 2019-03-18 MED ORDER — DEXAMETHASONE 4 MG PO TABS: 4.0000 mg | ORAL_TABLET | Freq: Two times a day (BID) | ORAL | Status: DC

## 2019-03-18 MED ORDER — DEXAMETHASONE 0.5 MG PO TABS: 1.0000 mg | ORAL_TABLET | Freq: Every day | ORAL | Status: DC

## 2019-03-18 MED ORDER — DEXAMETHASONE 4 MG PO TABS
4.0000 mg | ORAL_TABLET | Freq: Four times a day (QID) | ORAL | Status: DC
  Filled 2019-03-18: qty 1

## 2019-03-18 MED ORDER — SODIUM CHLORIDE 0.9 % IV SOLN
INTRAVENOUS | Status: DC | PRN
  Administered 2019-03-18: 02:00:00 250 mL via INTRAVENOUS

## 2019-03-18 NOTE — Evaluation (Signed)
Occupational Therapy Evaluation Patient Details Name: Aaron Espinoza MRN: [id number] DOB: 09-21-1975 Today's Date: 03/18/2019    History of Present Illness Pt is a 44 yo male iwth likely neurofibromatosis who presented with increasing AMS and imbalance. MRI showed a right thalamic/midbrain infiltrating lesion with obstructive hydrocephalus s/p stereotactic biopsy and ETV 2/10.    Clinical Impression   PTA pt independent, working at [employer] and needed no assist for mobility. He began experiencing increased cognitive decline and mobility impairments prior to admission. At time of eval, pt presents with cognitive deficits, L sided sensory deficits, visual changes, and decreased balance. He is able to complete bed mobility at mod I and transfers at supervision level. Pt needs max safety cues to manage impulsivity. At start of session he began to get out of bed over rail, and was pulling all of bedding off with L hand without awareness (due to neglect but also can be fall hazard). When asked about this to gain insight he was unaware, and did not apply as to how this could have been a safety hazard. Pt was able to complete functional mobility beyond household distance with higher level balance deficits noted. He was able to topographically orient self back to room with min cues and speaking out loud to himself. He appears to have impaired attention, executive functioning, and overall decreased processing speed. Noted difficulty with peripheral vision on assessment too, but will need continued assessment. Given current level, recommend OP neuro OT to address higher level needs in terms of BADL/IADL and safety. Will continue to follow per POC listed below.    Follow Up Recommendations  Outpatient OT;Supervision/Assistance - 24 hour (Neuro OT)   Equipment Recommendations  None recommended by OT    Recommendations for Other Services       Precautions / Restrictions Precautions Precautions:  Fall Precaution Comments: L sided sensory involvement      Mobility Bed Mobility Overal bed mobility: Modified Independent                Transfers Overall transfer level: Needs assistance Equipment used: None Transfers: Sit to/from Stand Sit to Stand: Supervision         General transfer comment: supervision for safety and managing impulsivity    Balance Overall balance assessment: Needs assistance Sitting-balance support: No upper extremity supported Sitting balance-Leahy Scale: Good     Standing balance support: No upper extremity supported;During functional activity Standing balance-Leahy Scale: Fair                 High Level Balance Comments: scanning, abrupt directional changes, backing up and increasing gait speed all produces at least mild deviation..           ADL either performed or assessed with clinical judgement   ADL Overall ADL's : Needs assistance/impaired Eating/Feeding: Set up;Sitting   Grooming: Supervision/safety;Standing;Wash/dry hands   Upper Body Bathing: Supervision/ safety;Sitting;Standing   Lower Body Bathing: Supervison/ safety;Sit to/from stand   Upper Body Dressing : Supervision/safety;Sitting;Standing   Lower Body Dressing: Supervision/safety;Sit to/from stand;Sitting/lateral leans;Cueing for safety Lower Body Dressing Details (indicate cue type and reason): able to fix socks in standing and hanging onto rail in hallway; needing mod cues to maintain safety Toilet Transfer: Min guard;Cueing for Administrator, sports Details (indicate cue type and reason): impulsive with transder, needing mod- max safety cues but able to physically manage standing to urinate without assist Toileting- Clothing Manipulation and Hygiene: Supervision/safety   Tub/ Shower Transfer: Min guard;Ambulation   Functional  mobility during ADLs: Min guard;Supervision/safety;Cueing for safety;Cueing for sequencing General ADL  Comments: pt limited by cognitive deficits and L sided sensory deficits     Vision Baseline Vision/History: Wears glasses Wears Glasses: At all times Patient Visual Report: Other (comment)(reports feeling "off equilibirum") Vision Assessment?: Vision impaired- to be further tested in functional context Additional Comments: pt having difficulty identifying numbers in periphery without additional eye/head turns. He states his periphery feels "out of focus"     Perception     Praxis      Pertinent Vitals/Pain Pain Assessment: No/denies pain Faces Pain Scale: No hurt Pain Intervention(s): Monitored during session     Hand Dominance Right   Extremity/Trunk Assessment Upper Extremity Assessment Upper Extremity Assessment: LUE deficits/detail LUE Deficits / Details: decreased sensory, suspect neglect and proprioceptive combo LUE Sensation: decreased proprioception LUE Coordination: decreased fine motor;decreased gross motor   Lower Extremity Assessment Lower Extremity Assessment: Defer to PT evaluation       Communication Communication Communication: No difficulties   Cognition Arousal/Alertness: Awake/alert Behavior During Therapy: Flat affect;Impulsive Overall Cognitive Status: Impaired/Different from baseline Area of Impairment: Attention;Awareness;Problem solving;Safety/judgement;Memory                   Current Attention Level: Sustained Memory: Decreased short-term memory   Safety/Judgement: Decreased awareness of deficits;Decreased awareness of safety Awareness: Intellectual Problem Solving: Slow processing;Decreased initiation;Difficulty sequencing;Requires verbal cues;Requires tactile cues General Comments: pt presents with flat affect, minimal response regulation and easily mildly irritable. Has little awareness of current deficits and their impact on safety and BADL function. Was able to topographically orient self back to room with min VC's and talking out  loud to self to process   General Comments  vss on RA    Exercises     Shoulder Instructions      Home Living Family/patient expects to be discharged to:: Private residence Living Arrangements: Spouse/significant other;Children Available Help at Discharge: Family;Available 24 hours/day Type of Home: House       Home Layout: One level     Bathroom Shower/Tub: Teacher, early years/pre: Handicapped height     Home Equipment: None      Lives With: Spouse;Daughter    Prior Functioning/Environment Level of Independence: Independent        Comments: independent works at [employer]; usually drives but has not recently since decline        OT Problem List: Decreased strength;Impaired vision/perception;Decreased knowledge of use of DME or AE;Decreased coordination;Decreased knowledge of precautions;Decreased activity tolerance;Decreased cognition;Impaired UE functional use;Impaired balance (sitting and/or standing);Decreased safety awareness      OT Treatment/Interventions: Self-care/ADL training;Visual/perceptual remediation/compensation;Therapeutic exercise;Patient/family education;Balance training;Neuromuscular education;Energy conservation;Therapeutic activities;DME and/or AE instruction;Cognitive remediation/compensation    OT Goals(Current goals can be found in the care plan section) Acute Rehab OT Goals Patient Stated Goal: Independent and back to work OT Goal Formulation: With patient Time For Goal Achievement: 04/01/19 Potential to Achieve Goals: Good  OT Frequency: Min 2X/week   Barriers to D/C:            Co-evaluation PT/OT/SLP Co-Evaluation/Treatment: Yes Reason for Co-Treatment: For patient/therapist safety;To address functional/ADL transfers PT goals addressed during session: Mobility/safety with mobility;Balance OT goals addressed during session: ADL's and self-care;Strengthening/ROM;Other (comment)(cognition)      AM-PAC OT "6 Clicks"  Daily Activity     Outcome Measure Help from another person eating meals?: None Help from another person taking care of personal grooming?: None Help from another person toileting, which includes using toliet, bedpan,  or urinal?: A Little Help from another person bathing (including washing, rinsing, drying)?: A Little Help from another person to put on and taking off regular upper body clothing?: None Help from another person to put on and taking off regular lower body clothing?: A Little 6 Click Score: 21   End of Session Equipment Utilized During Treatment: Gait belt Nurse Communication: Mobility status  Activity Tolerance: Patient tolerated treatment well Patient left: in chair;with call bell/phone within reach;with chair alarm set;with nursing/sitter in room  OT Visit Diagnosis: Unsteadiness on feet (R26.81);Other abnormalities of gait and mobility (R26.89);Muscle weakness (generalized) (M62.81);Other symptoms and signs involving cognitive function;Cognitive communication deficit (R41.841) Symptoms and signs involving cognitive functions: Other cerebrovascular disease(tumor)                Time: 3343-5686 OT Time Calculation (min): 25 min Charges:  OT General Charges $OT Visit: 1 Visit OT Evaluation $OT Eval Moderate Complexity: 1 Mod  Zenovia Jarred, MSOT, OTR/L Acute Rehabilitation Services [facility] Office Number: [phone]  Zenovia Jarred 03/18/2019, 6:11 PM

## 2019-03-18 NOTE — Progress Notes (Signed)
Physical Therapy Evaluation Patient Details Name: Aaron Espinoza MRN: [id number] DOB: 1975-05-25 Today's Date: 03/18/2019   History of Present Illness  Pt is a 44 yo male iwth likely neurofibromatosis who presented with increasing AMS and imbalance. MRI showed a right thalamic/midbrain infiltrating lesion with obstructive hydrocephalus s/p stereotactic biopsy and ETV 2/10.   Clinical Impression  Pt admitted with/for AMS and imbalance with intervention as described above.  Pt shows elements of neglect and inattention, some proprioceptive deficits and disequilibrium.  He needs min guard assist at worst for mobility and gait.  Pt currently limited functionally due to the problems listed below.  (see problems list.)  Pt will benefit from PT to maximize function and safety to be able to get home safely with available assist.     Follow Up Recommendations Outpatient PT;Supervision/Assistance - 24 hour    Equipment Recommendations  None recommended by PT    Recommendations for Other Services       Precautions / Restrictions Precautions Precautions: Fall      Mobility  Bed Mobility Overal bed mobility: Modified Independent                Transfers Overall transfer level: Needs assistance   Transfers: Sit to/from Stand Sit to Stand: Supervision;Modified independent (Device/Increase time)         General transfer comment: up with decrease reguard for covering/obstacles  Ambulation/Gait Ambulation/Gait assistance: Min guard;Supervision Gait Distance (Feet): 300 Feet Assistive device: None Gait Pattern/deviations: Step-through pattern Gait velocity: moderate, no quite age appropriate yet. Gait velocity interpretation: >2.62 ft/sec, indicative of community ambulatory General Gait Details: episodes of unsteadiness, wider BOS, mild drift and stagger L with scanning and abrupt directional changes.  With increases in speed, pt's gait changed from relatively smooth to "loping" on  the left.  Stairs            Wheelchair Mobility    Modified Rankin (Stroke Patients Only)       Balance Overall balance assessment: Needs assistance Sitting-balance support: No upper extremity supported Sitting balance-Leahy Scale: Good       Standing balance-Leahy Scale: Fair                 High Level Balance Comments: scanning, abrupt directional changes, backing up and increasing gait speed all produces at least mild deviation..             Pertinent Vitals/Pain Pain Assessment: Faces Faces Pain Scale: No hurt Pain Intervention(s): Monitored during session    Home Living Family/patient expects to be discharged to:: Private residence Living Arrangements: Spouse/significant other;Children Available Help at Discharge: Family;Available 24 hours/day Type of Home: House       Home Layout: One level Home Equipment: None      Prior Function Level of Independence: Independent         Comments: independent works at [employer]; usually drives but has not recently since decline     Hand Dominance   Dominant Hand: Right    Extremity/Trunk Assessment   Upper Extremity Assessment Upper Extremity Assessment: Defer to OT evaluation    Lower Extremity Assessment Lower Extremity Assessment: Overall WFL for tasks assessed(mild incoordination L LE> R LE)       Communication   Communication: No difficulties  Cognition Arousal/Alertness: Awake/alert   Overall Cognitive Status: Impaired/Different from baseline Area of Impairment: Attention;Awareness;Problem solving;Safety/judgement                   Current Attention Level: Selective  Safety/Judgement: Decreased awareness of deficits;Decreased awareness of safety Awareness: Emergent Problem Solving: Slow processing        General Comments General comments (skin integrity, edema, etc.): vss on RA    Exercises     Assessment/Plan    PT Assessment Patient needs continued PT  services  PT Problem List Decreased balance;Decreased mobility;Decreased coordination;Decreased safety awareness;Decreased activity tolerance       PT Treatment Interventions Gait training;Stair training;Functional mobility training;Therapeutic activities;Balance training;Patient/family education    PT Goals (Current goals can be found in the Care Plan section)  Acute Rehab PT Goals Patient Stated Goal: Independent and back to work PT Goal Formulation: With patient Time For Goal Achievement: 03/25/19 Potential to Achieve Goals: Good    Frequency Min 3X/week   Barriers to discharge        Co-evaluation               AM-PAC PT "6 Clicks" Mobility  Outcome Measure Help needed turning from your back to your side while in a flat bed without using bedrails?: None Help needed moving from lying on your back to sitting on the side of a flat bed without using bedrails?: None Help needed moving to and from a bed to a chair (including a wheelchair)?: None Help needed standing up from a chair using your arms (e.g., wheelchair or bedside chair)?: None Help needed to walk in hospital room?: A Little Help needed climbing 3-5 steps with a railing? : A Little 6 Click Score: 22    End of Session   Activity Tolerance: Patient tolerated treatment well Patient left: in chair;with call bell/phone within reach   PT Visit Diagnosis: Unsteadiness on feet (R26.81);Other symptoms and signs involving the nervous system (R29.898)    Time: 1031-5945 PT Time Calculation (min) (ACUTE ONLY): 25 min   Charges:   PT Evaluation $PT Eval Moderate Complexity: 1 Mod          03/18/2019  Ginger Carne., PT Acute Rehabilitation Services [phone]  (pager) [phone]  (office)  Tessie Fass [place] 03/18/2019, 4:54 PM

## 2019-03-18 NOTE — Progress Notes (Signed)
Subjective: Patient reports mild headache.  Objective: Vital signs in last 24 hours: Temp:  [97.6 F (36.4 C)-98.9 F (37.2 C)] 98.6 F (37 C) (02/11 0825) Pulse Rate:  [56-73] 65 (02/11 1000) Resp:  [9-25] 16 (02/11 1000) BP: (122-174)/(65-111) 144/70 (02/11 1000) SpO2:  [92 %-100 %] 97 % (02/11 1000) Arterial Line BP: (59-196)/(54-82) 60/55 (02/11 1000) Weight:  [81.1 kg] 81.1 kg (02/10 1420)  Intake/Output from previous day: 02/10 0701 - 02/11 0700 In: 4199.7 [I.V.:3849.7; IV Piggyback:350] Out: 0350 [Urine:1735] Intake/Output this shift: Total I/O In: 679.1 [P.O.:360; I.V.:319.1] Out: -   Awake, alert, Ox3.  On conversation, cognitive deficits and mild confusion noted Left sided pronator drift.  Mild right upper field finger counting difficulty. Incisions c/d  Lab Results: Recent Labs    03/17/19 1133 03/18/19 0616  WBC  --  14.9*  HGB 14.3 14.6  HCT 42.0 42.2  PLT  --  227   BMET Recent Labs    03/17/19 1133 03/18/19 0616  NA 139 139  K 4.3 4.2  CL  --  102  CO2  --  24  GLUCOSE  --  137*  BUN  --  9  CREATININE  --  0.77  CALCIUM  --  9.3    Studies/Results: CT HEAD WO CONTRAST  Result Date: 03/18/2019 CLINICAL DATA:  Status post drain/biopsy. EXAM: CT HEAD WITHOUT CONTRAST TECHNIQUE: Contiguous axial images were obtained from the base of the skull through the vertex without intravenous contrast. COMPARISON:  MRI from 2 days ago FINDINGS: Brain: Improved ventriculomegaly after third ventriculostomy. Indistinct mass centered at the right posterior thalamus with involvement of the upper cerebral aqua duct and lower third ventricle by MRI. There is history of neurofibromatosis and glioma is a leading consideration, although enhancement pattern and dense cellularity could also be from lymphoma. Periventricular low-density is likely from transependymal CSF flow and subjectively improved. No complicating infarct or hemorrhage. Vascular: Negative Skull:  Unremarkable right frontal burr hole Sinuses/Orbits: Negative IMPRESSION: Improved ventriculomegaly after third ventriculostomy. No complicating hemorrhage or infarct. Electronically Signed   By: Monte Fantasia M.D.   On: 03/18/2019 08:08   MR BRAIN W WO CONTRAST  Result Date: 03/17/2019 CLINICAL DATA:  Brain mass. Stereotactic protocol. EXAM: MRI HEAD WITHOUT AND WITH CONTRAST TECHNIQUE: Multiplanar, multiecho pulse sequences of the brain and surrounding structures were obtained without and with intravenous contrast. CONTRAST:  7.72m GADAVIST GADOBUTROL 1 MMOL/ML IV SOLN COMPARISON:  MRI of the brain March 08, 2019 FINDINGS: Brain: Again seen is a mass lesion centered on the right thalamus with ill defined margins extending inferiorly into the midbrain involving the cerebral aqueduct and posterior aspect of the third ventricle bilaterally, with mass effect resulting in obstructive hydrocephalus. There is also increased T2 signal in the fornices and hippocampi. The lesion shows patchy peripheral contrast enhancement which extends to the walls of the third ventricle and meeting the superior aspect of the cerebral aqueduct, suggesting subependymal spread. No restricted diffusion noted. The lesion measures approximately 4.6 by 3.7 by 3.2 cm (AP, T, cc), unchanged from prior. Periventricular T2 hyperintensity likely representing transependymal edema is unchanged. Vascular: Normal flow voids. Skull and upper cervical spine: Normal marrow signal. Sinuses/Orbits: Negative. Other: None. IMPRESSION: 1. Unchanged appearance of mass centered on the right thalamus with extension into the midbrain involving the cerebral aqueduct and posterior aspect of the third ventricle with evidence of subependymal spread. 2. Unchanged obstructive hydrocephalus with periventricular T2 hyperintensity likely representing transependymal edema. Electronically Signed  By: Pedro Earls M.D.   On: 03/17/2019 09:39     Assessment/Plan: 44 yo M with neurofibromatosis type 1 and right thalamic/midbrain lesion s/p biopsy and ETV for obstructive hydrocephalus POD#1 doing well. - will need continued SLP as outpatient - planning for screening spine MRIs today - PT/OT - will downgrade today with likely discharge tomorrow to allow time for the therapists to work with him further - f/u path   LOS: 1 day     Aaron Espinoza 03/18/2019, 10:46 AM

## 2019-03-18 NOTE — Evaluation (Signed)
Speech Language Pathology Evaluation Patient Details Name: Aaron Espinoza MRN: [id number] DOB: 07-Mar-1975 Today's Date: 03/18/2019 Time: 8675-4492 SLP Time Calculation (min) (ACUTE ONLY): 26 min  Problem List:  Patient Active Problem List   Diagnosis Date Noted  . Brain tumor ([place]) 03/17/2019  . NF (neurofibromatosis) ([place]) 07/20/2015  . BPH (benign prostatic hyperplasia) 07/20/2015  . ED (erectile dysfunction) 07/20/2015  . Seborrheic keratoses 07/20/2015  . Daniel angioma 07/20/2015   Past Medical History:  Past Medical History:  Diagnosis Date  . BPH (benign prostatic hyperplasia) 07/20/2015  . Brain mass   . Cognitive dysfunction 01/2019  . Fatigue 01/2019  . Headache   . NF (neurofibromatosis) ([place]) 07/20/2015   NF1   Past Surgical History:  Past Surgical History:  Procedure Laterality Date  . HAND SURGERY Left    pinky - tumor benign  . LEG SURGERY Left    inner thigh tumor - benign   HPI:  Pt is a 44 yo male iwth likely neurofibromatosis who presented with increasing AMS and imbalance. MRI showed a right thalamic/midbrain infiltrating lesion with obstructive hydrocephalus s/p stereotactic biopsy and ETV 2/10.   Assessment / Plan / Recommendation Clinical Impression  Pt has a flat affect, slowed processing, reduced thought organization/planning, and decreased sustained attention as assessed via the SLUMS. Although he is often able to follow even more complex instructions, he asked for frequent repetitions during testing. 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 He typically is independent and works at [employer], making me suspect further that he is experiencing cognitive changes, although his speech and language appear to be intact. Recommend SLP f/u acutely and with  likely need for SLP f/u at next level of care.    SLP Assessment  SLP Recommendation/Assessment: Patient needs continued Speech Lanaguage Pathology Services SLP Visit Diagnosis: Cognitive communication deficit (R41.841)    Follow Up Recommendations  (tba) SLP f/u at next level of care   Frequency and Duration min 2x/week  2 weeks      SLP Evaluation Cognition  Overall Cognitive Status: Impaired/Different from baseline Arousal/Alertness: Awake/alert Orientation Level: Oriented X4 Attention: Sustained Sustained Attention: Impaired Sustained Attention Impairment: Verbal basic Memory: Impaired Memory Impairment: Storage deficit Awareness: Impaired Awareness Impairment: Intellectual impairment Problem Solving: Impaired Problem Solving Impairment: Verbal basic Executive Function: Sequencing;Organizing Sequencing: Impaired Sequencing Impairment: Functional basic Organizing: Impaired Organizing Impairment: Functional basic Behaviors: Other (comment)(flat affect)       Comprehension  Auditory Comprehension Overall Auditory Comprehension: Impaired Commands: Within Functional Limits Conversation: Simple Other Conversation Comments: pt often asking for repetitions Interfering Components: Attention;Processing speed EffectiveTechniques: Repetition    Expression Expression Primary Mode of Expression: Verbal Verbal Expression Overall Verbal Expression: Appears within functional limits for tasks assessed   Oral / Motor  Motor Speech Overall Motor Speech: Appears within functional limits for tasks assessed   GO                     Osie Bond., M.A. [place] [facility] Pager [phone] Office [phone]  03/18/2019, 9:36 AM

## 2019-03-19 MED ORDER — DEXAMETHASONE 2 MG PO TABS: ORAL_TABLET | ORAL | 0 refills | Status: AC

## 2019-03-19 MED ORDER — HYDROCODONE-ACETAMINOPHEN 5-325 MG PO TABS: 1.0000 | ORAL_TABLET | ORAL | 0 refills | Status: DC | PRN

## 2019-03-19 MED ORDER — DOCUSATE SODIUM 100 MG PO CAPS: 100.0000 mg | ORAL_CAPSULE | Freq: Two times a day (BID) | ORAL | 0 refills | Status: DC

## 2019-03-19 MED ORDER — PANTOPRAZOLE SODIUM 40 MG PO TBEC: 40.0000 mg | DELAYED_RELEASE_TABLET | Freq: Every day | ORAL | Status: DC

## 2019-03-19 MED ORDER — LEVETIRACETAM 500 MG PO TABS: 500.0000 mg | ORAL_TABLET | Freq: Two times a day (BID) | ORAL | 0 refills | Status: DC

## 2019-03-19 MED ORDER — PANTOPRAZOLE SODIUM 40 MG PO TBEC: 40.0000 mg | DELAYED_RELEASE_TABLET | Freq: Every day | ORAL | 0 refills | Status: DC

## 2019-03-19 NOTE — Discharge Instructions (Signed)
Ok to shower, but avoid submerging incision Walk as much as possible No heavy lifting >10 lbs No driving for at least 2 weeks Continue outpatient PT, OT, Speech/cognitive therapy  Call office or go to ER if worsening severe headache or worsening neurologic deficit

## 2019-03-19 NOTE — Discharge Summary (Signed)
Physician Discharge Summary  Patient ID: Aaron Espinoza MRN: [id number] DOB/AGE: 46-Mar-1977 46 y.o.  Admit date: 03/17/2019 Discharge date: 03/19/2019  Admission Diagnoses:  Discharge Diagnoses:  Active Problems:   Brain tumor [facility])   Discharged Condition: fair  Hospital Course: This is a 44 year old man with likely neurofibromatosis type 1 who  Developed progressive confusion and discoordination and was found to have an infiltrating enhancing lesion in his right thalamus and midbrain concerning for glioma.  He was brought into the hospital  And underwent stereotactic biopsy of the lesion as well as endoscopic 3rd ventriculostomy for obstructive hydrocephalus.  Postoperatively, he was admitted to the intensive care unit.   Compared with preop, he had some improvement in his cognition and resolution of his headache.  A postoperative CT on postop day 1. Showed successful ventriculostomy and no were some findings at the biopsy site.  He underwent MRI CT and L-spine for spine screening which did not show any intramedullary lesions or synchronous CNS disease.  He underwent speech, PT, and OT evaluation and treatment and outpatient therapies were recommended.  He was placed on a steroid taper.  On postop day 2., he was deemed ready for discharge.  He was voiding, eating, and ambulating without difficulty.  Discharge instructions were given to the patient and wife.  He will follow up with Neurosurgery as well as with Dr. Mickeal Skinner of Neurooncology and Roma Kayser of Genetics.  Consults: PT, OT, SLP, neurooncology  Significant Diagnostic Studies:   See hospital course Treatments:  See hospital course  Discharge Exam: Blood pressure 130/71, pulse 60, temperature 98.1 F (36.7 C), resp. rate 14, height 6' (1.829 m), weight 81.1 kg, SpO2 97 %.  incisions clean dry and intact.  Awake, alert and oriented x3.  Following commands in all 4 extremities.  Mild left pronator drift.  Mild cognitive difficulties,  flat affect.  Disposition:   To home with outpatient therapies    Follow-up Information    Vallarie Mare, MD. Schedule an appointment as soon as possible for a visit in 10 day(s).   Specialty: Neurosurgery Contact information: [address] [place] [address] [phone]        Clarene Essex, Counselor. Schedule an appointment as soon as possible for a visit in 2 week(s).   Specialty: Quarry manager information: [place] [address] [phone]        Ventura Sellers, MD Follow up in 10 day(s).   Specialties: Psychiatry, Neurology, Oncology Contact information: [place] [address] [phone]           Signed: Vallarie Mare 03/19/2019, 9:57 AM

## 2019-03-19 NOTE — Progress Notes (Signed)
Physical Therapy Treatment Patient Details Name: Aaron Espinoza MRN: [id number] DOB: 05/29/1975 Today's Date: 03/19/2019    History of Present Illness Pt is a 44 yo male iwth likely neurofibromatosis who presented with increasing AMS and imbalance. MRI showed a right thalamic/midbrain infiltrating lesion with obstructive hydrocephalus s/p stereotactic biopsy and ETV 2/10.     PT Comments    Pt's balance still "off", but he can self-recover from LOB.  Pt should go to OPPT and/OR OT to get himself ready to get back to work   Follow Up Recommendations  Outpatient PT;Supervision/Assistance - 24 hour     Equipment Recommendations  None recommended by PT    Recommendations for Other Services       Precautions / Restrictions Precautions Precautions: (minor fall risk)    Mobility  Bed Mobility Overal bed mobility: Modified Independent                Transfers Overall transfer level: Modified independent                  Ambulation/Gait Ambulation/Gait assistance: Supervision;Independent Gait Distance (Feet): 500 Feet Assistive device: None Gait Pattern/deviations: Step-through pattern   Gait velocity interpretation: >4.37 ft/sec, indicative of normal walking speed General Gait Details: episodes of unsteadiness, more appropriate BOS, some left drift with scanning R., instability with backing up.   Stairs Stairs: Yes Stairs assistance: Modified independent (Device/Increase time);Independent Stair Management: No rails;Alternating pattern;Forwards Number of Stairs: 12 General stair comments: safe, but guarded without use of the rail.     Wheelchair Mobility    Modified Rankin (Stroke Patients Only)       Balance     Sitting balance-Leahy Scale: Good       Standing balance-Leahy Scale: Good                   Standardized Balance Assessment Standardized Balance Assessment : Berg Balance Test;Dynamic Gait Index Berg Balance Test Sit to  Stand: Able to stand without using hands and stabilize independently Standing Unsupported: Able to stand safely 2 minutes Sitting with Back Unsupported but Feet Supported on Floor or Stool: Able to sit safely and securely 2 minutes Stand to Sit: Sits safely with minimal use of hands Transfers: Able to transfer safely, minor use of hands Standing Unsupported with Eyes Closed: Able to stand 10 seconds with supervision From Standing, Reach Forward with Outstretched Arm: Can reach confidently >25 cm (10") From Standing Position, Pick up Object from Floor: Able to pick up shoe, needs supervision From Standing Position, Turn to Look Behind Over each Shoulder: Looks behind one side only/other side shows less weight shift Turn 360 Degrees: Able to turn 360 degrees safely one side only in 4 seconds or less Standing Unsupported, One Foot in Front: Able to plae foot ahead of the other independently and hold 30 seconds Standing on One Leg: Able to lift leg independently and hold 5-10 seconds Dynamic Gait Index Level Surface: Normal Change in Gait Speed: Normal Gait with Horizontal Head Turns: Mild Impairment Gait with Vertical Head Turns: Mild Impairment Gait and Pivot Turn: Normal Step Over Obstacle: Normal Step Around Obstacles: Mild Impairment Steps: Normal Total Score: 21      Cognition Arousal/Alertness: Awake/alert Behavior During Therapy: WFL for tasks assessed/performed Overall Cognitive Status: Within Functional Limits for tasks assessed(NT formally)  Exercises      General Comments General comments (skin integrity, edema, etc.): vss      Pertinent Vitals/Pain Pain Assessment: No/denies pain Pain Intervention(s): Monitored during session    Home Living                      Prior Function            PT Goals (current goals can now be found in the care plan section) Acute Rehab PT Goals PT Goal Formulation:  With patient Time For Goal Achievement: 03/25/19 Potential to Achieve Goals: Good Progress towards PT goals: Progressing toward goals    Frequency    Min 3X/week      PT Plan Current plan remains appropriate    Co-evaluation              AM-PAC PT "6 Clicks" Mobility   Outcome Measure  Help needed turning from your back to your side while in a flat bed without using bedrails?: None Help needed moving from lying on your back to sitting on the side of a flat bed without using bedrails?: None Help needed moving to and from a bed to a chair (including a wheelchair)?: None Help needed standing up from a chair using your arms (e.g., wheelchair or bedside chair)?: None Help needed to walk in hospital room?: None Help needed climbing 3-5 steps with a railing? : None 6 Click Score: 24    End of Session   Activity Tolerance: Patient tolerated treatment well Patient left: in chair;with call bell/phone within reach;with family/visitor present Nurse Communication: Mobility status PT Visit Diagnosis: Unsteadiness on feet (R26.81)     Time: 4035-2481 PT Time Calculation (min) (ACUTE ONLY): 22 min  Charges:  $Gait Training: 8-22 mins                     03/19/2019  Ginger Carne., PT Acute Rehabilitation Services [phone]  (pager) [phone]  (office)   Tessie Fass Aimee Timmons 03/19/2019, 1:03 PM

## 2019-03-19 NOTE — Progress Notes (Signed)
Occupational Therapy Treatment Patient Details Name: Aaron Espinoza MRN: [id number] DOB: [birth year] Today's Date: 03/19/2019    History of present illness Pt is a 44 yo male iwth likely neurofibromatosis who presented with increasing AMS and imbalance. MRI showed a right thalamic/midbrain infiltrating lesion with obstructive hydrocephalus s/p stereotactic biopsy and ETV 2/10.    OT comments  Pt seen for OT f/u with focus on d/c with wife present. Educated pt on at home strategies for LUE sensory, cognition, and Aaron Espinoza. Pt is asking about returning to work, educated on waiting to finish OP therapy and work on this with OP OT to ensure safe community integration. Messaged social work about work note for pt to remain out of work. D/c recs appropriate. Will continue to follow.   Follow Up Recommendations  Outpatient OT;Supervision/Assistance - 24 hour    Equipment Recommendations  None recommended by OT    Recommendations for Other Services      Precautions / Restrictions Precautions Precautions: Fall Precaution Comments: L sided sensory involvement       Mobility Bed Mobility Overal bed mobility: Modified Independent                Transfers Overall transfer level: Modified independent                    Balance Overall balance assessment: Needs assistance Sitting-balance support: No upper extremity supported Sitting balance-Leahy Scale: Good     Standing balance support: No upper extremity supported;During functional activity Standing balance-Leahy Scale: Good                   Standardized Balance Assessment Standardized Balance Assessment : Berg Balance Test;Dynamic Gait Index Berg Balance Test Sit to Stand: Able to stand without using hands and stabilize independently Standing Unsupported: Able to stand safely 2 minutes Sitting with Back Unsupported but Feet Supported on Floor or Stool: Able to sit safely and securely 2 minutes Stand to Sit: Sits  safely with minimal use of hands Transfers: Able to transfer safely, minor use of hands Standing Unsupported with Eyes Closed: Able to stand 10 seconds with supervision From Standing, Reach Forward with Outstretched Arm: Can reach confidently >25 cm (10") From Standing Position, Pick up Object from Floor: Able to pick up shoe, needs supervision From Standing Position, Turn to Look Behind Over each Shoulder: Looks behind one side only/other side shows less weight shift Turn 360 Degrees: Able to turn 360 degrees safely one side only in 4 seconds or less Standing Unsupported, One Foot in Front: Able to plae foot ahead of the other independently and hold 30 seconds Standing on One Leg: Able to lift leg independently and hold 5-10 seconds Dynamic Gait Index Level Surface: Normal Change in Gait Speed: Normal Gait with Horizontal Head Turns: Mild Impairment Gait with Vertical Head Turns: Mild Impairment Gait and Pivot Turn: Normal Step Over Obstacle: Normal Step Around Obstacles: Mild Impairment Steps: Normal Total Score: 21     ADL either performed or assessed with clinical judgement   ADL                                         General ADL Comments: pt is at supervision for BADL for safety     Vision Baseline Vision/History: Wears glasses Wears Glasses: At all times Additional Comments: remains having difficulty identifying in periphery and with L sided sensory  deficits   Perception     Praxis      Cognition Arousal/Alertness: Awake/alert Behavior During Therapy: WFL for tasks assessed/performed Overall Cognitive Status: Impaired/Different from baseline Area of Impairment: Attention;Awareness;Problem solving;Safety/judgement;Memory                   Current Attention Level: Sustained Memory: Decreased short-term memory   Safety/Judgement: Decreased awareness of deficits;Decreased awareness of safety Awareness: Emergent Problem Solving: Slow  processing;Decreased initiation;Difficulty sequencing;Requires verbal cues;Requires tactile cues General Comments: mostly continues to present with increased awareness to current condition, asking about returning to work soon        Exercises     Shoulder Instructions       General Comments vss    Pertinent Vitals/ Pain       Pain Assessment: No/denies pain Pain Intervention(s): Monitored during session  Home Living                                          Prior Functioning/Environment              Frequency  Min 2X/week        Progress Toward Goals  OT Goals(current goals can now be found in the care plan section)  Progress towards OT goals: Progressing toward goals  Acute Rehab OT Goals Patient Stated Goal: Independent and back to work OT Goal Formulation: With patient Time For Goal Achievement: 04/01/19 Potential to Achieve Goals: Good  Plan Discharge plan remains appropriate    Co-evaluation                 AM-PAC OT "6 Clicks" Daily Activity     Outcome Measure   Help from another person eating meals?: None Help from another person taking care of personal grooming?: None Help from another person toileting, which includes using toliet, bedpan, or urinal?: None Help from another person bathing (including washing, rinsing, drying)?: None Help from another person to put on and taking off regular upper body clothing?: None Help from another person to put on and taking off regular lower body clothing?: None 6 Click Score: 24    End of Session Equipment Utilized During Treatment: Gait belt  OT Visit Diagnosis: Unsteadiness on feet (R26.81);Other abnormalities of gait and mobility (R26.89);Muscle weakness (generalized) (M62.81);Other symptoms and signs involving cognitive function;Cognitive communication deficit (R41.841) Symptoms and signs involving cognitive functions: Other cerebrovascular disease(tumor)   Activity Tolerance  Patient tolerated treatment well   Patient Left in bed;Other (comment)(sitting on side of bed)   Nurse Communication Mobility status        Time: 3545-6256 OT Time Calculation (min): 10 min  Charges: OT General Charges $OT Visit: 1 Visit OT Treatments $Neuromuscular Re-education: 8-22 mins  Zenovia Jarred, MSOT, OTR/L Acute Rehabilitation Services [facility] Office Number: [phone]  Zenovia Jarred 03/19/2019, 1:11 PM

## 2019-03-19 NOTE — Progress Notes (Signed)
  Speech Language Pathology Treatment: Cognitive-Linquistic  Patient Details Name: Aaron Espinoza MRN: [id number] DOB: 1975-06-01 Today's Date: 03/19/2019 Time: 4235-3614 SLP Time Calculation (min) (ACUTE ONLY): 13 min  Assessment / Plan / Recommendation Clinical Impression  Pt was seen prior to discharge for education with his wife present as well. Pt remembered a few details of SLP evaluation on previous date. His wife was very receptive to education and asked about ways to engage him in therapeutic activities at home. Education was provided about safety related to awareness, attention, and memory. SLP also provided a few activities to try to work on stimulating his cognition, but encouraged pt to f/u with OP SLP for additional needs and ongoing safety. Per wife's request, we also repeated a delayed recall task for her to see how SLP provided cueing. Pt needed extra time and repetitions for immediate recall, but was then able to remember all four words later on (consistent with presentation on previous date). Continue to recommend OP SLP and 24/7 supervision.   HPI HPI: Pt is a 44 yo male iwth likely neurofibromatosis who presented with increasing AMS and imbalance. MRI showed a right thalamic/midbrain infiltrating lesion with obstructive hydrocephalus s/p stereotactic biopsy and ETV 2/10.      SLP Plan  Discharge SLP treatment due to (comment)(pt discharging home)       Recommendations                   Follow up Recommendations: Outpatient SLP;24 hour supervision/assistance SLP Visit Diagnosis: Cognitive communication deficit (E31.540) Plan: Discharge SLP treatment due to (comment)(pt discharging home)       GO                 Osie Bond., M.A. [place] [facility] Pager [phone] Office [phone]  03/19/2019, 2:02 PM

## 2019-03-19 NOTE — Progress Notes (Signed)
Subjective: Patient reports no headaches, minimal incisional pain  Objective: Vital signs in last 24 hours: Temp:  [98.1 F (36.7 C)-98.8 F (37.1 C)] 98.1 F (36.7 C) (02/12 0923) Pulse Rate:  [52-79] 60 (02/12 0900) Resp:  [11-25] 14 (02/12 0900) BP: (106-148)/(53-75) 130/71 (02/12 0900) SpO2:  [93 %-100 %] 97 % (02/12 0900) Arterial Line BP: (60)/(55) 60/55 (02/11 1000)  Intake/Output from previous day: 02/11 0701 - 02/12 0700 In: 1729.3 [P.O.:1315; I.V.:414.3] Out: 400 [Urine:400] Intake/Output this shift: No intake/output data recorded.  A+Ox3, PERRL, FC x 4, mild left sided drift  Lab Results: Recent Labs    03/17/19 1133 03/18/19 0616  WBC  --  14.9*  HGB 14.3 14.6  HCT 42.0 42.2  PLT  --  227   BMET Recent Labs    03/17/19 1133 03/18/19 0616  NA 139 139  K 4.3 4.2  CL  --  102  CO2  --  24  GLUCOSE  --  137*  BUN  --  9  CREATININE  --  0.77  CALCIUM  --  9.3    Studies/Results: CT HEAD WO CONTRAST  Result Date: 03/18/2019 CLINICAL DATA:  Status post drain/biopsy. EXAM: CT HEAD WITHOUT CONTRAST TECHNIQUE: Contiguous axial images were obtained from the base of the skull through the vertex without intravenous contrast. COMPARISON:  MRI from 2 days ago FINDINGS: Brain: Improved ventriculomegaly after third ventriculostomy. Indistinct mass centered at the right posterior thalamus with involvement of the upper cerebral aqua duct and lower third ventricle by MRI. There is history of neurofibromatosis and glioma is a leading consideration, although enhancement pattern and dense cellularity could also be from lymphoma. Periventricular low-density is likely from transependymal CSF flow and subjectively improved. No complicating infarct or hemorrhage. Vascular: Negative Skull: Unremarkable right frontal burr hole Sinuses/Orbits: Negative IMPRESSION: Improved ventriculomegaly after third ventriculostomy. No complicating hemorrhage or infarct. Electronically Signed    By: Monte Fantasia M.D.   On: 03/18/2019 08:08   MR CERVICAL SPINE W WO CONTRAST  Result Date: 03/18/2019 CLINICAL DATA:  44 year old male with neurofibromatosis. 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 EXAM: MRI CERVICAL SPINE WITHOUT AND WITH CONTRAST TECHNIQUE: Multiplanar and multiecho pulse sequences of the cervical spine, to include the craniocervical junction and cervicothoracic junction, were obtained without and with intravenous contrast. CONTRAST:  20m GADAVIST GADOBUTROL 1 MMOL/ML IV SOLN COMPARISON:  Brain MRI 03/16/2019 and earlier. FINDINGS: Alignment: Preserved cervical lordosis. Vertebrae: No marrow edema or evidence of acute osseous abnormality. Visualized bone marrow signal is within normal limits. Cord: Visible cervical and upper thoracic spinal cord signal and morphology are within normal limits. No abnormal intradural enhancement is identified. No dural thickening. Posterior Fossa, vertebral arteries, paraspinal tissues: Cervicomedullary junction is within normal limits. No definite posterior fossa leptomeningeal enhancement identified on these images. Preserved major vascular flow voids in the neck. The right vertebral artery appears mildly dominant. No cervical paraspinal or neck mass is identified. There is increased conspicuity of the bilateral exiting T1 nerves in the upper thoracic spine (series 5, images 35 and 36), which may be mildly hyperenhancing. Negative visible lung apices. Disc levels: Generally mild for age cervical spine degeneration. There is broad-based rightward disc bulge or protrusion at C6-C7 with some endplate spurring resulting in mild to moderate right C7 neural foraminal stenosis. No cervical spinal stenosis or other neural impingement identified. IMPRESSION: 1. No leptomeningeal disease identified in the cervical spine. Normal cervical spinal cord. 2. Questionable mild bilateral T1 exiting nerve neurofibromas. 3.  Mild for age cervical spine  degeneration, disc and endplate related mild to moderate right C7 neural foraminal stenosis. 4. Thoracic and lumbar MRI today reported separately. Electronically Signed   By: Genevie Ann M.D.   On: 03/18/2019 13:53   MR THORACIC SPINE W WO CONTRAST  Result Date: 03/18/2019 CLINICAL DATA:  44 year old male with neurofibromatosis. 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 EXAM: MRI THORACIC WITHOUT AND WITH CONTRAST TECHNIQUE: Multiplanar and multiecho pulse sequences of the thoracic spine were obtained without and with intravenous contrast. CONTRAST:  58m GADAVIST GADOBUTROL 1 MMOL/ML IV SOLN in conjunction with contrast enhanced imaging of the cervical spine reported separately. COMPARISON:  Cervical spine MRI today.  Brain MRI 03/16/2019 FINDINGS: Limited cervical spine imaging:  Reported separately today. Thoracic spine segmentation:  Appears normal. Alignment:  Preserved thoracic kyphosis.  No spondylolisthesis. Vertebrae: No marrow edema or evidence of acute osseous abnormality. Visualized bone marrow signal is within normal limits. Cord: Intermittent central thoracic spinal cord T2 hyperintensity from the T5-T6 level through T9-T10. The abnormality never measures more than 1-2 mm (series 26, image 21 at T7). There is no associated thoracic spinal cord expansion or cord edema. There is no associated intra-axial cord enhancement. No spinal cord leptomeningeal or other abnormal intradural enhancement is identified. No dural thickening. The conus medullaris remains normal at T12-L1. Paraspinal and other soft tissues: Trace layering pleural fluid with mild lower lung atelectasis suspected. Negative visible thoracic inlet. Otherwise negative visible thoracic and upper abdominal viscera. Increased conspicuity of the exiting T1 nerves again noted on series 28, image 4 as seen in the cervical spine. Other thoracic paraspinal soft tissues are within normal limits. No thoracic paraspinal mass identified.  Disc levels: Mild for age degeneration in the thoracic spine as seen in the cervical spine. There is mild upper thoracic facet hypertrophy. No thoracic spinal stenosis or convincing neural impingement. IMPRESSION: 1. Positive for intermittent small thoracic spinal cord syrinx. 2. But negative for cord expansion, cord edema, intra-axial or leptomeningeal metastatic disease in the thoracic spine. 3. Mild exiting T1 nerve prominence as seen in the cervical spine. Otherwise no thoracic paraspinal tumor. 4. Mild for age thoracic spine degeneration with no spinal stenosis or neural impingement. Electronically Signed   By: HGenevie AnnM.D.   On: 03/18/2019 13:59   MR Lumbar Spine W Wo Contrast  Result Date: 03/18/2019 CLINICAL DATA:  44year old male with neurofibromatosis. 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 EXAM: MRI LUMBAR SPINE WITHOUT AND WITH CONTRAST TECHNIQUE: Multiplanar and multiecho pulse sequences of the lumbar spine were obtained without and with intravenous contrast. CONTRAST:  890mGADAVIST GADOBUTROL 1 MMOL/ML IV SOLN in conjunction with contrast enhanced imaging of the cervical and thoracic spines reported separately. COMPARISON:  Thoracic spine MRI reported separately today. FINDINGS: Segmentation: Normal, concordant with the thoracic spine numbering today. Alignment: Mild straightening of lumbar lordosis. No spondylolisthesis. Vertebrae: No marrow edema or evidence of acute osseous abnormality. Visualized bone marrow signal is within normal limits. Intact visible sacrum and SI joints. Conus medullaris and cauda equina: Conus extends to the T12-L1 level. No lower spinal cord or conus signal abnormality. No abnormal intradural enhancement. Cauda equina nerve roots appear symmetric and normal. No dural thickening. Paraspinal and other soft tissues: Negative visible abdominal viscera. No lumbar paraspinal tumor identified. Disc levels: Mild for age lumbar spine degeneration. L4-L5 disc  desiccation with mild circumferential disc bulge. Broad-based mild posterior component of disc with annular fissure (series 1, image 8). Mild posterior element  hypertrophy. No spinal or convincing lateral recess stenosis. Up to mild bilateral L4 foraminal stenosis. IMPRESSION: 1. Normal conus medullaris and cauda equina, no leptomeningeal disease in the lumbosacral spine. 2. No lumbar paraspinal tumor identified. 3. Mild lumbar spine degeneration limited to L4-L5 with up to mild bilateral L4 foraminal stenosis. Electronically Signed   By: Genevie Ann M.D.   On: 03/18/2019 14:03    Assessment/Plan: S/p biopsy and ETV - d/c today   Vallarie Mare 03/19/2019, 9:43 AM

## 2019-03-23 ENCOUNTER — Encounter: Payer: BLUE CROSS/BLUE SHIELD | Admitting: Osteopathic Medicine

## 2019-03-23 DIAGNOSIS — R279 Unspecified lack of coordination: Secondary | ICD-10-CM | POA: Diagnosis not present

## 2019-03-23 DIAGNOSIS — M6281 Muscle weakness (generalized): Secondary | ICD-10-CM | POA: Diagnosis not present

## 2019-03-30 ENCOUNTER — Other Ambulatory Visit: Payer: Self-pay

## 2019-03-30 ENCOUNTER — Inpatient Hospital Stay: Payer: BC Managed Care – PPO | Attending: Internal Medicine | Admitting: Internal Medicine

## 2019-03-30 VITALS — BP 141/79 | HR 62 | Temp 98.9°F | Resp 18 | Ht 72.0 in | Wt 179.6 lb

## 2019-03-30 DIAGNOSIS — N400 Enlarged prostate without lower urinary tract symptoms: Secondary | ICD-10-CM | POA: Insufficient documentation

## 2019-03-30 DIAGNOSIS — G119 Hereditary ataxia, unspecified: Secondary | ICD-10-CM | POA: Insufficient documentation

## 2019-03-30 DIAGNOSIS — Z79899 Other long term (current) drug therapy: Secondary | ICD-10-CM | POA: Insufficient documentation

## 2019-03-30 DIAGNOSIS — M48061 Spinal stenosis, lumbar region without neurogenic claudication: Secondary | ICD-10-CM | POA: Diagnosis not present

## 2019-03-30 DIAGNOSIS — Q8500 Neurofibromatosis, unspecified: Secondary | ICD-10-CM | POA: Diagnosis not present

## 2019-03-30 DIAGNOSIS — C719 Malignant neoplasm of brain, unspecified: Secondary | ICD-10-CM | POA: Diagnosis not present

## 2019-03-30 DIAGNOSIS — G911 Obstructive hydrocephalus: Secondary | ICD-10-CM | POA: Insufficient documentation

## 2019-03-30 DIAGNOSIS — R600 Localized edema: Secondary | ICD-10-CM | POA: Insufficient documentation

## 2019-03-30 DIAGNOSIS — M4802 Spinal stenosis, cervical region: Secondary | ICD-10-CM | POA: Insufficient documentation

## 2019-03-30 DIAGNOSIS — Z87891 Personal history of nicotine dependence: Secondary | ICD-10-CM | POA: Diagnosis not present

## 2019-03-30 NOTE — Progress Notes (Signed)
error 

## 2019-03-30 NOTE — Progress Notes (Signed)
[facility] [place], [place] [address] [phone]   New Patient Evaluation  Date of Service: 03/30/19 Patient Name: Aaron Espinoza Patient MRN: [id number] Patient DOB: [birth year] Provider: Ventura Sellers, MD  Identifying Statement:  Aaron Espinoza is a 44 y.o. male with right thalamic anaplastic astrocytoma who presents for initial consultation and evaluation.    Referring Provider: No referring provider defined for this encounter.  Oncologic History: Oncology History  Anaplastic astrocytoma ([place])  03/17/2019 Surgery   Stereotactic biopsy by Dr. Marcello Moores; path demonstrates anaplastic astrocytoma     Biomarkers:  MGMT Unknown.  IDH 1/2 Unknown.  EGFR Unknown  TERT Unknown   History of Present Illness: The patient's records from the referring physician were obtained and reviewed and the patient interviewed to confirm this HPI.  Aaron Espinoza presented to medical attention in late January, when he noticed mild confusion, gait imbalance.  These symptoms worsened over the next ~10 days, and were accompanied by incontinence.  Brain MRI was performed and demonstrated a right thalamic mass consistent with likely primary brain tumor.  He underwent stereotactic biopsy and ventriculostomy on 03/17/19 with Dr. Marcello Moores.  Following surgery he describes considerable improvement in most of his symptoms; at this time he is near baseline function but "very fatigued" overall.  No seizures or headaches since surgery.  Medications: Current Outpatient Medications on File Prior to Visit  Medication Sig Dispense Refill  . acetaminophen (TYLENOL) 500 MG tablet Take 1,000 mg by mouth every 6 (six) hours as needed.    . bisacodyl (BISACODYL) 5 MG EC tablet Take 5 mg by mouth daily as needed for moderate constipation.    Marland Kitchen dexamethasone (DECADRON) 2 MG tablet Take 3 tablets (6 mg total) by mouth every 6 (six) hours for 2 days, THEN 2 tablets (4  mg total) every 6 (six) hours for 3 days, THEN 2 tablets (4 mg total) 2 (two) times daily for 3 days, THEN 1 tablet (2 mg total) 2 (two) times daily for 3 days, THEN 0.5 tablets (1 mg total) 2 (two) times daily for 10 days. ta. 76 tablet 0  . docusate sodium (COLACE) 100 MG capsule Take 1 capsule (100 mg total) by mouth 2 (two) times daily. 10 capsule 0  . pantoprazole (PROTONIX) 40 MG tablet Take 1 tablet (40 mg total) by mouth daily. Take while on dexamethasone. Stop when dexamethasone taper is complete. 21 tablet 0  . levETIRAcetam (KEPPRA) 500 MG tablet Take 1 tablet (500 mg total) by mouth 2 (two) times daily. (Patient not taking: Reported on 03/30/2019) 14 tablet 0   No current facility-administered medications on file prior to visit.    Allergies: No Known Allergies Past Medical History:  Past Medical History:  Diagnosis Date  . BPH (benign prostatic hyperplasia) 07/20/2015  . Brain mass   . Cognitive dysfunction 01/2019  . Fatigue 01/2019  . Headache   . NF (neurofibromatosis) ([place]) 07/20/2015   NF1   Past Surgical History:  Past Surgical History:  Procedure Laterality Date  . HAND SURGERY Left    pinky - tumor benign  . LEG SURGERY Left    inner thigh tumor - benign  . VENTRICULOSTOMY N/A 03/17/2019   Procedure: ENDOSCOPIC THIRD VENTRICULOSTOMY;  Surgeon: Vallarie Mare, MD;  Location: [facility];  Service: Neurosurgery;  Laterality: N/A;  ENDOSCOPIC THIRD VENTRICULOSTOMY   Social History:  Social History   Socioeconomic History  . Marital status: Married  Spouse name: Not on file  . Number of children: Not on file  . Years of education: Not on file  . Highest education level: Not on file  Occupational History  . Not on file  Tobacco Use  . Smoking status: Former Smoker    Types: Cigars, Cigarettes  . Smokeless tobacco: Never Used  . Tobacco comment: Quit smoking cigarettes 25 yrs ago, quit cigars in 2020  Substance and Sexual Activity  . Alcohol use: Yes     Alcohol/week: 6.0 - 9.0 standard drinks    Types: 6 - 9 Standard drinks or equivalent per week  . Drug use: No  . Sexual activity: Not on file  Other Topics Concern  . Not on file  Social History Narrative  . Not on file   Social Determinants of Health   Financial Resource Strain:   . Difficulty of Paying Living Expenses: Not on file  Food Insecurity:   . Worried About Charity fundraiser in the Last Year: Not on file  . Ran Out of Food in the Last Year: Not on file  Transportation Needs:   . Lack of Transportation (Medical): Not on file  . Lack of Transportation (Non-Medical): Not on file  Physical Activity:   . Days of Exercise per Week: Not on file  . Minutes of Exercise per Session: Not on file  Stress:   . Feeling of Stress : Not on file  Social Connections:   . Frequency of Communication with Friends and Family: Not on file  . Frequency of Social Gatherings with Friends and Family: Not on file  . Attends Religious Services: Not on file  . Active Member of Clubs or Organizations: Not on file  . Attends Archivist Meetings: Not on file  . Marital Status: Not on file  Intimate Partner Violence:   . Fear of Current or Ex-Partner: Not on file  . Emotionally Abused: Not on file  . Physically Abused: Not on file  . Sexually Abused: Not on file   Family History:  Family History  Problem Relation Age of Onset  . Cancer Paternal Grandfather     Review of Systems: Constitutional: Doesn't report fevers, chills or abnormal weight loss Eyes: Doesn't report blurriness of vision Ears, nose, mouth, throat, and face: Doesn't report sore throat Respiratory: Doesn't report cough, dyspnea or wheezes Cardiovascular: Doesn't report palpitation, chest discomfort  Gastrointestinal:  Doesn't report nausea, constipation, diarrhea GU: Doesn't report incontinence Skin: Doesn't report skin rashes Neurological: Per HPI Musculoskeletal: Doesn't report joint  pain Behavioral/Psych: Doesn't report anxiety  Physical Exam: Vitals:   03/30/19 0911  BP: (!) 141/79  Pulse: 62  Resp: 18  Temp: 98.9 F (37.2 C)  SpO2: 100%   KPS: 90. General: Alert, cooperative, pleasant, in no acute distress Head: Normal EENT: No conjunctival injection or scleral icterus.  Lungs: Resp effort normal Cardiac: Regular rate Abdomen: Non-distended abdomen Skin: No rashes cyanosis or petechiae. Extremities: No clubbing or edema  Neurologic Exam: Mental Status: Awake, alert, attentive to examiner. Oriented to self and environment. Language is fluent with intact comprehension.  Cranial Nerves: Visual acuity is grossly normal. Visual fields are full. Extra-ocular movements intact. No ptosis. Face is symmetric Motor: Tone and bulk are normal. Power is full in both arms and legs. Reflexes are symmetric, no pathologic reflexes present.  Sensory: Intact to light touch Gait: Normal.   Labs: I have reviewed the data as listed    Component Value Date/Time   NA  139 03/18/2019 0616   K 4.2 03/18/2019 0616   CL 102 03/18/2019 0616   CO2 24 03/18/2019 0616   GLUCOSE 137 (H) 03/18/2019 0616   BUN 9 03/18/2019 0616   CREATININE 0.77 03/18/2019 0616   CREATININE 0.83 03/04/2019 1206   CALCIUM 9.3 03/18/2019 0616   PROT 7.2 03/04/2019 1206   ALBUMIN 4.4 06/26/2016 1009   AST 12 03/04/2019 1206   ALT 15 03/04/2019 1206   ALKPHOS 65 06/26/2016 1009   BILITOT 0.9 03/04/2019 1206   GFRNONAA >60 03/18/2019 0616   GFRNONAA 106 03/04/2019 1206   GFRAA >60 03/18/2019 0616   GFRAA 123 03/04/2019 1206   Lab Results  Component Value Date   WBC 14.9 (H) 03/18/2019   NEUTROABS 13.4 (H) 03/18/2019   HGB 14.6 03/18/2019   HCT 42.2 03/18/2019   MCV 86.5 03/18/2019   PLT 227 03/18/2019    Imaging:  CT HEAD WO CONTRAST  Result Date: 03/18/2019 CLINICAL DATA:  Status post drain/biopsy. EXAM: CT HEAD WITHOUT CONTRAST TECHNIQUE: Contiguous axial images were obtained from  the base of the skull through the vertex without intravenous contrast. COMPARISON:  MRI from 2 days ago FINDINGS: Brain: Improved ventriculomegaly after third ventriculostomy. Indistinct mass centered at the right posterior thalamus with involvement of the upper cerebral aqua duct and lower third ventricle by MRI. There is history of neurofibromatosis and glioma is a leading consideration, although enhancement pattern and dense cellularity could also be from lymphoma. Periventricular low-density is likely from transependymal CSF flow and subjectively improved. No complicating infarct or hemorrhage. Vascular: Negative Skull: Unremarkable right frontal burr hole Sinuses/Orbits: Negative IMPRESSION: Improved ventriculomegaly after third ventriculostomy. No complicating hemorrhage or infarct. Electronically Signed   By: Monte Fantasia M.D.   On: 03/18/2019 08:08   MR BRAIN W WO CONTRAST  Result Date: 03/17/2019 CLINICAL DATA:  Brain mass. Stereotactic protocol. EXAM: MRI HEAD WITHOUT AND WITH CONTRAST TECHNIQUE: Multiplanar, multiecho pulse sequences of the brain and surrounding structures were obtained without and with intravenous contrast. CONTRAST:  7.29m GADAVIST GADOBUTROL 1 MMOL/ML IV SOLN COMPARISON:  MRI of the brain March 08, 2019 FINDINGS: Brain: Again seen is a mass lesion centered on the right thalamus with ill defined margins extending inferiorly into the midbrain involving the cerebral aqueduct and posterior aspect of the third ventricle bilaterally, with mass effect resulting in obstructive hydrocephalus. There is also increased T2 signal in the fornices and hippocampi. The lesion shows patchy peripheral contrast enhancement which extends to the walls of the third ventricle and meeting the superior aspect of the cerebral aqueduct, suggesting subependymal spread. No restricted diffusion noted. The lesion measures approximately 4.6 by 3.7 by 3.2 cm (AP, T, cc), unchanged from prior. Periventricular  T2 hyperintensity likely representing transependymal edema is unchanged. Vascular: Normal flow voids. Skull and upper cervical spine: Normal marrow signal. Sinuses/Orbits: Negative. Other: None. IMPRESSION: 1. Unchanged appearance of mass centered on the right thalamus with extension into the midbrain involving the cerebral aqueduct and posterior aspect of the third ventricle with evidence of subependymal spread. 2. Unchanged obstructive hydrocephalus with periventricular T2 hyperintensity likely representing transependymal edema. Electronically Signed   By: KPedro EarlsM.D.   On: 03/17/2019 09:39   MR Brain W Wo Contrast  Addendum Date: 03/08/2019   ADDENDUM REPORT: 03/08/2019 12:13 ADDENDUM: These results were called by telephone at the time of interpretation on 03/08/2019 at 12:13 pm to [facility], who verbally acknowledged these results. Electronically Signed   By:  Franchot Gallo M.D.   On: 03/08/2019 12:13   Result Date: 03/08/2019 CLINICAL DATA:  Impairment of cognitive function. Cerebellar ataxia. Neurofibromatosis. EXAM: MRI HEAD WITHOUT AND WITH CONTRAST TECHNIQUE: Multiplanar, multiecho pulse sequences of the brain and surrounding structures were obtained without and with intravenous contrast. CONTRAST:  8 mL Gadovist IV COMPARISON:  None. FINDINGS: Brain: Obstructive hydrocephalus. Dilatation of lateral and third ventricles due to mass lesion in the right thalamus and midbrain. Ventricles are rounded and dilated with periventricular resorption of CSF in the white matter bilaterally. Enhancing mass lesion involving the right thalamus extending into the midbrain on the right. There is displacement and compression of the aqueduct causing hydrocephalus. The mass shows patchy peripheral enhancement and measures approximately 3.7 x 2.7 x 2.9 cm. No associated hemorrhage. The mass shows facilitated diffusion. No other mass lesion identified. No acute infarct Vascular: Normal  arterial flow voids Skull and upper cervical spine: No focal skeletal lesion. Sphenoid bone appears normal and symmetric bilaterally. Sinuses/Orbits: Mild mucosal edema paranasal sinuses. Negative orbit. Other: None IMPRESSION: Obstructive hydrocephalus due to a mass lesion in the right thalamus and right midbrain. Mass shows patchy peripheral enhancement. Given the history of neurofibromatosis, this is likely a glioma. Electronically Signed: By: Franchot Gallo M.D. On: 03/08/2019 12:03   MR CERVICAL SPINE W WO CONTRAST  Result Date: 03/18/2019 CLINICAL DATA:  44 year old male with neurofibromatosis. 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 EXAM: MRI CERVICAL SPINE WITHOUT AND WITH CONTRAST TECHNIQUE: Multiplanar and multiecho pulse sequences of the cervical spine, to include the craniocervical junction and cervicothoracic junction, were obtained without and with intravenous contrast. CONTRAST:  32m GADAVIST GADOBUTROL 1 MMOL/ML IV SOLN COMPARISON:  Brain MRI 03/16/2019 and earlier. FINDINGS: Alignment: Preserved cervical lordosis. Vertebrae: No marrow edema or evidence of acute osseous abnormality. Visualized bone marrow signal is within normal limits. Cord: Visible cervical and upper thoracic spinal cord signal and morphology are within normal limits. No abnormal intradural enhancement is identified. No dural thickening. Posterior Fossa, vertebral arteries, paraspinal tissues: Cervicomedullary junction is within normal limits. No definite posterior fossa leptomeningeal enhancement identified on these images. Preserved major vascular flow voids in the neck. The right vertebral artery appears mildly dominant. No cervical paraspinal or neck mass is identified. There is increased conspicuity of the bilateral exiting T1 nerves in the upper thoracic spine (series 5, images 35 and 36), which may be mildly hyperenhancing. Negative visible lung apices. Disc levels: Generally mild for age cervical spine  degeneration. There is broad-based rightward disc bulge or protrusion at C6-C7 with some endplate spurring resulting in mild to moderate right C7 neural foraminal stenosis. No cervical spinal stenosis or other neural impingement identified. IMPRESSION: 1. No leptomeningeal disease identified in the cervical spine. Normal cervical spinal cord. 2. Questionable mild bilateral T1 exiting nerve neurofibromas. 3. Mild for age cervical spine degeneration, disc and endplate related mild to moderate right C7 neural foraminal stenosis. 4. Thoracic and lumbar MRI today reported separately. Electronically Signed   By: HGenevie AnnM.D.   On: 03/18/2019 13:53   MR THORACIC SPINE W WO CONTRAST  Result Date: 03/18/2019 CLINICAL DATA:  44year old male with neurofibromatosis. 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 EXAM: MRI THORACIC WITHOUT AND WITH CONTRAST TECHNIQUE: Multiplanar and multiecho pulse sequences of the thoracic spine were obtained without and with intravenous contrast. CONTRAST:  847mGADAVIST GADOBUTROL 1 MMOL/ML IV SOLN in conjunction with contrast enhanced imaging of the cervical spine reported separately. COMPARISON:  Cervical spine MRI today.  Brain MRI 03/16/2019 FINDINGS: Limited cervical spine imaging:  Reported separately today. Thoracic spine segmentation:  Appears normal. Alignment:  Preserved thoracic kyphosis.  No spondylolisthesis. Vertebrae: No marrow edema or evidence of acute osseous abnormality. Visualized bone marrow signal is within normal limits. Cord: Intermittent central thoracic spinal cord T2 hyperintensity from the T5-T6 level through T9-T10. The abnormality never measures more than 1-2 mm (series 26, image 21 at T7). There is no associated thoracic spinal cord expansion or cord edema. There is no associated intra-axial cord enhancement. No spinal cord leptomeningeal or other abnormal intradural enhancement is identified. No dural thickening. The conus medullaris remains  normal at T12-L1. Paraspinal and other soft tissues: Trace layering pleural fluid with mild lower lung atelectasis suspected. Negative visible thoracic inlet. Otherwise negative visible thoracic and upper abdominal viscera. Increased conspicuity of the exiting T1 nerves again noted on series 28, image 4 as seen in the cervical spine. Other thoracic paraspinal soft tissues are within normal limits. No thoracic paraspinal mass identified. Disc levels: Mild for age degeneration in the thoracic spine as seen in the cervical spine. There is mild upper thoracic facet hypertrophy. No thoracic spinal stenosis or convincing neural impingement. IMPRESSION: 1. Positive for intermittent small thoracic spinal cord syrinx. 2. But negative for cord expansion, cord edema, intra-axial or leptomeningeal metastatic disease in the thoracic spine. 3. Mild exiting T1 nerve prominence as seen in the cervical spine. Otherwise no thoracic paraspinal tumor. 4. Mild for age thoracic spine degeneration with no spinal stenosis or neural impingement. Electronically Signed   By: Genevie Ann M.D.   On: 03/18/2019 13:59   MR Lumbar Spine W Wo Contrast  Result Date: 03/18/2019 CLINICAL DATA:  44 year old male with neurofibromatosis. 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 EXAM: MRI LUMBAR SPINE WITHOUT AND WITH CONTRAST TECHNIQUE: Multiplanar and multiecho pulse sequences of the lumbar spine were obtained without and with intravenous contrast. CONTRAST:  84m GADAVIST GADOBUTROL 1 MMOL/ML IV SOLN in conjunction with contrast enhanced imaging of the cervical and thoracic spines reported separately. COMPARISON:  Thoracic spine MRI reported separately today. FINDINGS: Segmentation: Normal, concordant with the thoracic spine numbering today. Alignment: Mild straightening of lumbar lordosis. No spondylolisthesis. Vertebrae: No marrow edema or evidence of acute osseous abnormality. Visualized bone marrow signal is within normal limits.  Intact visible sacrum and SI joints. Conus medullaris and cauda equina: Conus extends to the T12-L1 level. No lower spinal cord or conus signal abnormality. No abnormal intradural enhancement. Cauda equina nerve roots appear symmetric and normal. No dural thickening. Paraspinal and other soft tissues: Negative visible abdominal viscera. No lumbar paraspinal tumor identified. Disc levels: Mild for age lumbar spine degeneration. L4-L5 disc desiccation with mild circumferential disc bulge. Broad-based mild posterior component of disc with annular fissure (series 1, image 8). Mild posterior element hypertrophy. No spinal or convincing lateral recess stenosis. Up to mild bilateral L4 foraminal stenosis. IMPRESSION: 1. Normal conus medullaris and cauda equina, no leptomeningeal disease in the lumbosacral spine. 2. No lumbar paraspinal tumor identified. 3. Mild lumbar spine degeneration limited to L4-L5 with up to mild bilateral L4 foraminal stenosis. Electronically Signed   By: HGenevie AnnM.D.   On: 03/18/2019 14:03    Pathology: SURGICAL PATHOLOGY  CASE: [id number]  PATIENT: JHuey Bienenstock Surgical Pathology Report   Clinical History: brain mass (cm)    FINAL MICROSCOPIC DIAGNOSIS:   A. BRAIN, RIGHT THALAMIC LESION, BIOPSY:  - Scant hypercellular glial tissue   B. BRAIN, SUPERFICIAL LESION, BIOPSY:  -  Anaplastic astrocytoma, WHO Grade 3  - See comment   C. BRAIN, DEEP LESION, BIOPSY:  - Anaplastic astrocytoma, WHO Grade 3  - See comment   COMMENT:   There is no necrosis and no significant vascular proliferation. Dr.  Saralyn Pilar reviewed the case and agrees with the above diagnosis. IDH1/2  is pending and will be reported in an addendum.   Assessment/Plan Anaplastic astrocytoma [facility]) [C71.9]  Mr. Staszak presents with clinical and radiographic syndrome consistent with diencephalic anaplastic astrocytoma and accompanying obstructive hydrocephalus.  He is clinically improved since  undergoing decompression via ventriculostomy.    Today we extensively discussed and reviewed his pathology, including histologic and imaging features, prognosis, and treatment pathways.    At this time our recommendation is to proceed with intensity modulated radiation therapy with concurrent daily temozolomide, given over 6 weeks.  We counseled him on side effects of Temodar including nausea/vomiting, fatigue, cytopenias, constipation.   Chemotherapy should be held for the following:  ANC less than 1,000  Platelets less than 100,000  LFT or creatinine greater than 2x ULN  If clinical concerns/contraindications develop  Next step will be consultation with radiation oncology and CT-simulation.    Decadron taper will continue as scheduled.  No need to restart AED therapy unless seizures occur.  Counseled on seizure precautions.  We appreciate the opportunity to participate in the care of KONG PACKETT.  He will follow up with [place] during weeks 2, 4 and 6 of radiation with labs for evaluation.  Screening for potential clinical trials was performed and discussed using eligibility criteria for active protocols at [facility], loco-regional tertiary centers, as well as national database available on [email or web address].    The patient is not a candidate for a research protocol at this time due to no suitable study identified.   We spent twenty additional minutes teaching regarding the natural history, biology, and historical experience in the treatment of brain tumors. We then discussed in detail the current recommendations for therapy focusing on the mode of administration, mechanism of action, anticipated toxicities, and quality of life issues associated with this plan. We also provided teaching sheets for the patient to take home as an additional resource.  All questions were answered. The patient knows to call the clinic with any problems, questions or concerns. No barriers to learning were  detected.  The total time spent in the encounter was 60 minutes and more than 50% was on counseling and review of test results   Ventura Sellers, MD Medical Director of Neuro-Oncology [facility] [place] 03/30/19 3:31 PM

## 2019-03-31 ENCOUNTER — Ambulatory Visit: Admission: RE | Admit: 2019-03-31 | Payer: BC Managed Care – PPO | Source: Ambulatory Visit

## 2019-03-31 ENCOUNTER — Inpatient Hospital Stay: Payer: BC Managed Care – PPO

## 2019-03-31 ENCOUNTER — Inpatient Hospital Stay
Admission: RE | Admit: 2019-03-31 | Discharge: 2019-03-31 | Disposition: A | Discharge status: Home / Self Care | Payer: BC Managed Care – PPO | Source: Ambulatory Visit | Attending: Radiation Oncology | Admitting: Radiation Oncology

## 2019-03-31 ENCOUNTER — Ambulatory Visit
Admission: RE | Admit: 2019-03-31 | Discharge: 2019-03-31 | Disposition: A | Discharge status: Home / Self Care | Payer: BC Managed Care – PPO | Source: Ambulatory Visit | Attending: Radiation Oncology | Admitting: Radiation Oncology

## 2019-03-31 ENCOUNTER — Encounter: Payer: Self-pay | Admitting: Radiation Oncology

## 2019-03-31 VITALS — Ht 72.0 in | Wt 179.0 lb

## 2019-03-31 DIAGNOSIS — C710 Malignant neoplasm of cerebrum, except lobes and ventricles: Secondary | ICD-10-CM | POA: Diagnosis not present

## 2019-03-31 DIAGNOSIS — M6281 Muscle weakness (generalized): Secondary | ICD-10-CM | POA: Diagnosis not present

## 2019-03-31 DIAGNOSIS — R279 Unspecified lack of coordination: Secondary | ICD-10-CM | POA: Diagnosis not present

## 2019-03-31 NOTE — Progress Notes (Signed)
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   HISTORY OF PRESENT ILLNESS: Aaron Espinoza is a 44 y.o. male seen at the request of Dr. Mickeal Skinner for a newly diagnosed anaplastic astrocytoma.  The patient had experienced confusion and some mild gait abnormalities including imbalance.  His symptoms worsened over about a week and a half and he had an episode of incontinence.  An MRI was performed of the brain on 03/08/2019 revealing a 3.7 x 2.7 x 2.9 cm mass with patchy peripheral enhancement in the right thalamus extending into the midbrain on the right.  No associated hemorrhage or other mass was identified.  There were changes of dilation of the lateral and third ventricles with periventricular resorption of CSF in the right matter bilaterally.  He proceeded with neurosurgery evaluation and a repeat MRI of the brain on 03/16/2019 which again demonstrated this lesion measuring 4.6 x 3.7 x 3.2 cm.  On 03/17/2019 was taken to the operating room for stereotactic biopsy and ventriculostomy with Dr. Marcello Moores.  Following his surgical procedure he did undergo MRI of the entire spine which showed intermittent small thoracic spinal cord syrinx but negative for cord expansion edema or concern for leptomeningeal disease.  Mild exiting T1 nerve prominence was also seen in the cervical spine.  He has met with Dr. Mickeal Skinner following his pathology resulting in showing a grade 3 anaplastic astrocytoma.  Additional biomarkers have been ordered but are pending. He's contacted via videochat to discuss treatment recommendations.    PREVIOUS RADIATION THERAPY: No   PAST MEDICAL HISTORY:  Past Medical History:  Diagnosis Date  . BPH (benign  prostatic hyperplasia) 07/20/2015  . Brain mass   . Cognitive dysfunction 01/2019  . Fatigue 01/2019  . Headache   . NF (neurofibromatosis) ([place]) 07/20/2015   NF1       PAST SURGICAL HISTORY: Past Surgical History:  Procedure Laterality Date  . HAND SURGERY Left    pinky - tumor benign  . LEG SURGERY Left    inner thigh tumor - benign  . VENTRICULOSTOMY N/A 03/17/2019   Procedure: ENDOSCOPIC THIRD VENTRICULOSTOMY;  Surgeon: Vallarie Mare, MD;  Location: [facility];  Service: Neurosurgery;  Laterality: N/A;  ENDOSCOPIC THIRD VENTRICULOSTOMY     FAMILY HISTORY:  Family History  Problem Relation Age of Onset  . Cancer Paternal Grandfather        Prostate     SOCIAL HISTORY:  reports that he has quit smoking. His smoking use included cigars and cigarettes. He has never used smokeless tobacco. He reports current alcohol use of about 6.0 - 9.0 standard drinks of alcohol per week. He reports that he does not use drugs.The patient is married and lives in [place]. He is not working currently but had been working at [employer] in [place].    ALLERGIES: Patient has no known allergies.   MEDICATIONS:  Current Outpatient Medications  Medication Sig Dispense Refill  . acetaminophen (TYLENOL) 500 MG tablet Take 1,000 mg by mouth every 6 (six) hours as needed.    . bisacodyl (BISACODYL) 5 MG EC tablet Take 5 mg by mouth daily as needed for moderate constipation.    Marland Kitchen  dexamethasone (DECADRON) 2 MG tablet Take 3 tablets (6 mg total) by mouth every 6 (six) hours for 2 days, THEN 2 tablets (4 mg total) every 6 (six) hours for 3 days, THEN 2 tablets (4 mg total) 2 (two) times daily for 3 days, THEN 1 tablet (2 mg total) 2 (two) times daily for 3 days, THEN 0.5 tablets (1 mg total) 2 (two) times daily for 10 days. ta. 76 tablet 0  . docusate sodium (COLACE) 100 MG capsule Take 1 capsule (100 mg total) by mouth 2 (two) times daily. 10 capsule 0  . pantoprazole (PROTONIX) 40 MG tablet  Take 1 tablet (40 mg total) by mouth daily. Take while on dexamethasone. Stop when dexamethasone taper is complete. 21 tablet 0   No current facility-administered medications for this encounter.     REVIEW OF SYSTEMS: On review of systems, the patient reports that he is doing well overall. He does report significant improvement of his symptoms since his surgery. He had been having a leg drop and felt like he had to drag his leg. He does have some mild numbness in his feet currently. He also reports that his headaches prior to surgery have significantly improved, he does have a mild headache however, and reports he is no longer having difficulties with incontinence. He is no longer taking Keppra and is currently down to 1 mg of Dexamethasone twice daily. He denies any chest pain, shortness of breath, cough, fevers, chills, night sweats, unintended weight changes. He denies any bowel or bladder disturbances, and denies abdominal pain, nausea or vomiting. He denies any new musculoskeletal or joint aches or pains. A complete review of systems is obtained and is otherwise negative.     PHYSICAL EXAM:  Pain Assessment Pain Score: 3  Pain Loc: Head/10  In general this is a well appearing caucasian male in no acute distress. He has a baseline facial tic that is noted and he has had this for years that has been attributed to his neurofibromatosis that he's had a diagnosis of since a child. He's alert and oriented x4 and appropriate throughout the examination. Cardiopulmonary assessment is negative for acute distress and he exhibits normal effort.    ECOG = 1  0 - Asymptomatic (Fully active, able to carry on all predisease activities without restriction)  1 - Symptomatic but completely ambulatory (Restricted in physically strenuous activity but ambulatory and able to carry out work of a light or sedentary nature. For example, light housework, office work)  2 - Symptomatic, <50% in bed during the day  (Ambulatory and capable of all self care but unable to carry out any work activities. Up and about more than 50% of waking hours)  3 - Symptomatic, >50% in bed, but not bedbound (Capable of only limited self-care, confined to bed or chair 50% or more of waking hours)  4 - Bedbound (Completely disabled. Cannot carry on any self-care. Totally confined to bed or chair)  5 - Death   Eustace Pen MM, Creech RH, Tormey DC, et al. [phone]). "Toxicity and response criteria of the [facility]". [place] Oncol. 5 (6): 649-55    LABORATORY DATA:  Lab Results  Component Value Date   WBC 14.9 (H) 03/18/2019   HGB 14.6 03/18/2019   HCT 42.2 03/18/2019   MCV 86.5 03/18/2019   PLT 227 03/18/2019   Lab Results  Component Value Date   NA 139 03/18/2019   K 4.2 03/18/2019   CL 102 03/18/2019  CO2 24 03/18/2019   Lab Results  Component Value Date   ALT 15 03/04/2019   AST 12 03/04/2019   ALKPHOS 65 06/26/2016   BILITOT 0.9 03/04/2019      RADIOGRAPHY: CT HEAD WO CONTRAST  Result Date: 03/18/2019 CLINICAL DATA:  Status post drain/biopsy. EXAM: CT HEAD WITHOUT CONTRAST TECHNIQUE: Contiguous axial images were obtained from the base of the skull through the vertex without intravenous contrast. COMPARISON:  MRI from 2 days ago FINDINGS: Brain: Improved ventriculomegaly after third ventriculostomy. Indistinct mass centered at the right posterior thalamus with involvement of the upper cerebral aqua duct and lower third ventricle by MRI. There is history of neurofibromatosis and glioma is a leading consideration, although enhancement pattern and dense cellularity could also be from lymphoma. Periventricular low-density is likely from transependymal CSF flow and subjectively improved. No complicating infarct or hemorrhage. Vascular: Negative Skull: Unremarkable right frontal burr hole Sinuses/Orbits: Negative IMPRESSION: Improved ventriculomegaly after third ventriculostomy. No  complicating hemorrhage or infarct. Electronically Signed   By: Monte Fantasia M.D.   On: 03/18/2019 08:08   MR BRAIN W WO CONTRAST  Result Date: 03/17/2019 CLINICAL DATA:  Brain mass. Stereotactic protocol. EXAM: MRI HEAD WITHOUT AND WITH CONTRAST TECHNIQUE: Multiplanar, multiecho pulse sequences of the brain and surrounding structures were obtained without and with intravenous contrast. CONTRAST:  7.61m GADAVIST GADOBUTROL 1 MMOL/ML IV SOLN COMPARISON:  MRI of the brain March 08, 2019 FINDINGS: Brain: Again seen is a mass lesion centered on the right thalamus with ill defined margins extending inferiorly into the midbrain involving the cerebral aqueduct and posterior aspect of the third ventricle bilaterally, with mass effect resulting in obstructive hydrocephalus. There is also increased T2 signal in the fornices and hippocampi. The lesion shows patchy peripheral contrast enhancement which extends to the walls of the third ventricle and meeting the superior aspect of the cerebral aqueduct, suggesting subependymal spread. No restricted diffusion noted. The lesion measures approximately 4.6 by 3.7 by 3.2 cm (AP, T, cc), unchanged from prior. Periventricular T2 hyperintensity likely representing transependymal edema is unchanged. Vascular: Normal flow voids. Skull and upper cervical spine: Normal marrow signal. Sinuses/Orbits: Negative. Other: None. IMPRESSION: 1. Unchanged appearance of mass centered on the right thalamus with extension into the midbrain involving the cerebral aqueduct and posterior aspect of the third ventricle with evidence of subependymal spread. 2. Unchanged obstructive hydrocephalus with periventricular T2 hyperintensity likely representing transependymal edema. Electronically Signed   By: KPedro EarlsM.D.   On: 03/17/2019 09:39   MR Brain W Wo Contrast  Addendum Date: 03/08/2019   ADDENDUM REPORT: 03/08/2019 12:13 ADDENDUM: These results were called by telephone at  the time of interpretation on 03/08/2019 at 12:13 pm to [facility], who verbally acknowledged these results. Electronically Signed   By: CFranchot GalloM.D.   On: 03/08/2019 12:13   Result Date: 03/08/2019 CLINICAL DATA:  Impairment of cognitive function. Cerebellar ataxia. Neurofibromatosis. EXAM: MRI HEAD WITHOUT AND WITH CONTRAST TECHNIQUE: Multiplanar, multiecho pulse sequences of the brain and surrounding structures were obtained without and with intravenous contrast. CONTRAST:  8 mL Gadovist IV COMPARISON:  None. FINDINGS: Brain: Obstructive hydrocephalus. Dilatation of lateral and third ventricles due to mass lesion in the right thalamus and midbrain. Ventricles are rounded and dilated with periventricular resorption of CSF in the white matter bilaterally. Enhancing mass lesion involving the right thalamus extending into the midbrain on the right. There is displacement and compression of the aqueduct causing hydrocephalus. The mass shows  patchy peripheral enhancement and measures approximately 3.7 x 2.7 x 2.9 cm. No associated hemorrhage. The mass shows facilitated diffusion. No other mass lesion identified. No acute infarct Vascular: Normal arterial flow voids Skull and upper cervical spine: No focal skeletal lesion. Sphenoid bone appears normal and symmetric bilaterally. Sinuses/Orbits: Mild mucosal edema paranasal sinuses. Negative orbit. Other: None IMPRESSION: Obstructive hydrocephalus due to a mass lesion in the right thalamus and right midbrain. Mass shows patchy peripheral enhancement. Given the history of neurofibromatosis, this is likely a glioma. Electronically Signed: By: Franchot Gallo M.D. On: 03/08/2019 12:03   MR CERVICAL SPINE W WO CONTRAST  Result Date: 03/18/2019 CLINICAL DATA:  44 year old male with neurofibromatosis. 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 EXAM: MRI CERVICAL SPINE WITHOUT AND WITH CONTRAST TECHNIQUE: Multiplanar and multiecho pulse  sequences of the cervical spine, to include the craniocervical junction and cervicothoracic junction, were obtained without and with intravenous contrast. CONTRAST:  81m GADAVIST GADOBUTROL 1 MMOL/ML IV SOLN COMPARISON:  Brain MRI 03/16/2019 and earlier. FINDINGS: Alignment: Preserved cervical lordosis. Vertebrae: No marrow edema or evidence of acute osseous abnormality. Visualized bone marrow signal is within normal limits. Cord: Visible cervical and upper thoracic spinal cord signal and morphology are within normal limits. No abnormal intradural enhancement is identified. No dural thickening. Posterior Fossa, vertebral arteries, paraspinal tissues: Cervicomedullary junction is within normal limits. No definite posterior fossa leptomeningeal enhancement identified on these images. Preserved major vascular flow voids in the neck. The right vertebral artery appears mildly dominant. No cervical paraspinal or neck mass is identified. There is increased conspicuity of the bilateral exiting T1 nerves in the upper thoracic spine (series 5, images 35 and 36), which may be mildly hyperenhancing. Negative visible lung apices. Disc levels: Generally mild for age cervical spine degeneration. There is broad-based rightward disc bulge or protrusion at C6-C7 with some endplate spurring resulting in mild to moderate right C7 neural foraminal stenosis. No cervical spinal stenosis or other neural impingement identified. IMPRESSION: 1. No leptomeningeal disease identified in the cervical spine. Normal cervical spinal cord. 2. Questionable mild bilateral T1 exiting nerve neurofibromas. 3. Mild for age cervical spine degeneration, disc and endplate related mild to moderate right C7 neural foraminal stenosis. 4. Thoracic and lumbar MRI today reported separately. Electronically Signed   By: HGenevie AnnM.D.   On: 03/18/2019 13:53   MR THORACIC SPINE W WO CONTRAST  Result Date: 03/18/2019 CLINICAL DATA:  44year old male with  neurofibromatosis. 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 EXAM: MRI THORACIC WITHOUT AND WITH CONTRAST TECHNIQUE: Multiplanar and multiecho pulse sequences of the thoracic spine were obtained without and with intravenous contrast. CONTRAST:  854mGADAVIST GADOBUTROL 1 MMOL/ML IV SOLN in conjunction with contrast enhanced imaging of the cervical spine reported separately. COMPARISON:  Cervical spine MRI today.  Brain MRI 03/16/2019 FINDINGS: Limited cervical spine imaging:  Reported separately today. Thoracic spine segmentation:  Appears normal. Alignment:  Preserved thoracic kyphosis.  No spondylolisthesis. Vertebrae: No marrow edema or evidence of acute osseous abnormality. Visualized bone marrow signal is within normal limits. Cord: Intermittent central thoracic spinal cord T2 hyperintensity from the T5-T6 level through T9-T10. The abnormality never measures more than 1-2 mm (series 26, image 21 at T7). There is no associated thoracic spinal cord expansion or cord edema. There is no associated intra-axial cord enhancement. No spinal cord leptomeningeal or other abnormal intradural enhancement is identified. No dural thickening. The conus medullaris remains normal at T12-L1. Paraspinal and other soft tissues: Trace layering pleural  fluid with mild lower lung atelectasis suspected. Negative visible thoracic inlet. Otherwise negative visible thoracic and upper abdominal viscera. Increased conspicuity of the exiting T1 nerves again noted on series 28, image 4 as seen in the cervical spine. Other thoracic paraspinal soft tissues are within normal limits. No thoracic paraspinal mass identified. Disc levels: Mild for age degeneration in the thoracic spine as seen in the cervical spine. There is mild upper thoracic facet hypertrophy. No thoracic spinal stenosis or convincing neural impingement. IMPRESSION: 1. Positive for intermittent small thoracic spinal cord syrinx. 2. But negative for cord expansion,  cord edema, intra-axial or leptomeningeal metastatic disease in the thoracic spine. 3. Mild exiting T1 nerve prominence as seen in the cervical spine. Otherwise no thoracic paraspinal tumor. 4. Mild for age thoracic spine degeneration with no spinal stenosis or neural impingement. Electronically Signed   By: Genevie Ann M.D.   On: 03/18/2019 13:59   MR Lumbar Spine W Wo Contrast  Result Date: 03/18/2019 CLINICAL DATA:  44 year old male with neurofibromatosis. 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 EXAM: MRI LUMBAR SPINE WITHOUT AND WITH CONTRAST TECHNIQUE: Multiplanar and multiecho pulse sequences of the lumbar spine were obtained without and with intravenous contrast. CONTRAST:  57m GADAVIST GADOBUTROL 1 MMOL/ML IV SOLN in conjunction with contrast enhanced imaging of the cervical and thoracic spines reported separately. COMPARISON:  Thoracic spine MRI reported separately today. FINDINGS: Segmentation: Normal, concordant with the thoracic spine numbering today. Alignment: Mild straightening of lumbar lordosis. No spondylolisthesis. Vertebrae: No marrow edema or evidence of acute osseous abnormality. Visualized bone marrow signal is within normal limits. Intact visible sacrum and SI joints. Conus medullaris and cauda equina: Conus extends to the T12-L1 level. No lower spinal cord or conus signal abnormality. No abnormal intradural enhancement. Cauda equina nerve roots appear symmetric and normal. No dural thickening. Paraspinal and other soft tissues: Negative visible abdominal viscera. No lumbar paraspinal tumor identified. Disc levels: Mild for age lumbar spine degeneration. L4-L5 disc desiccation with mild circumferential disc bulge. Broad-based mild posterior component of disc with annular fissure (series 1, image 8). Mild posterior element hypertrophy. No spinal or convincing lateral recess stenosis. Up to mild bilateral L4 foraminal stenosis. IMPRESSION: 1. Normal conus medullaris and cauda  equina, no leptomeningeal disease in the lumbosacral spine. 2. No lumbar paraspinal tumor identified. 3. Mild lumbar spine degeneration limited to L4-L5 with up to mild bilateral L4 foraminal stenosis. Electronically Signed   By: HGenevie AnnM.D.   On: 03/18/2019 14:03       IMPRESSION/PLAN: 1. 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 Dr. MLisbeth Renshawdiscusses the pathology findings and reviews the nature of primary brain disease and the goal to offer a definitive treatment with curative intent along with chemosensitization. He is awaiting the medication to be delivered.  We discussed the risks, benefits, short, and long term effects of radiotherapy, and the patient is interested in proceeding. Dr. MLisbeth Renshawdiscusses the delivery and logistics of radiotherapy and anticipates a course of 6 weeks of radiotherapy. He will come in tomorrow at 11 am for simulation at which time he will sign written consent to proceed.   This encounter was provided by telemedicine platform [employer].  The patient has provided two factor identification and has given verbal consent for this type of encounter and has been advised to only accept a meeting of this type in a secure network environment. The time spent during this encounter was 60 minutes including preparation, discussion, and coordination of the patient's  care. The attendants for this meeting include Blenda Nicely, RN, Dr. Lisbeth Renshaw, Hayden Pedro  and Vanita Ingles.  During the encounter,  Blenda Nicely, RN, Dr. Lisbeth Renshaw, and Hayden Pedro were located at [facility] Radiation Oncology Department.  Aaron Espinoza was located at home.    The above documentation reflects my direct findings during this shared patient visit. Please see the separate note by Dr. Lisbeth Renshaw on this date for the remainder of the patient's plan of care.    Carola Rhine, PAC

## 2019-03-31 NOTE — Progress Notes (Signed)
Location/Histology of Brain Tumor: Anaplastic astrocytoma- Right Thalamic  Patient presented with symptoms of:  Progressive confusion, coordination, and gait issues, and headaches.  MRI C/L/T Spine 04/09/2019:  MRI L Spine 03/18/2019: Normal conus medullaris and cauda equina, no leptomeningeal disease in the lumbosacral spine.  2. No lumbar paraspinal tumor identified.  3. Mild lumbar spine degeneration limited to L4-L5 with up to mild bilateral L4 foraminal stenosis.  MRI C Spine 03/18/2019: No leptomeningeal disease identified in the cervical spine.  Normal cervical spinal cord.  2. Questionable mild bilateral T1 exiting nerve neurofibromas.  3. Mild for age cervical spine degeneration, disc and endplate related mild to moderate right C7 neural foraminal stenosis.  MRI T spine 03/18/2019: Positive for intermittent small thoracic spinal cord syrinx.  2. But negative for cord expansion, cord edema, intra-axial or leptomeningeal metastatic disease in the thoracic spine.  3. Mild exiting T1 nerve prominence as seen in the cervical spine.  Otherwise no thoracic paraspinal tumor.  4. Mild for age thoracic spine degeneration with no spinal stenosis or neural impingement.  CT Head 03/18/2019: Improved ventriculomegaly after third ventriculostomy. No complicating hemorrhage or infarct.  MRI Brain 03/17/2019: Unchanged appearance of mass centered on the right thalamus with extension into the midbrain involving the cerebral aqueduct and posterior aspect of the third ventricle with evidence of subependymal spread.  Unchanged obstructive hydrocephalus with periventricular T2 hyperintensity likely representing transependymal edema.  MRI Brain 03/08/2019: Obstructive hydrocephalus due to a mass lesion in the right thalamus and right midbrain.  Mass shows patchy peripheral enhancement.  Given the history of neruofibromatosis, this is likely a glioma. 3.7 x 2.7 x 2.9 cm.  Pathology 03/17/2019   Past or anticipated  interventions, if any, per neurosurgery:  Dr. Marcello Moores 03/17/19 -He was found to have a infiltrating partially enhancing lesion in his right thalamus and midbrain which was causing obstructive hydrocephalus. -Given the concerns could represent a malignant primary brain tumor, I recommended stereotactic biopsy of this lesion. -Additionally with his obstructive hydrocephalus, I recommended endoscopic third ventriculostomy to treat this while potentially avoiding placement of a shunt.   Past or anticipated interventions, if any, per medical oncology:  Dr. Mickeal Skinner 03/30/2019 - At this time our recommendation is to proceed with intensity modulated radiation therapy with concurrent daily temozolomide, given over 6 weeks. -Next step will be consultation with radiation oncology and CT simulation. -The patient is not a candidate for a research protocol at this time due to no suitable study identified.  Dose of Decadron, if applicable: On taper, current dose 1 mg BID, end date 04/09/2019  Recent neurologic symptoms, if any:   Seizures: No, stopped keppra  Headaches: Occasional, takes tylenol  Nausea: No  Dizziness/ataxia: No  Difficulty with hand coordination: No  Focal numbness/weakness: numbness/tingling in bilateral feet   Visual deficits/changes: Decreased vision in both eyes.  Confusion/Memory deficits: no   SAFETY ISSUES:  Prior radiation? No  Pacemaker/ICD? No  Possible current pregnancy? No  Is the patient on methotrexate? No  Additional Complaints / other details:

## 2019-04-01 ENCOUNTER — Other Ambulatory Visit: Payer: Self-pay | Admitting: Internal Medicine

## 2019-04-01 ENCOUNTER — Ambulatory Visit: Payer: BC Managed Care – PPO | Admitting: Radiation Oncology

## 2019-04-01 ENCOUNTER — Telehealth: Payer: Self-pay | Admitting: Pharmacist

## 2019-04-01 ENCOUNTER — Telehealth: Payer: Self-pay | Admitting: Internal Medicine

## 2019-04-01 ENCOUNTER — Other Ambulatory Visit: Payer: Self-pay

## 2019-04-01 ENCOUNTER — Ambulatory Visit
Admission: RE | Admit: 2019-04-01 | Discharge: 2019-04-01 | Disposition: A | Discharge status: Home / Self Care | Payer: BC Managed Care – PPO | Source: Ambulatory Visit | Attending: Radiation Oncology | Admitting: Radiation Oncology

## 2019-04-01 DIAGNOSIS — C710 Malignant neoplasm of cerebrum, except lobes and ventricles: Secondary | ICD-10-CM | POA: Diagnosis not present

## 2019-04-01 MED ORDER — TEMOZOLOMIDE 140 MG PO CAPS: 140.0000 mg | ORAL_CAPSULE | Freq: Every day | ORAL | 0 refills | Status: DC

## 2019-04-01 MED ORDER — ONDANSETRON HCL 8 MG PO TABS: 8.0000 mg | ORAL_TABLET | Freq: Two times a day (BID) | ORAL | 1 refills | Status: DC | PRN

## 2019-04-01 NOTE — Progress Notes (Signed)
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

## 2019-04-01 NOTE — Telephone Encounter (Signed)
Oral Oncology Pharmacist Encounter  Received new prescription for Temodar (temozolomide) for the treatment of anaplastic astrocytoma in conjunction with RT, planned duration until the end of radiation.  CBC from 03/18/19 assessed, no relevant lab abnormalities. Prescription dose and frequency assessed.   Current medication list in Epic reviewed, no DDIs with temozolomide identified.  Prescription has been e-scribed to [facility] for benefits analysis and approval.  Oral Oncology Clinic will continue to follow for insurance authorization, copayment issues, initial counseling and start date.  Darl Pikes, PharmD, BCPS, [facility] Hematology/Oncology Clinical Pharmacist [facility]/AP [facility] [phone]  04/01/2019 9:28 AM

## 2019-04-01 NOTE — Telephone Encounter (Signed)
Sch 3/22, 4/5, 4/16 appt per 2/25 sch msg. Confirmed appt dates and times with pt.

## 2019-04-02 DIAGNOSIS — R279 Unspecified lack of coordination: Secondary | ICD-10-CM | POA: Diagnosis not present

## 2019-04-02 DIAGNOSIS — M6281 Muscle weakness (generalized): Secondary | ICD-10-CM | POA: Diagnosis not present

## 2019-04-05 DIAGNOSIS — M6281 Muscle weakness (generalized): Secondary | ICD-10-CM | POA: Diagnosis not present

## 2019-04-05 DIAGNOSIS — R279 Unspecified lack of coordination: Secondary | ICD-10-CM | POA: Diagnosis not present

## 2019-04-09 ENCOUNTER — Other Ambulatory Visit (HOSPITAL_COMMUNITY): Payer: BC Managed Care – PPO

## 2019-04-09 ENCOUNTER — Encounter (HOSPITAL_COMMUNITY): Payer: Self-pay

## 2019-04-09 ENCOUNTER — Ambulatory Visit (HOSPITAL_COMMUNITY): Payer: BC Managed Care – PPO

## 2019-04-09 DIAGNOSIS — Z510 Encounter for antineoplastic radiation therapy: Secondary | ICD-10-CM | POA: Diagnosis not present

## 2019-04-09 DIAGNOSIS — C710 Malignant neoplasm of cerebrum, except lobes and ventricles: Secondary | ICD-10-CM | POA: Insufficient documentation

## 2019-04-12 ENCOUNTER — Ambulatory Visit
Admission: RE | Admit: 2019-04-12 | Discharge: 2019-04-12 | Disposition: A | Discharge status: Home / Self Care | Payer: BC Managed Care – PPO | Source: Ambulatory Visit | Attending: Radiation Oncology | Admitting: Radiation Oncology

## 2019-04-12 ENCOUNTER — Encounter (HOSPITAL_COMMUNITY): Payer: Self-pay

## 2019-04-12 ENCOUNTER — Other Ambulatory Visit: Payer: Self-pay

## 2019-04-12 DIAGNOSIS — R279 Unspecified lack of coordination: Secondary | ICD-10-CM | POA: Diagnosis not present

## 2019-04-12 DIAGNOSIS — Z510 Encounter for antineoplastic radiation therapy: Secondary | ICD-10-CM | POA: Diagnosis not present

## 2019-04-12 DIAGNOSIS — M6281 Muscle weakness (generalized): Secondary | ICD-10-CM | POA: Diagnosis not present

## 2019-04-12 DIAGNOSIS — C710 Malignant neoplasm of cerebrum, except lobes and ventricles: Secondary | ICD-10-CM | POA: Diagnosis not present

## 2019-04-13 ENCOUNTER — Other Ambulatory Visit: Payer: Self-pay

## 2019-04-13 ENCOUNTER — Telehealth: Payer: Self-pay

## 2019-04-13 ENCOUNTER — Ambulatory Visit
Admission: RE | Admit: 2019-04-13 | Discharge: 2019-04-13 | Disposition: A | Discharge status: Home / Self Care | Payer: BC Managed Care – PPO | Source: Ambulatory Visit | Attending: Radiation Oncology | Admitting: Radiation Oncology

## 2019-04-13 DIAGNOSIS — Z510 Encounter for antineoplastic radiation therapy: Secondary | ICD-10-CM | POA: Diagnosis not present

## 2019-04-13 DIAGNOSIS — C710 Malignant neoplasm of cerebrum, except lobes and ventricles: Secondary | ICD-10-CM | POA: Diagnosis not present

## 2019-04-13 NOTE — Telephone Encounter (Signed)
Spoke with [facility] and verified they had spoken with Yuma's wife and Temodar was shipped out to patient on 04/06/19.  Vincent Patient Paul Phone [phone] Fax [phone] 04/13/2019 1:56 PM

## 2019-04-14 ENCOUNTER — Other Ambulatory Visit: Payer: Self-pay

## 2019-04-14 ENCOUNTER — Ambulatory Visit
Admission: RE | Admit: 2019-04-14 | Discharge: 2019-04-14 | Disposition: A | Discharge status: Home / Self Care | Payer: BC Managed Care – PPO | Source: Ambulatory Visit | Attending: Radiation Oncology | Admitting: Radiation Oncology

## 2019-04-14 DIAGNOSIS — R279 Unspecified lack of coordination: Secondary | ICD-10-CM | POA: Diagnosis not present

## 2019-04-14 DIAGNOSIS — C710 Malignant neoplasm of cerebrum, except lobes and ventricles: Secondary | ICD-10-CM | POA: Diagnosis not present

## 2019-04-14 DIAGNOSIS — M6281 Muscle weakness (generalized): Secondary | ICD-10-CM | POA: Diagnosis not present

## 2019-04-14 DIAGNOSIS — Z510 Encounter for antineoplastic radiation therapy: Secondary | ICD-10-CM | POA: Diagnosis not present

## 2019-04-14 LAB — SURGICAL PATHOLOGY

## 2019-04-14 NOTE — Progress Notes (Signed)
  Radiation Oncology         [phone] ________________________________  Name: Aaron Espinoza MRN: [id number]  Date: 04/01/2019  DOB: 1975-06-26  SIMULATION AND TREATMENT PLANNING NOTE  DIAGNOSIS:     ICD-10-CM   1. 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  Identity was confirmed.  All relevant records and images related to the planned course of therapy were reviewed.   Written consent to proceed with treatment was confirmed which was freely given after reviewing the details related to the planned course of therapy had been reviewed with the patient.  Then, the patient was set-up in a stable reproducible  supine position for radiation therapy.  CT images were obtained.  Surface markings were placed.    Medically necessary complex treatment device(s) for immobilization:  customized thermoplastic mask/ headcast.   The CT images were loaded into the planning software.  Then the target and avoidance structures were contoured.  Treatment planning then occurred.  The radiation prescription was entered and confirmed.    This is based on the patient's tomotherapy sinogram.  Each of these customized fields/ complex treatment devices will be used on a daily basis during the radiation course. I have requested : IMRT.  This is medically necessary due to the close proximity of the target region to critical normal brain structures.  I have requested a DVH of the following structures: PTV, brain, brainstem, optic chiasm, optic nerves.  The patient will undergo daily image guidance to ensure accurate localization of the target, and adequate minimize dose to the normal surrounding structures in close proximity to the target.   PLAN:  The patient will receive 46 Gy in 23 fractions initially. The patient will then receive a 14 Gy boost for a total dose of 60 Gy.    Special treatment procedure The patient  will also receive concurrent chemotherapy during the treatment. The patient may therefore experience increased toxicity or side effects and the patient will be monitored for such problems. This may require extra lab work as necessary. This therefore constitutes a special treatment procedure.   ________________________________   Jodelle Gross, MD, PhD

## 2019-04-15 ENCOUNTER — Other Ambulatory Visit: Payer: Self-pay

## 2019-04-15 ENCOUNTER — Ambulatory Visit
Admission: RE | Admit: 2019-04-15 | Discharge: 2019-04-15 | Disposition: A | Discharge status: Home / Self Care | Payer: BC Managed Care – PPO | Source: Ambulatory Visit | Attending: Radiation Oncology | Admitting: Radiation Oncology

## 2019-04-15 DIAGNOSIS — Z510 Encounter for antineoplastic radiation therapy: Secondary | ICD-10-CM | POA: Diagnosis not present

## 2019-04-15 DIAGNOSIS — C710 Malignant neoplasm of cerebrum, except lobes and ventricles: Secondary | ICD-10-CM | POA: Diagnosis not present

## 2019-04-16 ENCOUNTER — Other Ambulatory Visit: Payer: Self-pay

## 2019-04-16 ENCOUNTER — Ambulatory Visit
Admission: RE | Admit: 2019-04-16 | Discharge: 2019-04-16 | Disposition: A | Discharge status: Home / Self Care | Payer: BC Managed Care – PPO | Source: Ambulatory Visit | Attending: Radiation Oncology | Admitting: Radiation Oncology

## 2019-04-16 DIAGNOSIS — C710 Malignant neoplasm of cerebrum, except lobes and ventricles: Secondary | ICD-10-CM

## 2019-04-16 DIAGNOSIS — Z510 Encounter for antineoplastic radiation therapy: Secondary | ICD-10-CM | POA: Diagnosis not present

## 2019-04-16 MED ORDER — SONAFINE EX EMUL
1.0000 "application " | Freq: Once | CUTANEOUS | Status: AC
  Administered 2019-04-16: 1 via TOPICAL

## 2019-04-16 NOTE — Progress Notes (Signed)
Pt here for patient teaching.  Pt given Radiation and You booklet, skin care instructions and Sonafine.  Reviewed areas of pertinence such as fatigue, hair loss, nausea and vomiting, skin changes, headache and blurry vision . Pt able to give teach back of to pat skin and use unscented/gentle soap,apply Sonafine bid and avoid applying anything to skin within 4 hours of treatment. Pt verbalizes understanding of information given and will contact nursing with any questions or concerns.    Gloriajean Dell. Leonie Green, BSN

## 2019-04-19 ENCOUNTER — Other Ambulatory Visit: Payer: Self-pay

## 2019-04-19 ENCOUNTER — Ambulatory Visit
Admission: RE | Admit: 2019-04-19 | Discharge: 2019-04-19 | Disposition: A | Discharge status: Home / Self Care | Payer: BC Managed Care – PPO | Source: Ambulatory Visit | Attending: Radiation Oncology | Admitting: Radiation Oncology

## 2019-04-19 DIAGNOSIS — M6281 Muscle weakness (generalized): Secondary | ICD-10-CM | POA: Diagnosis not present

## 2019-04-19 DIAGNOSIS — C710 Malignant neoplasm of cerebrum, except lobes and ventricles: Secondary | ICD-10-CM | POA: Diagnosis not present

## 2019-04-19 DIAGNOSIS — Z510 Encounter for antineoplastic radiation therapy: Secondary | ICD-10-CM | POA: Diagnosis not present

## 2019-04-19 DIAGNOSIS — R279 Unspecified lack of coordination: Secondary | ICD-10-CM | POA: Diagnosis not present

## 2019-04-20 ENCOUNTER — Other Ambulatory Visit: Payer: Self-pay

## 2019-04-20 ENCOUNTER — Ambulatory Visit
Admission: RE | Admit: 2019-04-20 | Discharge: 2019-04-20 | Disposition: A | Discharge status: Home / Self Care | Payer: BC Managed Care – PPO | Source: Ambulatory Visit | Attending: Radiation Oncology | Admitting: Radiation Oncology

## 2019-04-20 DIAGNOSIS — Z510 Encounter for antineoplastic radiation therapy: Secondary | ICD-10-CM | POA: Diagnosis not present

## 2019-04-20 DIAGNOSIS — C710 Malignant neoplasm of cerebrum, except lobes and ventricles: Secondary | ICD-10-CM | POA: Diagnosis not present

## 2019-04-21 ENCOUNTER — Ambulatory Visit
Admission: RE | Admit: 2019-04-21 | Discharge: 2019-04-21 | Disposition: A | Discharge status: Home / Self Care | Payer: BC Managed Care – PPO | Source: Ambulatory Visit | Attending: Radiation Oncology | Admitting: Radiation Oncology

## 2019-04-21 ENCOUNTER — Other Ambulatory Visit: Payer: Self-pay

## 2019-04-21 DIAGNOSIS — C710 Malignant neoplasm of cerebrum, except lobes and ventricles: Secondary | ICD-10-CM | POA: Diagnosis not present

## 2019-04-21 DIAGNOSIS — M6281 Muscle weakness (generalized): Secondary | ICD-10-CM | POA: Diagnosis not present

## 2019-04-21 DIAGNOSIS — R279 Unspecified lack of coordination: Secondary | ICD-10-CM | POA: Diagnosis not present

## 2019-04-21 DIAGNOSIS — Z510 Encounter for antineoplastic radiation therapy: Secondary | ICD-10-CM | POA: Diagnosis not present

## 2019-04-22 ENCOUNTER — Other Ambulatory Visit: Payer: Self-pay

## 2019-04-22 ENCOUNTER — Ambulatory Visit
Admission: RE | Admit: 2019-04-22 | Discharge: 2019-04-22 | Disposition: A | Discharge status: Home / Self Care | Payer: BC Managed Care – PPO | Source: Ambulatory Visit | Attending: Radiation Oncology | Admitting: Radiation Oncology

## 2019-04-22 DIAGNOSIS — Z510 Encounter for antineoplastic radiation therapy: Secondary | ICD-10-CM | POA: Diagnosis not present

## 2019-04-22 DIAGNOSIS — C710 Malignant neoplasm of cerebrum, except lobes and ventricles: Secondary | ICD-10-CM | POA: Diagnosis not present

## 2019-04-23 ENCOUNTER — Ambulatory Visit
Admission: RE | Admit: 2019-04-23 | Discharge: 2019-04-23 | Disposition: A | Discharge status: Home / Self Care | Payer: BC Managed Care – PPO | Source: Ambulatory Visit | Attending: Radiation Oncology | Admitting: Radiation Oncology

## 2019-04-23 ENCOUNTER — Other Ambulatory Visit: Payer: Self-pay

## 2019-04-23 DIAGNOSIS — Z510 Encounter for antineoplastic radiation therapy: Secondary | ICD-10-CM | POA: Diagnosis not present

## 2019-04-23 DIAGNOSIS — C710 Malignant neoplasm of cerebrum, except lobes and ventricles: Secondary | ICD-10-CM | POA: Diagnosis not present

## 2019-04-26 ENCOUNTER — Ambulatory Visit
Admission: RE | Admit: 2019-04-26 | Discharge: 2019-04-26 | Disposition: A | Discharge status: Home / Self Care | Payer: BC Managed Care – PPO | Source: Ambulatory Visit | Attending: Radiation Oncology | Admitting: Radiation Oncology

## 2019-04-26 ENCOUNTER — Inpatient Hospital Stay: Payer: BC Managed Care – PPO | Attending: Internal Medicine | Admitting: Internal Medicine

## 2019-04-26 ENCOUNTER — Inpatient Hospital Stay: Payer: BC Managed Care – PPO

## 2019-04-26 ENCOUNTER — Other Ambulatory Visit: Payer: Self-pay

## 2019-04-26 VITALS — BP 148/80 | HR 50 | Temp 98.7°F | Resp 18 | Ht 72.0 in | Wt 181.4 lb

## 2019-04-26 DIAGNOSIS — Q8500 Neurofibromatosis, unspecified: Secondary | ICD-10-CM | POA: Insufficient documentation

## 2019-04-26 DIAGNOSIS — N400 Enlarged prostate without lower urinary tract symptoms: Secondary | ICD-10-CM | POA: Diagnosis not present

## 2019-04-26 DIAGNOSIS — Z87891 Personal history of nicotine dependence: Secondary | ICD-10-CM | POA: Insufficient documentation

## 2019-04-26 DIAGNOSIS — Z510 Encounter for antineoplastic radiation therapy: Secondary | ICD-10-CM | POA: Diagnosis not present

## 2019-04-26 DIAGNOSIS — Z9221 Personal history of antineoplastic chemotherapy: Secondary | ICD-10-CM | POA: Insufficient documentation

## 2019-04-26 DIAGNOSIS — C710 Malignant neoplasm of cerebrum, except lobes and ventricles: Secondary | ICD-10-CM

## 2019-04-26 LAB — CBC WITH DIFFERENTIAL (CANCER CENTER ONLY)
Abs Immature Granulocytes: 0.05 10*3/uL (ref 0.00–0.07)
Basophils Absolute: 0 10*3/uL (ref 0.0–0.1)
Basophils Relative: 0 %
Eosinophils Absolute: 0.1 10*3/uL (ref 0.0–0.5)
Eosinophils Relative: 1 %
HCT: 47.3 % (ref 39.0–52.0)
Hemoglobin: 16.2 g/dL (ref 13.0–17.0)
Immature Granulocytes: 1 %
Lymphocytes Relative: 19 %
Lymphs Abs: 1.2 10*3/uL (ref 0.7–4.0)
MCH: 29.9 pg (ref 26.0–34.0)
MCHC: 34.2 g/dL (ref 30.0–36.0)
MCV: 87.3 fL (ref 80.0–100.0)
Monocytes Absolute: 0.8 10*3/uL (ref 0.1–1.0)
Monocytes Relative: 13 %
Neutro Abs: 4.2 10*3/uL (ref 1.7–7.7)
Neutrophils Relative %: 66 %
Platelet Count: 262 10*3/uL (ref 150–400)
RBC: 5.42 MIL/uL (ref 4.22–5.81)
RDW: 11.9 % (ref 11.5–15.5)
WBC Count: 6.4 10*3/uL (ref 4.0–10.5)
nRBC: 0 % (ref 0.0–0.2)

## 2019-04-26 LAB — CMP (CANCER CENTER ONLY)
ALT: 39 U/L (ref 0–44)
AST: 18 U/L (ref 15–41)
Albumin: 4.1 g/dL (ref 3.5–5.0)
Alkaline Phosphatase: 68 U/L (ref 38–126)
Anion gap: 8 (ref 5–15)
BUN: 11 mg/dL (ref 6–20)
CO2: 29 mmol/L (ref 22–32)
Calcium: 9.9 mg/dL (ref 8.9–10.3)
Chloride: 103 mmol/L (ref 98–111)
Creatinine: 0.86 mg/dL (ref 0.61–1.24)
GFR, Est AFR Am: >60 mL/min (ref >60)
GFR, Estimated: >60 mL/min (ref >60)
Glucose, Bld: 81 mg/dL (ref 70–99)
Potassium: 4.7 mmol/L (ref 3.5–5.1)
Sodium: 140 mmol/L (ref 135–145)
Total Bilirubin: 0.7 mg/dL (ref 0.3–1.2)
Total Protein: 7.1 g/dL (ref 6.5–8.1)

## 2019-04-26 NOTE — Progress Notes (Signed)
[place] at [place], [place] [address] [phone]   Interval Evaluation  Date of Service: 04/26/19 Patient Name: Aaron Espinoza Patient MRN: [id number] Patient DOB: 07/03/1975 Provider: Ventura Sellers, MD  Identifying Statement:  Aaron Espinoza is a 44 y.o. 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 Marcello Moores; path demonstrates anaplastic astrocytoma   04/01/2019 -  Chemotherapy   The patient had [No matching medication found in this treatment plan]  for chemotherapy treatment.      Biomarkers:  MGMT Unknown.  IDH 1/2 Wild type.  EGFR Unknown  TERT Unknown   Interval History:  Aaron Espinoza presents to clinic for follow up now having completed 2 weeks of IMRT and concurrent Temodar.  No issues with radiation or chemotherapy.  He does describe near daily headaches, tension type.  He has been dosing Tylenol with good efficacy, although hasn't had analgesia-free days in several weeks.  Maintains full functional independence.  H+P (03/30/19) Patient presented to medical attention in late January, when he noticed mild confusion, gait imbalance.  These symptoms worsened over the next ~10 days, and were accompanied by incontinence.  Brain MRI was performed and demonstrated a right thalamic mass consistent with likely primary brain tumor.  He underwent stereotactic biopsy and ventriculostomy on 03/17/19 with Dr. Marcello Moores.  Following surgery he describes considerable improvement in most of his symptoms; at this time he is near baseline function but "very fatigued" overall.  No seizures or headaches since surgery.  Medications: Current Outpatient Medications on File Prior to Visit  Medication Sig Dispense Refill  . acetaminophen (TYLENOL) 500 MG tablet Take 1,000 mg by mouth every 6 (six) hours as  needed.    . docusate sodium (COLACE) 100 MG capsule Take 100 mg by mouth daily.    . ondansetron (ZOFRAN) 8 MG tablet Take 1 tablet (8 mg total) by mouth 2 (two) times daily as needed (nausea and vomiting). May take 30-60 minutes prior to Temodar administration if nausea/vomiting occurs. 30 tablet 1  . polyethylene glycol (MIRALAX / GLYCOLAX) 17 g packet Take 1 packet by mouth daily.    Marland Kitchen temozolomide (TEMODAR) 140 MG capsule Take 1 capsule (140 mg total) by mouth daily. May take on an empty stomach to decrease nausea & vomiting. 42 capsule 0   No current facility-administered medications on file prior to visit.    Allergies: No Known Allergies Past Medical History:  Past Medical History:  Diagnosis Date  . BPH (benign prostatic hyperplasia) 07/20/2015  . Brain mass   . Cognitive dysfunction 01/2019  . Fatigue 01/2019  . Headache   . NF (neurofibromatosis) ([place]) 07/20/2015   NF1   Past Surgical History:  Past Surgical History:  Procedure Laterality Date  . HAND SURGERY Left    pinky - tumor benign  . LEG SURGERY Left    inner thigh tumor - benign  . VENTRICULOSTOMY N/A 03/17/2019   Procedure: ENDOSCOPIC THIRD VENTRICULOSTOMY;  Surgeon: Vallarie Mare, MD;  Location: [place] [facility];  Service: Neurosurgery;  Laterality: N/A;  ENDOSCOPIC THIRD VENTRICULOSTOMY   Social History:  Social History   Socioeconomic History  . Marital status: Married    Spouse name: Not on file  . Number of children: Not on file  . Years of education: Not on file  . Highest education level: Not on file  Occupational History  . Not on file  Tobacco Use  . Smoking status: Former Smoker    Types: Cigars, Cigarettes  . Smokeless tobacco: Never Used  . Tobacco comment: Quit smoking cigarettes 25 yrs ago, quit cigars in 2020  Substance and Sexual Activity  . Alcohol use: Yes    Alcohol/week: 6.0 - 9.0 standard drinks    Types: 6 - 9 Standard drinks or equivalent per week  . Drug use: No  . Sexual  activity: Not on file  Other Topics Concern  . Not on file  Social History Narrative  . Not on file   Social Determinants of Health   Financial Resource Strain:   . Difficulty of Paying Living Expenses:   Food Insecurity:   . Worried About Charity fundraiser in the Last Year:   . Arboriculturist in the Last Year:   Transportation Needs:   . Film/video editor (Medical):   Marland Kitchen Lack of Transportation (Non-Medical):   Physical Activity:   . Days of Exercise per Week:   . Minutes of Exercise per Session:   Stress:   . Feeling of Stress :   Social Connections:   . Frequency of Communication with Friends and Family:   . Frequency of Social Gatherings with Friends and Family:   . Attends Religious Services:   . Active Member of Clubs or Organizations:   . Attends Archivist Meetings:   Marland Kitchen Marital Status:   Intimate Partner Violence:   . Fear of Current or Ex-Partner:   . Emotionally Abused:   Marland Kitchen Physically Abused:   . Sexually Abused:    Family History:  Family History  Problem Relation Age of Onset  . Cancer Paternal Grandfather        Prostate    Review of Systems: Constitutional: Doesn't report fevers, chills or abnormal weight loss Eyes: Doesn't report blurriness of vision Ears, nose, mouth, throat, and face: Doesn't report sore throat Respiratory: Doesn't report cough, dyspnea or wheezes Cardiovascular: Doesn't report palpitation, chest discomfort  Gastrointestinal:  Doesn't report nausea, constipation, diarrhea GU: Doesn't report incontinence Skin: Doesn't report skin rashes Neurological: Per HPI Musculoskeletal: Doesn't report joint pain Behavioral/Psych: Doesn't report anxiety  Physical Exam: Vitals:   04/26/19 1226  BP: (!) 148/80  Pulse: (!) 50  Resp: 18  Temp: 98.7 F (37.1 C)  SpO2: 100%   KPS: 90. General: Alert, cooperative, pleasant, in no acute distress Head: Normal EENT: No conjunctival injection or scleral icterus.  Lungs: Resp  effort normal Cardiac: Regular rate Abdomen: Non-distended abdomen Skin: No rashes cyanosis or petechiae. Extremities: No clubbing or edema  Neurologic Exam: Mental Status: Awake, alert, attentive to examiner. Oriented to self and environment. Language is fluent with intact comprehension.  Cranial Nerves: Visual acuity is grossly normal. Visual fields are full. Extra-ocular movements intact. No ptosis. Face is symmetric Motor: Tone and bulk are normal. Power is full in both arms and legs. Reflexes are symmetric, no pathologic reflexes present.  Sensory: Intact to light touch Gait: Normal.   Labs: I have reviewed the data as listed    Component Value Date/Time   NA 139 03/18/2019 0616   K 4.2 03/18/2019 0616   CL 102 03/18/2019 0616   CO2 24 03/18/2019 0616   GLUCOSE 137 (H) 03/18/2019 0616   BUN 9 03/18/2019 0616   CREATININE 0.77 03/18/2019 0616   CREATININE 0.83 03/04/2019 1206   CALCIUM 9.3 03/18/2019 0616   PROT 7.2 03/04/2019 1206  ALBUMIN 4.4 06/26/2016 1009   AST 12 03/04/2019 1206   ALT 15 03/04/2019 1206   ALKPHOS 65 06/26/2016 1009   BILITOT 0.9 03/04/2019 1206   GFRNONAA >60 03/18/2019 0616   GFRNONAA 106 03/04/2019 1206   GFRAA >60 03/18/2019 0616   GFRAA 123 03/04/2019 1206   Lab Results  Component Value Date   WBC 6.4 04/26/2019   NEUTROABS 4.2 04/26/2019   HGB 16.2 04/26/2019   HCT 47.3 04/26/2019   MCV 87.3 04/26/2019   PLT 262 04/26/2019    Assessment/Plan Anaplastic astrocytoma of thalamus (HCC) [C71.0]  Mr. Soler is clinically stable, now having completed 2 weeks of IMRT+TMZ.   At this time our recommendation is to continue with intensity modulated radiation therapy with concurrent daily temozolomide, given over 6 weeks.  We counseled him on side effects of Temodar including nausea/vomiting, fatigue, cytopenias, constipation.   Chemotherapy should be held for the following:  ANC less than 1,000  Platelets less than 100,000  LFT or  creatinine greater than 2x ULN  If clinical concerns/contraindications develop  Should limit use of NSAIDs to less than daily if possible for future headaches.  Headache prophyaxis can be added to this regimen.  We appreciate the opportunity to participate in the care of Aaron Espinoza.  He will follow up with [place] during weeks 4 and 6 of radiation with labs for evaluation.  All questions were answered. The patient knows to call the clinic with any problems, questions or concerns. No barriers to learning were detected.  The total time spent in the encounter was 30 minutes and more than 50% was on counseling and review of test results   Ventura Sellers, MD Medical Director of Neuro-Oncology [facility] at [place] 04/26/19 12:39 PM

## 2019-04-27 ENCOUNTER — Ambulatory Visit
Admission: RE | Admit: 2019-04-27 | Discharge: 2019-04-27 | Disposition: A | Discharge status: Home / Self Care | Payer: BC Managed Care – PPO | Source: Ambulatory Visit | Attending: Radiation Oncology | Admitting: Radiation Oncology

## 2019-04-27 ENCOUNTER — Other Ambulatory Visit: Payer: Self-pay

## 2019-04-27 DIAGNOSIS — R279 Unspecified lack of coordination: Secondary | ICD-10-CM | POA: Diagnosis not present

## 2019-04-27 DIAGNOSIS — M6281 Muscle weakness (generalized): Secondary | ICD-10-CM | POA: Diagnosis not present

## 2019-04-27 DIAGNOSIS — Z510 Encounter for antineoplastic radiation therapy: Secondary | ICD-10-CM | POA: Diagnosis not present

## 2019-04-27 DIAGNOSIS — C710 Malignant neoplasm of cerebrum, except lobes and ventricles: Secondary | ICD-10-CM | POA: Diagnosis not present

## 2019-04-28 ENCOUNTER — Other Ambulatory Visit: Payer: Self-pay

## 2019-04-28 ENCOUNTER — Ambulatory Visit
Admission: RE | Admit: 2019-04-28 | Discharge: 2019-04-28 | Disposition: A | Discharge status: Home / Self Care | Payer: BC Managed Care – PPO | Source: Ambulatory Visit | Attending: Radiation Oncology | Admitting: Radiation Oncology

## 2019-04-28 DIAGNOSIS — C710 Malignant neoplasm of cerebrum, except lobes and ventricles: Secondary | ICD-10-CM | POA: Diagnosis not present

## 2019-04-28 DIAGNOSIS — Z510 Encounter for antineoplastic radiation therapy: Secondary | ICD-10-CM | POA: Diagnosis not present

## 2019-04-29 ENCOUNTER — Other Ambulatory Visit: Payer: Self-pay

## 2019-04-29 ENCOUNTER — Ambulatory Visit
Admission: RE | Admit: 2019-04-29 | Discharge: 2019-04-29 | Disposition: A | Discharge status: Home / Self Care | Payer: BC Managed Care – PPO | Source: Ambulatory Visit | Attending: Radiation Oncology | Admitting: Radiation Oncology

## 2019-04-29 ENCOUNTER — Other Ambulatory Visit: Payer: Self-pay | Admitting: Internal Medicine

## 2019-04-29 DIAGNOSIS — C710 Malignant neoplasm of cerebrum, except lobes and ventricles: Secondary | ICD-10-CM | POA: Diagnosis not present

## 2019-04-29 DIAGNOSIS — Z510 Encounter for antineoplastic radiation therapy: Secondary | ICD-10-CM | POA: Diagnosis not present

## 2019-04-30 ENCOUNTER — Other Ambulatory Visit: Payer: Self-pay

## 2019-04-30 ENCOUNTER — Ambulatory Visit
Admission: RE | Admit: 2019-04-30 | Discharge: 2019-04-30 | Disposition: A | Discharge status: Home / Self Care | Payer: BC Managed Care – PPO | Source: Ambulatory Visit | Attending: Radiation Oncology | Admitting: Radiation Oncology

## 2019-04-30 DIAGNOSIS — Z510 Encounter for antineoplastic radiation therapy: Secondary | ICD-10-CM | POA: Diagnosis not present

## 2019-04-30 DIAGNOSIS — C710 Malignant neoplasm of cerebrum, except lobes and ventricles: Secondary | ICD-10-CM | POA: Diagnosis not present

## 2019-04-30 NOTE — Telephone Encounter (Signed)
Rx request 

## 2019-05-03 ENCOUNTER — Ambulatory Visit
Admission: RE | Admit: 2019-05-03 | Discharge: 2019-05-03 | Disposition: A | Discharge status: Home / Self Care | Payer: BC Managed Care – PPO | Source: Ambulatory Visit | Attending: Radiation Oncology | Admitting: Radiation Oncology

## 2019-05-03 ENCOUNTER — Other Ambulatory Visit: Payer: Self-pay

## 2019-05-03 DIAGNOSIS — C710 Malignant neoplasm of cerebrum, except lobes and ventricles: Secondary | ICD-10-CM | POA: Diagnosis not present

## 2019-05-03 DIAGNOSIS — Z510 Encounter for antineoplastic radiation therapy: Secondary | ICD-10-CM | POA: Diagnosis not present

## 2019-05-03 DIAGNOSIS — R279 Unspecified lack of coordination: Secondary | ICD-10-CM | POA: Diagnosis not present

## 2019-05-03 DIAGNOSIS — M6281 Muscle weakness (generalized): Secondary | ICD-10-CM | POA: Diagnosis not present

## 2019-05-04 ENCOUNTER — Ambulatory Visit
Admission: RE | Admit: 2019-05-04 | Discharge: 2019-05-04 | Disposition: A | Discharge status: Home / Self Care | Payer: BC Managed Care – PPO | Source: Ambulatory Visit | Attending: Radiation Oncology | Admitting: Radiation Oncology

## 2019-05-04 ENCOUNTER — Other Ambulatory Visit: Payer: Self-pay

## 2019-05-04 DIAGNOSIS — Z510 Encounter for antineoplastic radiation therapy: Secondary | ICD-10-CM | POA: Diagnosis not present

## 2019-05-04 DIAGNOSIS — C710 Malignant neoplasm of cerebrum, except lobes and ventricles: Secondary | ICD-10-CM | POA: Diagnosis not present

## 2019-05-05 ENCOUNTER — Other Ambulatory Visit: Payer: Self-pay

## 2019-05-05 ENCOUNTER — Ambulatory Visit
Admission: RE | Admit: 2019-05-05 | Discharge: 2019-05-05 | Disposition: A | Discharge status: Home / Self Care | Payer: BC Managed Care – PPO | Source: Ambulatory Visit | Attending: Radiation Oncology | Admitting: Radiation Oncology

## 2019-05-05 DIAGNOSIS — Z510 Encounter for antineoplastic radiation therapy: Secondary | ICD-10-CM | POA: Diagnosis not present

## 2019-05-05 DIAGNOSIS — C710 Malignant neoplasm of cerebrum, except lobes and ventricles: Secondary | ICD-10-CM | POA: Diagnosis not present

## 2019-05-06 ENCOUNTER — Ambulatory Visit
Admission: RE | Admit: 2019-05-06 | Discharge: 2019-05-06 | Disposition: A | Discharge status: Home / Self Care | Payer: BC Managed Care – PPO | Source: Ambulatory Visit | Attending: Radiation Oncology | Admitting: Radiation Oncology

## 2019-05-06 ENCOUNTER — Other Ambulatory Visit: Payer: Self-pay

## 2019-05-06 DIAGNOSIS — Z510 Encounter for antineoplastic radiation therapy: Secondary | ICD-10-CM | POA: Insufficient documentation

## 2019-05-06 DIAGNOSIS — M6281 Muscle weakness (generalized): Secondary | ICD-10-CM | POA: Diagnosis not present

## 2019-05-06 DIAGNOSIS — R279 Unspecified lack of coordination: Secondary | ICD-10-CM | POA: Diagnosis not present

## 2019-05-06 DIAGNOSIS — C710 Malignant neoplasm of cerebrum, except lobes and ventricles: Secondary | ICD-10-CM | POA: Insufficient documentation

## 2019-05-07 ENCOUNTER — Other Ambulatory Visit: Payer: Self-pay

## 2019-05-07 ENCOUNTER — Ambulatory Visit
Admission: RE | Admit: 2019-05-07 | Discharge: 2019-05-07 | Disposition: A | Discharge status: Home / Self Care | Payer: BC Managed Care – PPO | Source: Ambulatory Visit | Attending: Radiation Oncology | Admitting: Radiation Oncology

## 2019-05-07 DIAGNOSIS — C710 Malignant neoplasm of cerebrum, except lobes and ventricles: Secondary | ICD-10-CM | POA: Diagnosis not present

## 2019-05-07 DIAGNOSIS — Z510 Encounter for antineoplastic radiation therapy: Secondary | ICD-10-CM | POA: Diagnosis not present

## 2019-05-10 ENCOUNTER — Inpatient Hospital Stay: Payer: BC Managed Care – PPO | Attending: Internal Medicine | Admitting: Internal Medicine

## 2019-05-10 ENCOUNTER — Ambulatory Visit
Admission: RE | Admit: 2019-05-10 | Discharge: 2019-05-10 | Disposition: A | Discharge status: Home / Self Care | Payer: BC Managed Care – PPO | Source: Ambulatory Visit | Attending: Radiation Oncology | Admitting: Radiation Oncology

## 2019-05-10 ENCOUNTER — Other Ambulatory Visit: Payer: Self-pay

## 2019-05-10 ENCOUNTER — Inpatient Hospital Stay: Payer: BC Managed Care – PPO

## 2019-05-10 VITALS — BP 121/80 | HR 58 | Temp 98.7°F | Resp 18 | Ht 72.0 in | Wt 179.2 lb

## 2019-05-10 DIAGNOSIS — Z79899 Other long term (current) drug therapy: Secondary | ICD-10-CM | POA: Insufficient documentation

## 2019-05-10 DIAGNOSIS — C710 Malignant neoplasm of cerebrum, except lobes and ventricles: Secondary | ICD-10-CM | POA: Diagnosis not present

## 2019-05-10 DIAGNOSIS — Q8500 Neurofibromatosis, unspecified: Secondary | ICD-10-CM | POA: Insufficient documentation

## 2019-05-10 DIAGNOSIS — Z87891 Personal history of nicotine dependence: Secondary | ICD-10-CM | POA: Diagnosis not present

## 2019-05-10 DIAGNOSIS — Z923 Personal history of irradiation: Secondary | ICD-10-CM | POA: Diagnosis not present

## 2019-05-10 DIAGNOSIS — N400 Enlarged prostate without lower urinary tract symptoms: Secondary | ICD-10-CM | POA: Diagnosis not present

## 2019-05-10 DIAGNOSIS — R5383 Other fatigue: Secondary | ICD-10-CM | POA: Diagnosis not present

## 2019-05-10 DIAGNOSIS — M6281 Muscle weakness (generalized): Secondary | ICD-10-CM | POA: Diagnosis not present

## 2019-05-10 DIAGNOSIS — Z9221 Personal history of antineoplastic chemotherapy: Secondary | ICD-10-CM | POA: Diagnosis not present

## 2019-05-10 DIAGNOSIS — R279 Unspecified lack of coordination: Secondary | ICD-10-CM | POA: Diagnosis not present

## 2019-05-10 DIAGNOSIS — Z510 Encounter for antineoplastic radiation therapy: Secondary | ICD-10-CM | POA: Diagnosis not present

## 2019-05-10 LAB — CMP (CANCER CENTER ONLY)
ALT: 19 U/L (ref 0–44)
AST: 14 U/L — ABNORMAL LOW (ref 15–41)
Albumin: 4.2 g/dL (ref 3.5–5.0)
Alkaline Phosphatase: 63 U/L (ref 38–126)
Anion gap: 11 (ref 5–15)
BUN: 11 mg/dL (ref 6–20)
CO2: 27 mmol/L (ref 22–32)
Calcium: 9.8 mg/dL (ref 8.9–10.3)
Chloride: 101 mmol/L (ref 98–111)
Creatinine: 0.93 mg/dL (ref 0.61–1.24)
GFR, Est AFR Am: >60 mL/min (ref >60)
GFR, Estimated: >60 mL/min (ref >60)
Glucose, Bld: 97 mg/dL (ref 70–99)
Potassium: 4.6 mmol/L (ref 3.5–5.1)
Sodium: 139 mmol/L (ref 135–145)
Total Bilirubin: 1 mg/dL (ref 0.3–1.2)
Total Protein: 7.6 g/dL (ref 6.5–8.1)

## 2019-05-10 LAB — CBC WITH DIFFERENTIAL (CANCER CENTER ONLY)
Abs Immature Granulocytes: 0.05 10*3/uL (ref 0.00–0.07)
Basophils Absolute: 0 10*3/uL (ref 0.0–0.1)
Basophils Relative: 1 %
Eosinophils Absolute: 0.1 10*3/uL (ref 0.0–0.5)
Eosinophils Relative: 2 %
HCT: 47.3 % (ref 39.0–52.0)
Hemoglobin: 16.3 g/dL (ref 13.0–17.0)
Immature Granulocytes: 1 %
Lymphocytes Relative: 17 %
Lymphs Abs: 1 10*3/uL (ref 0.7–4.0)
MCH: 29.6 pg (ref 26.0–34.0)
MCHC: 34.5 g/dL (ref 30.0–36.0)
MCV: 85.8 fL (ref 80.0–100.0)
Monocytes Absolute: 0.7 10*3/uL (ref 0.1–1.0)
Monocytes Relative: 13 %
Neutro Abs: 3.9 10*3/uL (ref 1.7–7.7)
Neutrophils Relative %: 66 %
Platelet Count: 293 10*3/uL (ref 150–400)
RBC: 5.51 MIL/uL (ref 4.22–5.81)
RDW: 12.2 % (ref 11.5–15.5)
WBC Count: 5.8 10*3/uL (ref 4.0–10.5)
nRBC: 0 % (ref 0.0–0.2)

## 2019-05-10 NOTE — Progress Notes (Signed)
[place] at [place], [place] [address] [phone]   Interval Evaluation  Date of Service: 05/10/19 Patient Name: Aaron Espinoza Patient MRN: [id number] Patient DOB: February 18, 1975 Provider: Ventura Sellers, MD  Identifying Statement:  Aaron Espinoza is a 44 y.o. male with right thalamic anaplastic astrocytoma   Oncologic History: Oncology History  Anaplastic astrocytoma of thalamus ([place])  03/17/2019 Surgery   Stereotactic biopsy by Dr. Marcello Moores; path demonstrates anaplastic astrocytoma   04/01/2019 -  Chemotherapy   The patient had [No matching medication found in this treatment plan]  for chemotherapy treatment.      Biomarkers:  MGMT Unknown.  IDH 1/2 Wild type.  EGFR Unknown  TERT Unknown   Interval History:  Aaron Espinoza presents to clinic for follow up now having completed 4 weeks of IMRT and concurrent Temodar.  No issues with radiation or chemotherapy.  He continues describe near daily headaches, tension type.  He has been dosing Tylenol with good efficacy, although no improvement in frequency.  Maintains full functional independence.  H+P (03/30/19) Patient presented to medical attention in late January, when he noticed mild confusion, gait imbalance.  These symptoms worsened over the next ~10 days, and were accompanied by incontinence.  Brain MRI was performed and demonstrated a right thalamic mass consistent with likely primary brain tumor.  He underwent stereotactic biopsy and ventriculostomy on 03/17/19 with Dr. Marcello Moores.  Following surgery he describes considerable improvement in most of his symptoms; at this time he is near baseline function but "very fatigued" overall.  No seizures or headaches since surgery.  Medications: Current Outpatient Medications on File Prior to Visit  Medication Sig Dispense Refill  . acetaminophen (TYLENOL) 500 MG tablet Take 1,000 mg by mouth every 6 (six) hours as needed.    .  docusate sodium (COLACE) 100 MG capsule Take 100 mg by mouth daily.    . ondansetron (ZOFRAN) 8 MG tablet Take 1 tablet (8 mg total) by mouth 2 (two) times daily as needed (nausea and vomiting). May take 30-60 minutes prior to Temodar administration if nausea/vomiting occurs. 30 tablet 1  . polyethylene glycol (MIRALAX / GLYCOLAX) 17 g packet Take 1 packet by mouth daily.    Marland Kitchen temozolomide (TEMODAR) 140 MG capsule TAKE 1 CAPSULE (140 MG TOTAL) BY MOUTH DAILY EVERYDAY WHILE ON RADIATION THERAPY . MAY TAKE ON AN EMPTY STOMACH TO DECREASE NAUSEA & VOMG. 30 capsule 0   No current facility-administered medications on file prior to visit.    Allergies: No Known Allergies Past Medical History:  Past Medical History:  Diagnosis Date  . BPH (benign prostatic hyperplasia) 07/20/2015  . Brain mass   . Cognitive dysfunction 01/2019  . Fatigue 01/2019  . Headache   . NF (neurofibromatosis) ([place]) 07/20/2015   NF1   Past Surgical History:  Past Surgical History:  Procedure Laterality Date  . HAND SURGERY Left    pinky - tumor benign  . LEG SURGERY Left    inner thigh tumor - benign  . VENTRICULOSTOMY N/A 03/17/2019   Procedure: ENDOSCOPIC THIRD VENTRICULOSTOMY;  Surgeon: Vallarie Mare, MD;  Location: [place];  Service: Neurosurgery;  Laterality: N/A;  ENDOSCOPIC THIRD VENTRICULOSTOMY   Social History:  Social History   Socioeconomic History  . Marital status: Married    Spouse name: Not on file  . Number of children: Not on file  . Years of education: Not on file  . Highest education level:  Not on file  Occupational History  . Not on file  Tobacco Use  . Smoking status: Former Smoker    Types: Cigars, Cigarettes  . Smokeless tobacco: Never Used  . Tobacco comment: Quit smoking cigarettes 25 yrs ago, quit cigars in 2020  Substance and Sexual Activity  . Alcohol use: Yes    Alcohol/week: 6.0 - 9.0 standard drinks    Types: 6 - 9 Standard drinks or equivalent per week  . Drug use: No   . Sexual activity: Not on file  Other Topics Concern  . Not on file  Social History Narrative  . Not on file   Social Determinants of Health   Financial Resource Strain:   . Difficulty of Paying Living Expenses:   Food Insecurity:   . Worried About Charity fundraiser in the Last Year:   . Arboriculturist in the Last Year:   Transportation Needs:   . Film/video editor (Medical):   Marland Kitchen Lack of Transportation (Non-Medical):   Physical Activity:   . Days of Exercise per Week:   . Minutes of Exercise per Session:   Stress:   . Feeling of Stress :   Social Connections:   . Frequency of Communication with Friends and Family:   . Frequency of Social Gatherings with Friends and Family:   . Attends Religious Services:   . Active Member of Clubs or Organizations:   . Attends Archivist Meetings:   Marland Kitchen Marital Status:   Intimate Partner Violence:   . Fear of Current or Ex-Partner:   . Emotionally Abused:   Marland Kitchen Physically Abused:   . Sexually Abused:    Family History:  Family History  Problem Relation Age of Onset  . Cancer Paternal Grandfather        Prostate    Review of Systems: Constitutional: Doesn't report fevers, chills or abnormal weight loss Eyes: Doesn't report blurriness of vision Ears, nose, mouth, throat, and face: Doesn't report sore throat Respiratory: Doesn't report cough, dyspnea or wheezes Cardiovascular: Doesn't report palpitation, chest discomfort  Gastrointestinal:  Doesn't report nausea, constipation, diarrhea GU: Doesn't report incontinence Skin: Doesn't report skin rashes Neurological: Per HPI Musculoskeletal: Doesn't report joint pain Behavioral/Psych: Doesn't report anxiety  Physical Exam: There were no vitals filed for this visit. KPS: 90. General: Alert, cooperative, pleasant, in no acute distress Head: Normal EENT: No conjunctival injection or scleral icterus.  Lungs: Resp effort normal Cardiac: Regular rate Abdomen:  Non-distended abdomen Skin: No rashes cyanosis or petechiae. Extremities: No clubbing or edema  Neurologic Exam: Mental Status: Awake, alert, attentive to examiner. Oriented to self and environment. Language is fluent with intact comprehension.  Cranial Nerves: Visual acuity is grossly normal. Visual fields are full. Extra-ocular movements intact. No ptosis. Face is symmetric Motor: Tone and bulk are normal. Power is full in both arms and legs. Reflexes are symmetric, no pathologic reflexes present.  Sensory: Intact to light touch Gait: Normal.   Labs: I have reviewed the data as listed    Component Value Date/Time   NA 140 04/26/2019 1208   K 4.7 04/26/2019 1208   CL 103 04/26/2019 1208   CO2 29 04/26/2019 1208   GLUCOSE 81 04/26/2019 1208   BUN 11 04/26/2019 1208   CREATININE 0.86 04/26/2019 1208   CREATININE 0.83 03/04/2019 1206   CALCIUM 9.9 04/26/2019 1208   PROT 7.1 04/26/2019 1208   ALBUMIN 4.1 04/26/2019 1208   AST 18 04/26/2019 1208   ALT 39  04/26/2019 1208   ALKPHOS 68 04/26/2019 1208   BILITOT 0.7 04/26/2019 1208   GFRNONAA >60 04/26/2019 1208   GFRNONAA 106 03/04/2019 1206   GFRAA >60 04/26/2019 1208   GFRAA 123 03/04/2019 1206   Lab Results  Component Value Date   WBC 6.4 04/26/2019   NEUTROABS 4.2 04/26/2019   HGB 16.2 04/26/2019   HCT 47.3 04/26/2019   MCV 87.3 04/26/2019   PLT 262 04/26/2019    Assessment/Plan Anaplastic astrocytoma of thalamus (HCC) [C71.0]  Mr. Baena is clinically stable, now having completed 4 weeks of IMRT+TMZ.   At this time our recommendation is to continue with intensity modulated radiation therapy with concurrent daily temozolomide 104m/m2, given over 6 weeks.  We counseled him on side effects of Temodar including nausea/vomiting, fatigue, cytopenias, constipation.   Chemotherapy should be held for the following:  ANC less than 1,000  Platelets less than 100,000  LFT or creatinine greater than 2x ULN  If clinical  concerns/contraindications develop  Con't Tylelonel and PRN NSAIDs less than daily if possible for breakthrough headaches.  Will consider initiating amytryptilline if symptoms worsen.  We appreciate the opportunity to participate in the care of Aaron Espinoza  He will follow up with [place] week 6 of radiation with labs for evaluation.  All questions were answered. The patient knows to call the clinic with any problems, questions or concerns. No barriers to learning were detected.  The total time spent in the encounter was 30 minutes and more than 50% was on counseling and review of test results   ZVentura Sellers MD Medical Director of Neuro-Oncology [facility] [place]04/05/21 10:43 AM

## 2019-05-11 ENCOUNTER — Other Ambulatory Visit: Payer: Self-pay

## 2019-05-11 ENCOUNTER — Ambulatory Visit
Admission: RE | Admit: 2019-05-11 | Discharge: 2019-05-11 | Disposition: A | Discharge status: Home / Self Care | Payer: BC Managed Care – PPO | Source: Ambulatory Visit | Attending: Radiation Oncology | Admitting: Radiation Oncology

## 2019-05-11 DIAGNOSIS — C710 Malignant neoplasm of cerebrum, except lobes and ventricles: Secondary | ICD-10-CM | POA: Diagnosis not present

## 2019-05-11 DIAGNOSIS — Z510 Encounter for antineoplastic radiation therapy: Secondary | ICD-10-CM | POA: Diagnosis not present

## 2019-05-12 ENCOUNTER — Other Ambulatory Visit: Payer: Self-pay

## 2019-05-12 ENCOUNTER — Ambulatory Visit
Admission: RE | Admit: 2019-05-12 | Discharge: 2019-05-12 | Disposition: A | Discharge status: Home / Self Care | Payer: BC Managed Care – PPO | Source: Ambulatory Visit | Attending: Radiation Oncology | Admitting: Radiation Oncology

## 2019-05-12 DIAGNOSIS — Z510 Encounter for antineoplastic radiation therapy: Secondary | ICD-10-CM | POA: Diagnosis not present

## 2019-05-12 DIAGNOSIS — R279 Unspecified lack of coordination: Secondary | ICD-10-CM | POA: Diagnosis not present

## 2019-05-12 DIAGNOSIS — C710 Malignant neoplasm of cerebrum, except lobes and ventricles: Secondary | ICD-10-CM | POA: Diagnosis not present

## 2019-05-12 DIAGNOSIS — M6281 Muscle weakness (generalized): Secondary | ICD-10-CM | POA: Diagnosis not present

## 2019-05-13 ENCOUNTER — Ambulatory Visit
Admission: RE | Admit: 2019-05-13 | Discharge: 2019-05-13 | Disposition: A | Discharge status: Home / Self Care | Payer: BC Managed Care – PPO | Source: Ambulatory Visit | Attending: Radiation Oncology | Admitting: Radiation Oncology

## 2019-05-13 ENCOUNTER — Other Ambulatory Visit: Payer: Self-pay

## 2019-05-13 DIAGNOSIS — Z510 Encounter for antineoplastic radiation therapy: Secondary | ICD-10-CM | POA: Diagnosis not present

## 2019-05-13 DIAGNOSIS — C710 Malignant neoplasm of cerebrum, except lobes and ventricles: Secondary | ICD-10-CM | POA: Diagnosis not present

## 2019-05-14 ENCOUNTER — Other Ambulatory Visit: Payer: Self-pay

## 2019-05-14 ENCOUNTER — Ambulatory Visit
Admission: RE | Admit: 2019-05-14 | Discharge: 2019-05-14 | Disposition: A | Discharge status: Home / Self Care | Payer: BC Managed Care – PPO | Source: Ambulatory Visit | Attending: Radiation Oncology | Admitting: Radiation Oncology

## 2019-05-14 DIAGNOSIS — Z510 Encounter for antineoplastic radiation therapy: Secondary | ICD-10-CM | POA: Diagnosis not present

## 2019-05-14 DIAGNOSIS — C710 Malignant neoplasm of cerebrum, except lobes and ventricles: Secondary | ICD-10-CM | POA: Diagnosis not present

## 2019-05-17 ENCOUNTER — Other Ambulatory Visit: Payer: Self-pay

## 2019-05-17 ENCOUNTER — Ambulatory Visit
Admission: RE | Admit: 2019-05-17 | Discharge: 2019-05-17 | Disposition: A | Discharge status: Home / Self Care | Payer: BC Managed Care – PPO | Source: Ambulatory Visit | Attending: Radiation Oncology | Admitting: Radiation Oncology

## 2019-05-17 DIAGNOSIS — M6281 Muscle weakness (generalized): Secondary | ICD-10-CM | POA: Diagnosis not present

## 2019-05-17 DIAGNOSIS — R279 Unspecified lack of coordination: Secondary | ICD-10-CM | POA: Diagnosis not present

## 2019-05-17 DIAGNOSIS — Z510 Encounter for antineoplastic radiation therapy: Secondary | ICD-10-CM | POA: Diagnosis not present

## 2019-05-17 DIAGNOSIS — C710 Malignant neoplasm of cerebrum, except lobes and ventricles: Secondary | ICD-10-CM | POA: Diagnosis not present

## 2019-05-18 ENCOUNTER — Ambulatory Visit
Admission: RE | Admit: 2019-05-18 | Discharge: 2019-05-18 | Disposition: A | Discharge status: Home / Self Care | Payer: BC Managed Care – PPO | Source: Ambulatory Visit | Attending: Radiation Oncology | Admitting: Radiation Oncology

## 2019-05-18 ENCOUNTER — Other Ambulatory Visit: Payer: Self-pay

## 2019-05-18 DIAGNOSIS — C710 Malignant neoplasm of cerebrum, except lobes and ventricles: Secondary | ICD-10-CM | POA: Diagnosis not present

## 2019-05-18 DIAGNOSIS — Z510 Encounter for antineoplastic radiation therapy: Secondary | ICD-10-CM | POA: Diagnosis not present

## 2019-05-19 ENCOUNTER — Other Ambulatory Visit: Payer: Self-pay

## 2019-05-19 ENCOUNTER — Ambulatory Visit
Admission: RE | Admit: 2019-05-19 | Discharge: 2019-05-19 | Disposition: A | Discharge status: Home / Self Care | Payer: BC Managed Care – PPO | Source: Ambulatory Visit | Attending: Radiation Oncology | Admitting: Radiation Oncology

## 2019-05-19 DIAGNOSIS — Z510 Encounter for antineoplastic radiation therapy: Secondary | ICD-10-CM | POA: Diagnosis not present

## 2019-05-19 DIAGNOSIS — C710 Malignant neoplasm of cerebrum, except lobes and ventricles: Secondary | ICD-10-CM | POA: Diagnosis not present

## 2019-05-20 ENCOUNTER — Inpatient Hospital Stay: Payer: BC Managed Care – PPO

## 2019-05-20 ENCOUNTER — Telehealth: Payer: Self-pay | Admitting: Internal Medicine

## 2019-05-20 ENCOUNTER — Ambulatory Visit
Admission: RE | Admit: 2019-05-20 | Discharge: 2019-05-20 | Disposition: A | Discharge status: Home / Self Care | Payer: BC Managed Care – PPO | Source: Ambulatory Visit | Attending: Radiation Oncology | Admitting: Radiation Oncology

## 2019-05-20 ENCOUNTER — Inpatient Hospital Stay (HOSPITAL_BASED_OUTPATIENT_CLINIC_OR_DEPARTMENT_OTHER): Payer: BC Managed Care – PPO | Admitting: Internal Medicine

## 2019-05-20 ENCOUNTER — Other Ambulatory Visit: Payer: Self-pay

## 2019-05-20 VITALS — BP 135/77 | HR 60 | Temp 98.7°F | Resp 17 | Ht 72.0 in | Wt 181.4 lb

## 2019-05-20 DIAGNOSIS — R5383 Other fatigue: Secondary | ICD-10-CM | POA: Diagnosis not present

## 2019-05-20 DIAGNOSIS — C710 Malignant neoplasm of cerebrum, except lobes and ventricles: Secondary | ICD-10-CM | POA: Diagnosis not present

## 2019-05-20 DIAGNOSIS — Z79899 Other long term (current) drug therapy: Secondary | ICD-10-CM | POA: Diagnosis not present

## 2019-05-20 DIAGNOSIS — Z510 Encounter for antineoplastic radiation therapy: Secondary | ICD-10-CM | POA: Diagnosis not present

## 2019-05-20 DIAGNOSIS — Q8500 Neurofibromatosis, unspecified: Secondary | ICD-10-CM | POA: Diagnosis not present

## 2019-05-20 DIAGNOSIS — Z923 Personal history of irradiation: Secondary | ICD-10-CM | POA: Diagnosis not present

## 2019-05-20 DIAGNOSIS — Z9221 Personal history of antineoplastic chemotherapy: Secondary | ICD-10-CM | POA: Diagnosis not present

## 2019-05-20 DIAGNOSIS — Z87891 Personal history of nicotine dependence: Secondary | ICD-10-CM | POA: Diagnosis not present

## 2019-05-20 DIAGNOSIS — N400 Enlarged prostate without lower urinary tract symptoms: Secondary | ICD-10-CM | POA: Diagnosis not present

## 2019-05-20 LAB — CMP (CANCER CENTER ONLY)
ALT: 15 U/L (ref 0–44)
AST: 12 U/L — ABNORMAL LOW (ref 15–41)
Albumin: 3.9 g/dL (ref 3.5–5.0)
Alkaline Phosphatase: 63 U/L (ref 38–126)
Anion gap: 9 (ref 5–15)
BUN: 11 mg/dL (ref 6–20)
CO2: 28 mmol/L (ref 22–32)
Calcium: 9.5 mg/dL (ref 8.9–10.3)
Chloride: 104 mmol/L (ref 98–111)
Creatinine: 0.87 mg/dL (ref 0.61–1.24)
GFR, Est AFR Am: >60 mL/min (ref >60)
GFR, Estimated: >60 mL/min (ref >60)
Glucose, Bld: 71 mg/dL (ref 70–99)
Potassium: 4.4 mmol/L (ref 3.5–5.1)
Sodium: 141 mmol/L (ref 135–145)
Total Bilirubin: 0.9 mg/dL (ref 0.3–1.2)
Total Protein: 7 g/dL (ref 6.5–8.1)

## 2019-05-20 LAB — CBC WITH DIFFERENTIAL (CANCER CENTER ONLY)
Abs Immature Granulocytes: 0.02 10*3/uL (ref 0.00–0.07)
Basophils Absolute: 0 10*3/uL (ref 0.0–0.1)
Basophils Relative: 1 %
Eosinophils Absolute: 0.2 10*3/uL (ref 0.0–0.5)
Eosinophils Relative: 4 %
HCT: 46 % (ref 39.0–52.0)
Hemoglobin: 15.7 g/dL (ref 13.0–17.0)
Immature Granulocytes: 0 %
Lymphocytes Relative: 16 %
Lymphs Abs: 0.9 10*3/uL (ref 0.7–4.0)
MCH: 29.9 pg (ref 26.0–34.0)
MCHC: 34.1 g/dL (ref 30.0–36.0)
MCV: 87.6 fL (ref 80.0–100.0)
Monocytes Absolute: 0.6 10*3/uL (ref 0.1–1.0)
Monocytes Relative: 11 %
Neutro Abs: 3.9 10*3/uL (ref 1.7–7.7)
Neutrophils Relative %: 68 %
Platelet Count: 259 10*3/uL (ref 150–400)
RBC: 5.25 MIL/uL (ref 4.22–5.81)
RDW: 12.3 % (ref 11.5–15.5)
WBC Count: 5.7 10*3/uL (ref 4.0–10.5)
nRBC: 0 % (ref 0.0–0.2)

## 2019-05-20 NOTE — Progress Notes (Signed)
[facility] [place], [place] [address] [phone]   Interval Evaluation  Date of Service: 05/20/19 Patient Name: Aaron Espinoza Patient MRN: [id number] Patient DOB: 1975-06-04 Provider: Ventura Sellers, MD  Identifying Statement:  MCKINNON GLICK is a 44 y.o. male with right thalamic anaplastic astrocytoma   Oncologic History: Oncology History  Anaplastic astrocytoma of thalamus ([place])  03/17/2019 Surgery   Stereotactic biopsy by Dr. Marcello Moores; path demonstrates anaplastic astrocytoma   04/12/2019 - 05/21/2019 Radiation Therapy   IMRT with concurrent Temodar 110m/m2 daily     Biomarkers:  MGMT Unknown.  IDH 1/2 Wild type.  EGFR Unknown  TERT Unknown   Interval History:  JDAMARRION MIMBSpresents to clinic for follow up now in final week of IMRT and concurrent Temodar.  Fatigue has very clearly increased, he is exhausted just getting dressed in the AM.  Requiring help at home because of energy issues.  No other issues with radiation or chemotherapy.  He describes some improvement in headache frequency, tension type.  He has been dosing Ibuprofen with good efficacy these past two weeks.  Maintains full functional independence.  H+P (03/30/19) Patient presented to medical attention in late January, when he noticed mild confusion, gait imbalance.  These symptoms worsened over the next ~10 days, and were accompanied by incontinence.  Brain MRI was performed and demonstrated a right thalamic mass consistent with likely primary brain tumor.  He underwent stereotactic biopsy and ventriculostomy on 03/17/19 with Dr. TMarcello Moores  Following surgery he describes considerable improvement in most of his symptoms; at this time he is near baseline function but "very fatigued" overall.  No seizures or headaches since surgery.  Medications: Current Outpatient Medications on File Prior to Visit  Medication Sig Dispense Refill  . acetaminophen  (TYLENOL) 500 MG tablet Take 1,000 mg by mouth every 6 (six) hours as needed.    . docusate sodium (COLACE) 100 MG capsule Take 100 mg by mouth daily.    . ondansetron (ZOFRAN) 8 MG tablet Take 1 tablet (8 mg total) by mouth 2 (two) times daily as needed (nausea and vomiting). May take 30-60 minutes prior to Temodar administration if nausea/vomiting occurs. 30 tablet 1  . polyethylene glycol (MIRALAX / GLYCOLAX) 17 g packet Take 1 packet by mouth daily.    .Marland Kitchentemozolomide (TEMODAR) 140 MG capsule TAKE 1 CAPSULE (140 MG TOTAL) BY MOUTH DAILY EVERYDAY WHILE ON RADIATION THERAPY . MAY TAKE ON AN EMPTY STOMACH TO DECREASE NAUSEA & VOMG. 30 capsule 0   No current facility-administered medications on file prior to visit.    Allergies: No Known Allergies Past Medical History:  Past Medical History:  Diagnosis Date  . BPH (benign prostatic hyperplasia) 07/20/2015  . Brain mass   . Cognitive dysfunction 01/2019  . Fatigue 01/2019  . Headache   . [place]   NF1   Past Surgical History:  Past Surgical History:  Procedure Laterality Date  . HAND SURGERY Left    pinky - tumor benign  . LEG SURGERY Left    inner thigh tumor - benign  . VENTRICULOSTOMY N/A 03/17/2019   Procedure: ENDOSCOPIC THIRD VENTRICULOSTOMY;  Surgeon: TVallarie Mare MD;  [facility]  Service: Neurosurgery;  Laterality: N/A;  ENDOSCOPIC THIRD VENTRICULOSTOMY   Social History:  Social History   Socioeconomic History  . Marital status: Married    Spouse name: Not on file  . Number of children: Not  on file  . Years of education: Not on file  . Highest education level: Not on file  Occupational History  . Not on file  Tobacco Use  . Smoking status: Former Smoker    Types: Cigars, Cigarettes  . Smokeless tobacco: Never Used  . Tobacco comment: Quit smoking cigarettes 25 yrs ago, quit cigars in 2020  Substance and Sexual Activity  . Alcohol use: Yes    Alcohol/week: 6.0 - 9.0  standard drinks    Types: 6 - 9 Standard drinks or equivalent per week  . Drug use: No  . Sexual activity: Not on file  Other Topics Concern  . Not on file  Social History Narrative  . Not on file   Social Determinants of Health   Financial Resource Strain:   . Difficulty of Paying Living Expenses:   Food Insecurity:   . Worried About Charity fundraiser in the Last Year:   . Arboriculturist in the Last Year:   Transportation Needs:   . Film/video editor (Medical):   Marland Kitchen Lack of Transportation (Non-Medical):   Physical Activity:   . Days of Exercise per Week:   . Minutes of Exercise per Session:   Stress:   . Feeling of Stress :   Social Connections:   . Frequency of Communication with Friends and Family:   . Frequency of Social Gatherings with Friends and Family:   . Attends Religious Services:   . Active Member of Clubs or Organizations:   . Attends Archivist Meetings:   Marland Kitchen Marital Status:   Intimate Partner Violence:   . Fear of Current or Ex-Partner:   . Emotionally Abused:   Marland Kitchen Physically Abused:   . Sexually Abused:    Family History:  Family History  Problem Relation Age of Onset  . Cancer Paternal Grandfather        Prostate    Review of Systems: Constitutional: Doesn't report fevers, chills or abnormal weight loss Eyes: Doesn't report blurriness of vision Ears, nose, mouth, throat, and face: Doesn't report sore throat Respiratory: Doesn't report cough, dyspnea or wheezes Cardiovascular: Doesn't report palpitation, chest discomfort  Gastrointestinal:  Doesn't report nausea, constipation, diarrhea GU: Doesn't report incontinence Skin: Doesn't report skin rashes Neurological: Per HPI Musculoskeletal: Doesn't report joint pain Behavioral/Psych: Doesn't report anxiety  Physical Exam: Vitals:   05/20/19 1047  BP: 135/77  Pulse: 60  Resp: 17  Temp: 98.7 F (37.1 C)  SpO2: 100%   KPS: 90. General: ++fatigue Head: Normal EENT: No  conjunctival injection or scleral icterus.  Lungs: Resp effort normal Cardiac: Regular rate Abdomen: Non-distended abdomen Skin: No rashes cyanosis or petechiae. Extremities: No clubbing or edema  Neurologic Exam: Mental Status: Awake, alert, attentive to examiner. Oriented to self and environment. Language is fluent with intact comprehension.  Cranial Nerves: Visual acuity is grossly normal. Visual fields are full. Extra-ocular movements intact. No ptosis. Face is symmetric Motor: Tone and bulk are normal. Power is full in both arms and legs. Reflexes are symmetric, no pathologic reflexes present.  Sensory: Intact to light touch Gait: Normal.   Labs: I have reviewed the data as listed    Component Value Date/Time   NA 139 05/10/2019 1034   K 4.6 05/10/2019 1034   CL 101 05/10/2019 1034   CO2 27 05/10/2019 1034   GLUCOSE 97 05/10/2019 1034   BUN 11 05/10/2019 1034   CREATININE 0.93 05/10/2019 1034   CREATININE 0.83 03/04/2019 1206  CALCIUM 9.8 05/10/2019 1034   PROT 7.6 05/10/2019 1034   ALBUMIN 4.2 05/10/2019 1034   AST 14 (L) 05/10/2019 1034   ALT 19 05/10/2019 1034   ALKPHOS 63 05/10/2019 1034   BILITOT 1.0 05/10/2019 1034   GFRNONAA >60 05/10/2019 1034   GFRNONAA 106 03/04/2019 1206   GFRAA >60 05/10/2019 1034   GFRAA 123 03/04/2019 1206   Lab Results  Component Value Date   WBC 5.8 05/10/2019   NEUTROABS 3.9 05/10/2019   HGB 16.3 05/10/2019   HCT 47.3 05/10/2019   MCV 85.8 05/10/2019   PLT 293 05/10/2019    Assessment/Plan Anaplastic astrocytoma of thalamus ([facility]) [C71.0]  Mr. Francis is clinically stable, now in final week of IMRT+TMZ.  Increase in fatigue is consistent with degree of radiation exposure and will not require further workup at this time.  At this time our recommendation is to continue with intensity modulated radiation therapy with concurrent daily temozolomide 17m/m2, given over 6 weeks.  We counseled him on side effects of Temodar  including nausea/vomiting, fatigue, cytopenias, constipation.   Chemotherapy should be held for the following:  ANC less than 1,000  Platelets less than 100,000  LFT or creatinine greater than 2x ULN  If clinical concerns/contraindications develop  Con't Tylelonel and PRN NSAIDs less than daily if possible for breakthrough headaches.    We appreciate the opportunity to participate in the care of JKAIVON LIVESEY  He will follow up with MRI brain in 3-4 weeks following completion of RT.  All questions were answered. The patient knows to call the clinic with any problems, questions or concerns. No barriers to learning were detected.  The total time spent in the encounter was 30 minutes and more than 50% was on counseling and review of test results   ZVentura Sellers MD Medical Director of Neuro-Oncology [facility] [place]04/15/21 10:41 AM

## 2019-05-20 NOTE — Telephone Encounter (Signed)
Scheduled appt per 4/15 los.  Spoke with pt and he is aware of the appt date and time.

## 2019-05-21 ENCOUNTER — Ambulatory Visit
Admission: RE | Admit: 2019-05-21 | Discharge: 2019-05-21 | Disposition: A | Discharge status: Home / Self Care | Payer: BC Managed Care – PPO | Source: Ambulatory Visit | Attending: Radiation Oncology | Admitting: Radiation Oncology

## 2019-05-21 ENCOUNTER — Encounter: Payer: Self-pay | Admitting: Radiation Oncology

## 2019-05-21 ENCOUNTER — Inpatient Hospital Stay: Payer: BC Managed Care – PPO

## 2019-05-21 ENCOUNTER — Inpatient Hospital Stay: Payer: BC Managed Care – PPO | Admitting: Internal Medicine

## 2019-05-21 DIAGNOSIS — Z510 Encounter for antineoplastic radiation therapy: Secondary | ICD-10-CM | POA: Diagnosis not present

## 2019-05-21 DIAGNOSIS — C710 Malignant neoplasm of cerebrum, except lobes and ventricles: Secondary | ICD-10-CM | POA: Diagnosis not present

## 2019-05-26 DIAGNOSIS — R279 Unspecified lack of coordination: Secondary | ICD-10-CM | POA: Diagnosis not present

## 2019-05-26 DIAGNOSIS — M6281 Muscle weakness (generalized): Secondary | ICD-10-CM | POA: Diagnosis not present

## 2019-05-31 ENCOUNTER — Other Ambulatory Visit: Payer: Self-pay | Admitting: Radiation Therapy

## 2019-05-31 DIAGNOSIS — M6281 Muscle weakness (generalized): Secondary | ICD-10-CM | POA: Diagnosis not present

## 2019-05-31 DIAGNOSIS — R279 Unspecified lack of coordination: Secondary | ICD-10-CM | POA: Diagnosis not present

## 2019-06-03 DIAGNOSIS — M6281 Muscle weakness (generalized): Secondary | ICD-10-CM | POA: Diagnosis not present

## 2019-06-03 DIAGNOSIS — R279 Unspecified lack of coordination: Secondary | ICD-10-CM | POA: Diagnosis not present

## 2019-06-07 DIAGNOSIS — M6281 Muscle weakness (generalized): Secondary | ICD-10-CM | POA: Diagnosis not present

## 2019-06-07 DIAGNOSIS — R279 Unspecified lack of coordination: Secondary | ICD-10-CM | POA: Diagnosis not present

## 2019-06-09 DIAGNOSIS — R279 Unspecified lack of coordination: Secondary | ICD-10-CM | POA: Diagnosis not present

## 2019-06-09 DIAGNOSIS — M6281 Muscle weakness (generalized): Secondary | ICD-10-CM | POA: Diagnosis not present

## 2019-06-10 ENCOUNTER — Telehealth: Payer: Self-pay | Admitting: Radiation Oncology

## 2019-06-10 NOTE — Telephone Encounter (Signed)
  Radiation Oncology         [phone] ________________________________  Name: Aaron Espinoza MRN: [id number]  Date of Service: 06/17/19 DOB: 05-18-1975  Post Treatment Telephone Note  Diagnosis:  Grade 3 Anaplastic Astrocytoma of the right thalamus  Interval Since Last Radiation:  4 weeks   04/12/19-05/21/19: The right thalamic target received 46 Gy in 23 fractions followed by a 14 Gy boost in 7 fractions.  Narrative:  The patient was contacted today for routine follow-up. During treatment he did very well with radiotherapy and did not have significant desquamation.   Impression/Plan: 1. 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 The patient has been doing well since completion of radiotherapy per Dr. Renda Rolls notes. I encouraged him to return my call as I left a voicemail to check on the patient. Otherwise we will follow along expectantly with his course when he's discussed in brain oncology conferences.     Carola Rhine, PAC

## 2019-06-11 ENCOUNTER — Ambulatory Visit
Admission: RE | Admit: 2019-06-11 | Discharge: 2019-06-11 | Disposition: A | Discharge status: Home / Self Care | Payer: BC Managed Care – PPO | Source: Ambulatory Visit | Attending: Internal Medicine | Admitting: Internal Medicine

## 2019-06-11 ENCOUNTER — Other Ambulatory Visit: Payer: Self-pay

## 2019-06-11 DIAGNOSIS — C719 Malignant neoplasm of brain, unspecified: Secondary | ICD-10-CM | POA: Diagnosis not present

## 2019-06-11 DIAGNOSIS — C710 Malignant neoplasm of cerebrum, except lobes and ventricles: Secondary | ICD-10-CM

## 2019-06-11 IMAGING — MR MR HEAD WO/W CM
9 series · 48 of 48 positions shown · IV contrast (17cc Multihance)
Comparison: [DATE]

CLINICAL DATA: Brain neoplasm follow-up. Anaplastic astrocytoma
with interval radiation therapy.

EXAM:
MRI HEAD WITHOUT AND WITH CONTRAST
TECHNIQUE: Multiplanar, multiecho pulse sequences of the brain and surrounding
structures were obtained without and with intravenous contrast.
CONTRAST:  17mL MULTIHANCE GADOBENATE DIMEGLUMINE 529 MG/ML IV SOLN

[Series 2: t1_se_sag · sagittal · 5.0mm · 0.47mm/px · 2 of 19 slices shown (1 of 2)]
[im 1/19]
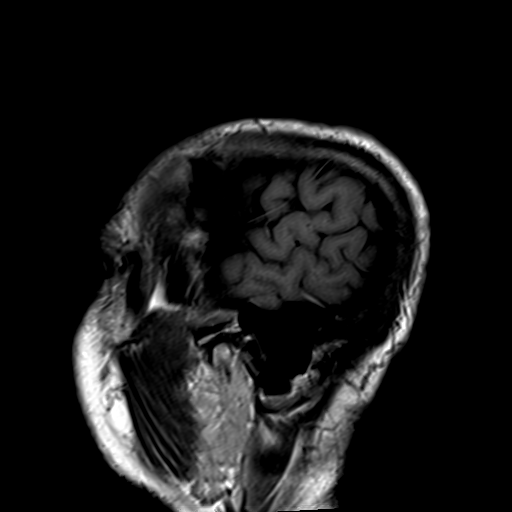
[im 19/19]
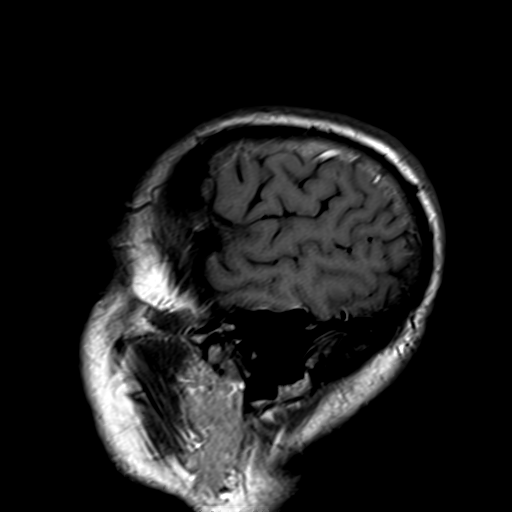

[Series 3: ep2d_diff_ · axial · 3.0mm · 1.80mm/px · z∈[-32,+117]mm · 9 of 102 slices shown]
[im 1/102]
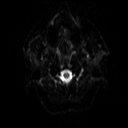
[im 13/102]
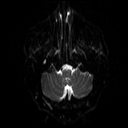
[im 26/102]
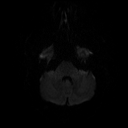
[im 38/102]
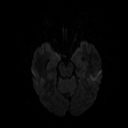
[im 51/102]
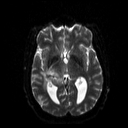
[im 64/102]
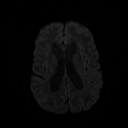
[im 76/102]
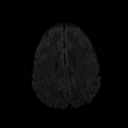
[im 89/102]
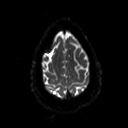
[im 102/102]
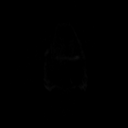

[Series 4: ep2d_diff__adc · axial · 3.0mm · 1.80mm/px · z∈[-32,+117]mm · 4 of 51 slices shown]
[im 1/51]
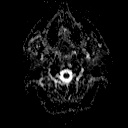
[im 17/51]
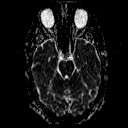
[im 34/51]
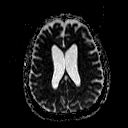
[im 51/51]
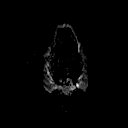

[Series 6: swi_images · axial · 4.0mm · 0.90mm/px · z∈[-35,+119]mm · 3 of 40 slices shown]
[im 1/40]
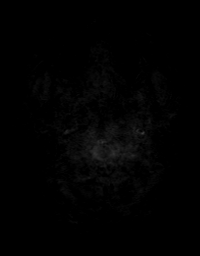
[im 20/40]
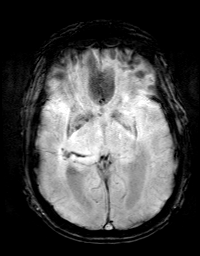
[im 40/40]
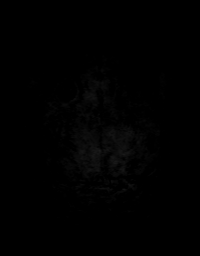

[Series 8: t1_mpr_tra · axial · 1.0mm · 0.72mm/px · z∈[-28,+113]mm · 12 of 144 slices shown (1 of 2)]
[im 1/144]
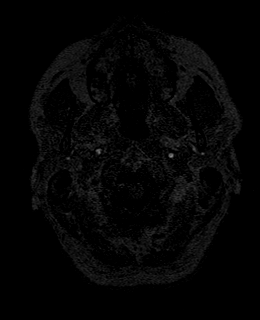
[im 14/144]
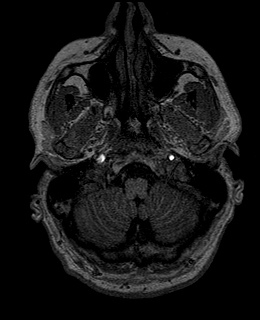
[im 27/144]
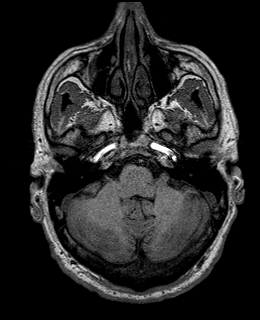
[im 40/144]
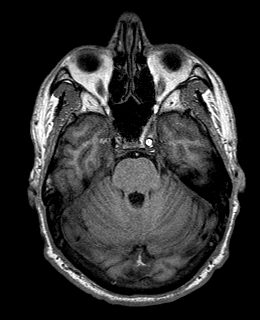
[im 53/144]
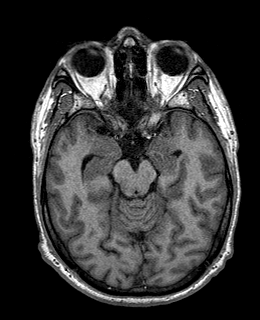
[im 66/144]
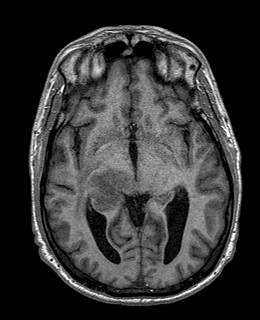
[im 79/144]
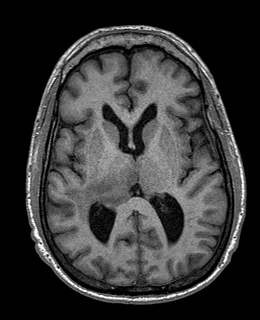
[im 92/144]
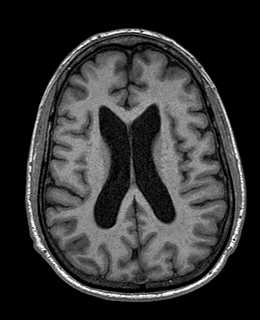
[im 105/144]
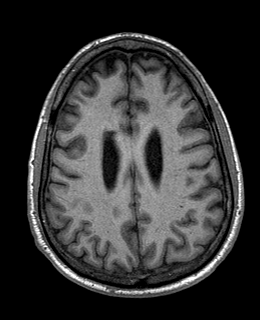
[im 118/144]
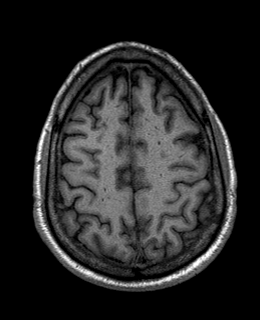
[im 131/144]
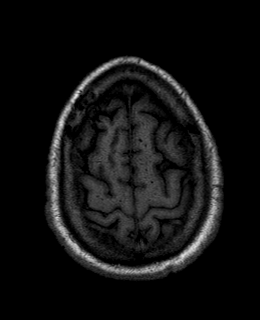
[im 144/144]
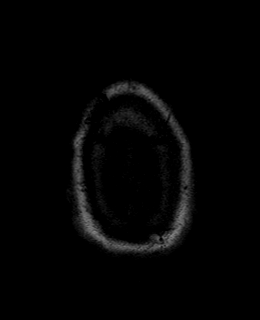

[Series 9: t2_tse_tra · axial · 5.0mm · 0.72mm/px · z∈[-32,+117]mm · 2 of 26 slices shown]
[im 1/26]
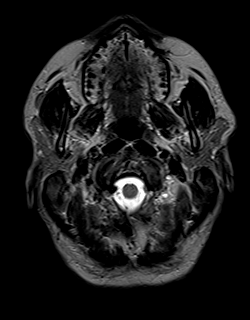
[im 26/26]
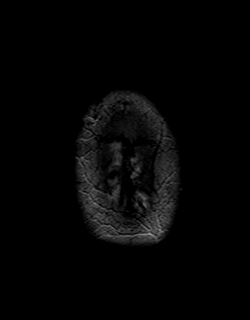

[Series 10: T2 · coronal · 5.0mm · 0.45mm/px · 2 of 28 slices shown]
[im 1/28]
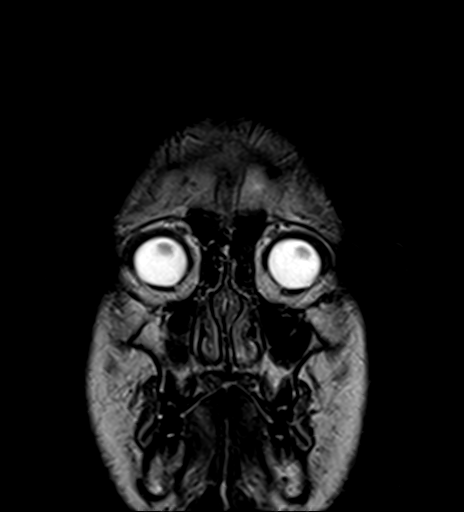
[im 28/28]
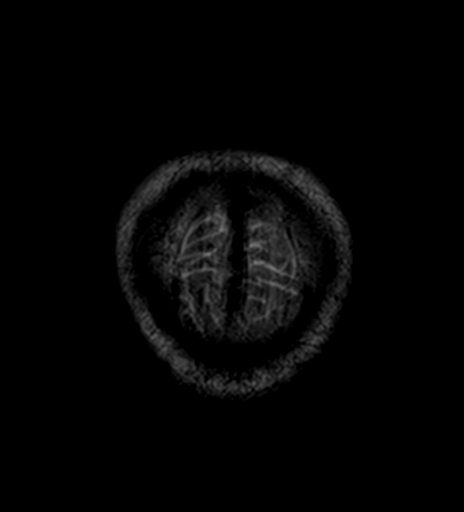

[Series 11: t1_mpr_tra · axial · 1.0mm · 0.72mm/px · z∈[-28,+113]mm · 12 of 144 slices shown (2 of 2)]
[im 1/144]
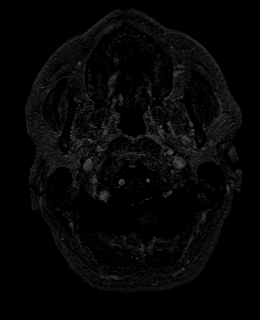
[im 14/144]
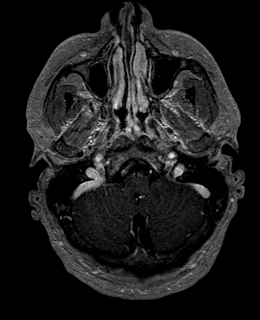
[im 27/144]
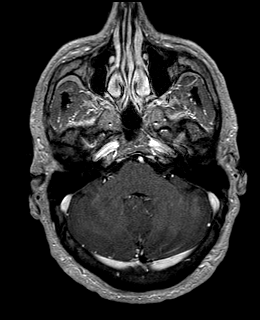
[im 40/144]
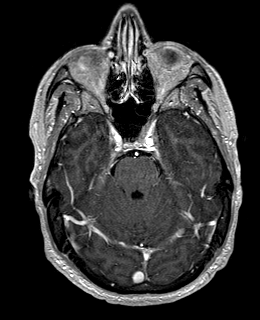
[im 53/144]
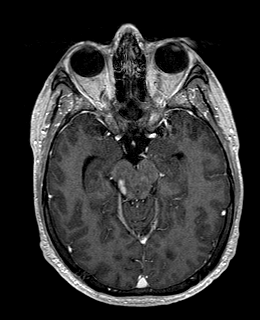
[im 66/144]
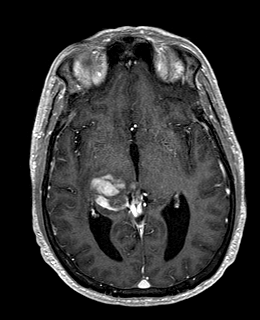
[im 79/144]
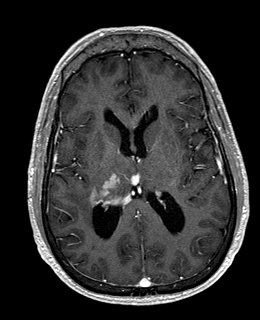
[im 92/144]
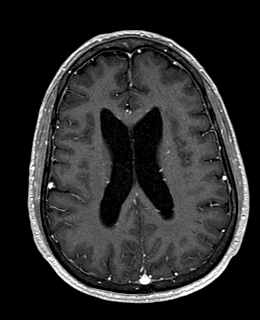
[im 105/144]
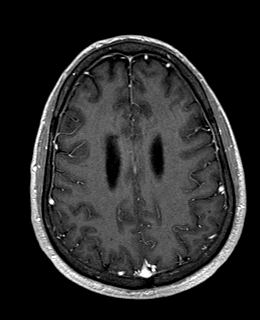
[im 118/144]
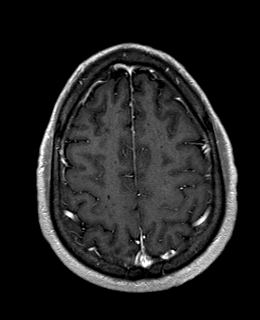
[im 131/144]
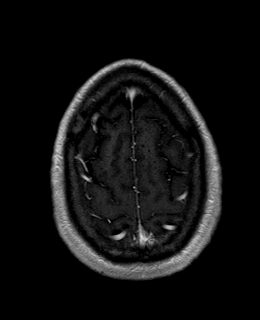
[im 144/144]
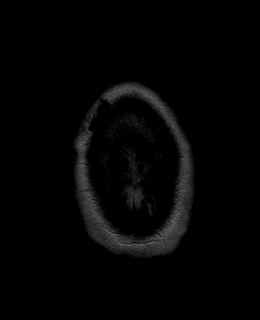

[Series 13: t1_se_sag · sagittal · 5.0mm · 0.47mm/px · 2 of 19 slices shown (2 of 2)]
[im 1/19]
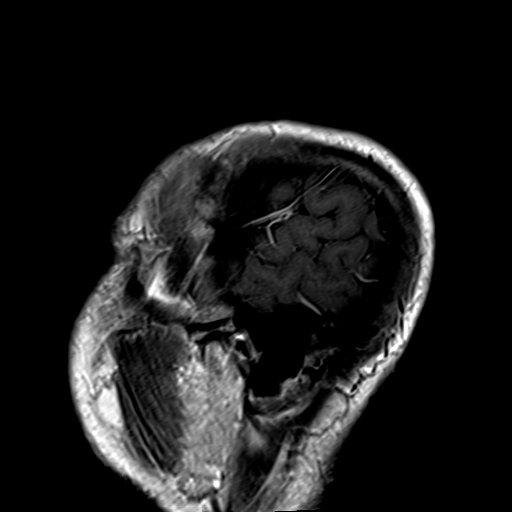
[im 19/19]
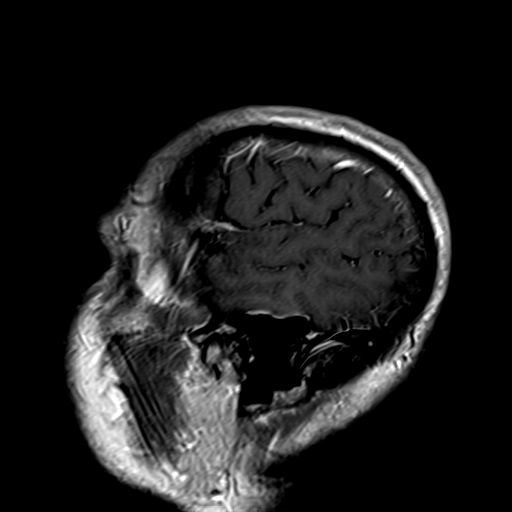

[48 of 48 positions shown; findings below may reference images not displayed]

FINDINGS: Brain: T2 hyperintense mass with patchy nodular enhancement,
centered at the right thalamus and involving the upper midbrain and
periatrial white matter of the temporal lobe. The central bulk is
stable if not decreased. There is increased T2 signal laterally,
without enhancement. The degree of enhancement is similar to prior,
with stable shape of enhancing portions. Enhancement persists at the
level of the lower cerebral aquaduct, but obstructive hydrocephalus
has resolved. There are changes of biopsy and right frontal
ventriculostomy. CSF pulsation artifact is seen through a presumed
third ventriculostomy.

Vascular: Normal flow voids and vascular enhancements

Skull and upper cervical spine: Normal marrow signal

Sinuses/Orbits: Negative
IMPRESSION: 1. Increased nonenhancing T2 signal along the lateral aspect at the
level of the temporal lobe. The degree of enhancement is similar to
prior.
2. Patent third ventriculostomy with resolved hydrocephalus.

## 2019-06-11 MED ORDER — GADOBENATE DIMEGLUMINE 529 MG/ML IV SOLN
17.0000 mL | Freq: Once | INTRAVENOUS | Status: AC | PRN
  Administered 2019-06-11: 17 mL via INTRAVENOUS

## 2019-06-14 ENCOUNTER — Inpatient Hospital Stay: Payer: BC Managed Care – PPO

## 2019-06-14 ENCOUNTER — Inpatient Hospital Stay: Payer: BC Managed Care – PPO | Attending: Internal Medicine | Admitting: Internal Medicine

## 2019-06-14 ENCOUNTER — Other Ambulatory Visit: Payer: Self-pay

## 2019-06-14 VITALS — BP 138/71 | HR 54 | Temp 99.1°F | Resp 18 | Ht 72.0 in | Wt 179.8 lb

## 2019-06-14 DIAGNOSIS — M6281 Muscle weakness (generalized): Secondary | ICD-10-CM | POA: Diagnosis not present

## 2019-06-14 DIAGNOSIS — C710 Malignant neoplasm of cerebrum, except lobes and ventricles: Secondary | ICD-10-CM | POA: Insufficient documentation

## 2019-06-14 DIAGNOSIS — Z79899 Other long term (current) drug therapy: Secondary | ICD-10-CM | POA: Insufficient documentation

## 2019-06-14 DIAGNOSIS — N400 Enlarged prostate without lower urinary tract symptoms: Secondary | ICD-10-CM | POA: Insufficient documentation

## 2019-06-14 DIAGNOSIS — R5383 Other fatigue: Secondary | ICD-10-CM | POA: Diagnosis not present

## 2019-06-14 DIAGNOSIS — Z87891 Personal history of nicotine dependence: Secondary | ICD-10-CM | POA: Insufficient documentation

## 2019-06-14 DIAGNOSIS — Z923 Personal history of irradiation: Secondary | ICD-10-CM | POA: Insufficient documentation

## 2019-06-14 DIAGNOSIS — R279 Unspecified lack of coordination: Secondary | ICD-10-CM | POA: Diagnosis not present

## 2019-06-14 MED ORDER — ONDANSETRON HCL 8 MG PO TABS: 8.0000 mg | ORAL_TABLET | Freq: Two times a day (BID) | ORAL | 1 refills | Status: DC | PRN

## 2019-06-14 MED ORDER — TEMOZOLOMIDE 20 MG PO CAPS: 20.0000 mg | ORAL_CAPSULE | Freq: Every day | ORAL | 0 refills | Status: AC

## 2019-06-14 MED ORDER — DEXAMETHASONE 2 MG PO TABS
2.0000 mg | ORAL_TABLET | Freq: Two times a day (BID) | ORAL | 3 refills | Status: DC
Start: 2019-06-14 — End: 2019-07-19

## 2019-06-14 NOTE — Progress Notes (Signed)
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

## 2019-06-14 NOTE — Progress Notes (Signed)
[facility] [place], [place] [address] [phone]   Interval Evaluation  Date of Service: 06/14/19 Patient Name: Aaron Espinoza Patient MRN: [id number] Patient DOB: [birth year] Provider: Ventura Sellers, MD  Identifying Statement:  Aaron Espinoza is a 44 y.o. male with right thalamic anaplastic astrocytoma   Oncologic History: Oncology History  Anaplastic astrocytoma of thalamus ([place])  03/17/2019 Surgery   Stereotactic biopsy by Dr. Marcello Moores; path demonstrates anaplastic astrocytoma   04/12/2019 - 05/21/2019 Radiation Therapy   IMRT with concurrent Temodar 9m/m2 daily     Biomarkers:  MGMT Unknown.  IDH 1/2 Wild type.  EGFR Unknown  TERT Unknown   Interval History:  Aaron Espinoza to clinic for follow up now in final week of IMRT and concurrent Temodar.  Fatigue has very clearly increased, he is exhausted just getting dressed in the AM.  Requiring help at home because of energy issues.  No other issues with radiation or chemotherapy.  He describes some improvement in headache frequency, tension type.  He has been dosing Ibuprofen with good efficacy these past two weeks.  Maintains full functional independence.  H+P (03/30/19) Patient presented to medical attention in late January, when he noticed mild confusion, gait imbalance.  These symptoms worsened over the next ~10 days, and were accompanied by incontinence.  Brain MRI was performed and demonstrated a right thalamic mass consistent with likely primary brain tumor.  He underwent stereotactic biopsy and ventriculostomy on 03/17/19 with Dr. TMarcello Moores  Following surgery he describes considerable improvement in most of his symptoms; at this time he is near baseline function but "very fatigued" overall.  No seizures or headaches since surgery.  Medications: Current Outpatient Medications on File Prior to Visit  Medication Sig Dispense Refill  . acetaminophen  (TYLENOL) 500 MG tablet Take 1,000 mg by mouth every 6 (six) hours as needed.    . docusate sodium (COLACE) 100 MG capsule Take 100 mg by mouth daily.    .Marland Kitchenibuprofen (ADVIL) 200 MG tablet Take 800 mg by mouth every 6 (six) hours as needed.    . ondansetron (ZOFRAN) 8 MG tablet Take 1 tablet (8 mg total) by mouth 2 (two) times daily as needed (nausea and vomiting). May take 30-60 minutes prior to Temodar administration if nausea/vomiting occurs. (Patient not taking: Reported on 06/14/2019) 30 tablet 1  . polyethylene glycol (MIRALAX / GLYCOLAX) 17 g packet Take 1 packet by mouth daily.    .Marland Kitchentemozolomide (TEMODAR) 140 MG capsule TAKE 1 CAPSULE (140 MG TOTAL) BY MOUTH DAILY EVERYDAY WHILE ON RADIATION THERAPY . MAY TAKE ON AN EMPTY STOMACH TO DECREASE NAUSEA & VOMG. (Patient not taking: Reported on 06/14/2019) 30 capsule 0   No current facility-administered medications on file prior to visit.    Allergies: No Known Allergies Past Medical History:  Past Medical History:  Diagnosis Date  . BPH (benign prostatic hyperplasia) 07/20/2015  . Brain mass   . Cognitive dysfunction 01/2019  . Fatigue 01/2019  . Headache   . [place]   NF1   Past Surgical History:  Past Surgical History:  Procedure Laterality Date  . HAND SURGERY Left    pinky - tumor benign  . LEG SURGERY Left    inner thigh tumor - benign  . VENTRICULOSTOMY N/A 03/17/2019   Procedure: ENDOSCOPIC THIRD VENTRICULOSTOMY;  Surgeon: TVallarie Mare MD;  [facility]  Service: Neurosurgery;  Laterality: N/A;  ENDOSCOPIC  THIRD VENTRICULOSTOMY   Social History:  Social History   Socioeconomic History  . Marital status: Married    Spouse name: Not on file  . Number of children: Not on file  . Years of education: Not on file  . Highest education level: Not on file  Occupational History  . Not on file  Tobacco Use  . Smoking status: Former Smoker    Types: Cigars, Cigarettes  . Smokeless  tobacco: Never Used  . Tobacco comment: Quit smoking cigarettes 25 yrs ago, quit cigars in 2020  Substance and Sexual Activity  . Alcohol use: Yes    Alcohol/week: 6.0 - 9.0 standard drinks    Types: 6 - 9 Standard drinks or equivalent per week  . Drug use: No  . Sexual activity: Not on file  Other Topics Concern  . Not on file  Social History Narrative  . Not on file   Social Determinants of Health   Financial Resource Strain:   . Difficulty of Paying Living Expenses:   Food Insecurity:   . Worried About Charity fundraiser in the Last Year:   . Arboriculturist in the Last Year:   Transportation Needs:   . Film/video editor (Medical):   Marland Kitchen Lack of Transportation (Non-Medical):   Physical Activity:   . Days of Exercise per Week:   . Minutes of Exercise per Session:   Stress:   . Feeling of Stress :   Social Connections:   . Frequency of Communication with Friends and Family:   . Frequency of Social Gatherings with Friends and Family:   . Attends Religious Services:   . Active Member of Clubs or Organizations:   . Attends Archivist Meetings:   Marland Kitchen Marital Status:   Intimate Partner Violence:   . Fear of Current or Ex-Partner:   . Emotionally Abused:   Marland Kitchen Physically Abused:   . Sexually Abused:    Family History:  Family History  Problem Relation Age of Onset  . Cancer Paternal Grandfather        Prostate    Review of Systems: Constitutional: Doesn't report fevers, chills or abnormal weight loss Eyes: Doesn't report blurriness of vision Ears, nose, mouth, throat, and face: Doesn't report sore throat Respiratory: Doesn't report cough, dyspnea or wheezes Cardiovascular: Doesn't report palpitation, chest discomfort  Gastrointestinal:  Doesn't report nausea, constipation, diarrhea GU: Doesn't report incontinence Skin: Doesn't report skin rashes Neurological: Per HPI Musculoskeletal: Doesn't report joint pain Behavioral/Psych: Doesn't report  anxiety  Physical Exam: Vitals:   06/14/19 0954  BP: 138/71  Pulse: (!) 54  Resp: 18  Temp: 99.1 F (37.3 C)  SpO2: 100%   KPS: 90. General: ++fatigue Head: Normal EENT: No conjunctival injection or scleral icterus.  Lungs: Resp effort normal Cardiac: Regular rate Abdomen: Non-distended abdomen Skin: No rashes cyanosis or petechiae. Extremities: No clubbing or edema  Neurologic Exam: Mental Status: Awake, alert, attentive to examiner. Oriented to self and environment. Language is fluent with intact comprehension.  Cranial Nerves: Visual acuity is grossly normal. Visual fields are full. Extra-ocular movements intact. No ptosis. Face is symmetric Motor: Tone and bulk are normal. Power is full in both arms and legs. Reflexes are symmetric, no pathologic reflexes present.  Sensory: Intact to light touch Gait: Normal.   Labs: I have reviewed the data as listed    Component Value Date/Time   NA 141 05/20/2019 1030   K 4.4 05/20/2019 1030   CL 104 05/20/2019  1030   CO2 28 05/20/2019 1030   GLUCOSE 71 05/20/2019 1030   BUN 11 05/20/2019 1030   CREATININE 0.87 05/20/2019 1030   CREATININE 0.83 03/04/2019 1206   CALCIUM 9.5 05/20/2019 1030   PROT 7.0 05/20/2019 1030   ALBUMIN 3.9 05/20/2019 1030   AST 12 (L) 05/20/2019 1030   ALT 15 05/20/2019 1030   ALKPHOS 63 05/20/2019 1030   BILITOT 0.9 05/20/2019 1030   GFRNONAA >60 05/20/2019 1030   GFRNONAA 106 03/04/2019 1206   GFRAA >60 05/20/2019 1030   GFRAA 123 03/04/2019 1206   Lab Results  Component Value Date   WBC 5.7 05/20/2019   NEUTROABS 3.9 05/20/2019   HGB 15.7 05/20/2019   HCT 46.0 05/20/2019   MCV 87.6 05/20/2019   PLT 259 05/20/2019   Imaging:  [facility] Clinician Interpretation: I have personally reviewed the CNS images as listed.  My interpretation, in the context of the patient's clinical presentation, is treatment effect vs true progression  MR BRAIN W WO CONTRAST  Result Date: 06/11/2019 CLINICAL DATA:   Brain neoplasm follow-up. Anaplastic astrocytoma with interval radiation therapy. EXAM: MRI HEAD WITHOUT AND WITH CONTRAST TECHNIQUE: Multiplanar, multiecho pulse sequences of the brain and surrounding structures were obtained without and with intravenous contrast. CONTRAST:  36m MULTIHANCE GADOBENATE DIMEGLUMINE 529 MG/ML IV SOLN COMPARISON:  03/16/2019 FINDINGS: Brain: T2 hyperintense mass with patchy nodular enhancement, centered at the right thalamus and involving the upper midbrain and periatrial white matter of the temporal lobe. The central bulk is stable if not decreased. There is increased T2 signal laterally, without enhancement. The degree of enhancement is similar to prior, with stable shape of enhancing portions. Enhancement persists at the level of the lower cerebral aquaduct, but obstructive hydrocephalus has resolved. There are changes of biopsy and right frontal ventriculostomy. CSF pulsation artifact is seen through a presumed third ventriculostomy. Vascular: Normal flow voids and vascular enhancements Skull and upper cervical spine: Normal marrow signal Sinuses/Orbits: Negative IMPRESSION: 1. Increased nonenhancing T2 signal along the lateral aspect at the level of the temporal lobe. The degree of enhancement is similar to prior. 2. Patent third ventriculostomy with resolved hydrocephalus. Electronically Signed   By: JMonte FantasiaM.D.   On: 06/11/2019 16:19    Assessment/Plan Anaplastic astrocytoma of thalamus ([place] [C71.0]  Mr. PVeselkais clinically stable, now having completed IMRT+TMZ.  MRI demonstrates increased enhancement only within high dose radiation field, c/w suspected radioinflammatory change.  Progressive tumor cannot be ruled out.  We recommended initiating treatment with cycle #1 Temozolomide 150 mg/m2, on for five days and off for twenty three days in twenty eight day cycles. The patient will have a complete blood count performed on days 21 and 28 of each cycle, and a  comprehensive metabolic panel performed on day 28 of each cycle. Labs may need to be performed more often. Zofran will prescribed for home use for nausea/vomiting.   Chemotherapy should be held for the following:  ANC less than 1,000  Platelets less than 100,000  LFT or creatinine greater than 2x ULN  If clinical concerns/contraindications develop  Con't Tylelonel and PRN NSAIDs less than daily if possible for breakthrough headaches.    We appreciate the opportunity to participate in the care of JSAMARTH OGLE  He will follow up with labs in 5 weeks following completion of cycle #1.  All questions were answered. The patient knows to call the clinic with any problems, questions or concerns. No barriers to learning were detected.  The total time spent in the encounter was 40 minutes and more than 50% was on counseling and review of test results   Ventura Sellers, MD Medical Director of Neuro-Oncology [facility] [place] 06/14/19 3:30 PM

## 2019-06-15 ENCOUNTER — Telehealth: Payer: Self-pay | Admitting: Internal Medicine

## 2019-06-15 NOTE — Telephone Encounter (Signed)
Scheduled appt per 5/10 los.  Spoke with pt and he is aware of the appt date and time,

## 2019-06-16 DIAGNOSIS — R279 Unspecified lack of coordination: Secondary | ICD-10-CM | POA: Diagnosis not present

## 2019-06-16 DIAGNOSIS — M6281 Muscle weakness (generalized): Secondary | ICD-10-CM | POA: Diagnosis not present

## 2019-06-17 NOTE — Progress Notes (Signed)
  Radiation Oncology         ([phone]) [phone] ________________________________  Name: Aaron Espinoza MRN: [id number]  Date: 05/21/2019  DOB: 1976-02-03  End of Treatment Note  Diagnosis:   Anaplastic astocytoma     Indication for treatment::  curative       Radiation treatment dates:   04/12/19 - 05/21/19  Site/dose:   The patient was treated to the tumor area and areas of edema initially to a dose of 46 Gy using a IMRT technique.  The patient then received a 14 Gy boost to yield a final dose of 60 Gy.  Narrative: The patient tolerated radiation treatment relatively well.     Plan: The patient has completed radiation treatment. The patient will return to radiation oncology clinic for routine followup in one month. I advised the patient to call or return sooner if they have any questions or concerns related to their recovery or treatment. ________________________________  Jodelle Gross, M.D., Ph.D.

## 2019-06-21 ENCOUNTER — Telehealth: Payer: Self-pay | Admitting: *Deleted

## 2019-06-21 NOTE — Telephone Encounter (Signed)
Clarifying question from [facility] about last Temodar Rx.  Patient has chemo on hand and Dr. Mickeal Skinner only refilled this time the difference in his Chemo.  Has 140 mg tablets and will take Temodar 140+140+ 20 mg = 300 mg

## 2019-06-29 ENCOUNTER — Other Ambulatory Visit: Payer: Self-pay | Admitting: Internal Medicine

## 2019-06-29 DIAGNOSIS — C710 Malignant neoplasm of cerebrum, except lobes and ventricles: Secondary | ICD-10-CM

## 2019-07-19 ENCOUNTER — Inpatient Hospital Stay: Payer: BC Managed Care – PPO

## 2019-07-19 ENCOUNTER — Other Ambulatory Visit: Payer: Self-pay

## 2019-07-19 ENCOUNTER — Inpatient Hospital Stay: Payer: BC Managed Care – PPO | Attending: Internal Medicine | Admitting: Internal Medicine

## 2019-07-19 VITALS — BP 129/97 | HR 65 | Temp 99.0°F | Resp 20 | Ht 72.0 in | Wt 188.0 lb

## 2019-07-19 DIAGNOSIS — C710 Malignant neoplasm of cerebrum, except lobes and ventricles: Secondary | ICD-10-CM | POA: Diagnosis not present

## 2019-07-19 DIAGNOSIS — Z79899 Other long term (current) drug therapy: Secondary | ICD-10-CM | POA: Diagnosis not present

## 2019-07-19 DIAGNOSIS — Z9221 Personal history of antineoplastic chemotherapy: Secondary | ICD-10-CM | POA: Diagnosis not present

## 2019-07-19 DIAGNOSIS — N400 Enlarged prostate without lower urinary tract symptoms: Secondary | ICD-10-CM | POA: Insufficient documentation

## 2019-07-19 DIAGNOSIS — R5383 Other fatigue: Secondary | ICD-10-CM | POA: Diagnosis not present

## 2019-07-19 DIAGNOSIS — Z87891 Personal history of nicotine dependence: Secondary | ICD-10-CM | POA: Diagnosis not present

## 2019-07-19 DIAGNOSIS — Z923 Personal history of irradiation: Secondary | ICD-10-CM | POA: Diagnosis not present

## 2019-07-19 LAB — CMP (CANCER CENTER ONLY)
ALT: 15 U/L (ref 0–44)
AST: 10 U/L — ABNORMAL LOW (ref 15–41)
Albumin: 3.9 g/dL (ref 3.5–5.0)
Alkaline Phosphatase: 58 U/L (ref 38–126)
Anion gap: 11 (ref 5–15)
BUN: 14 mg/dL (ref 6–20)
CO2: 24 mmol/L (ref 22–32)
Calcium: 9.4 mg/dL (ref 8.9–10.3)
Chloride: 102 mmol/L (ref 98–111)
Creatinine: 0.81 mg/dL (ref 0.61–1.24)
GFR, Est AFR Am: >60 mL/min (ref >60)
GFR, Estimated: >60 mL/min (ref >60)
Glucose, Bld: 101 mg/dL — ABNORMAL HIGH (ref 70–99)
Potassium: 4 mmol/L (ref 3.5–5.1)
Sodium: 137 mmol/L (ref 135–145)
Total Bilirubin: 0.6 mg/dL (ref 0.3–1.2)
Total Protein: 7 g/dL (ref 6.5–8.1)

## 2019-07-19 LAB — CBC WITH DIFFERENTIAL (CANCER CENTER ONLY)
Abs Immature Granulocytes: 0.06 10*3/uL (ref 0.00–0.07)
Basophils Absolute: 0 10*3/uL (ref 0.0–0.1)
Basophils Relative: 0 %
Eosinophils Absolute: 0 10*3/uL (ref 0.0–0.5)
Eosinophils Relative: 0 %
HCT: 47.2 % (ref 39.0–52.0)
Hemoglobin: 16.6 g/dL (ref 13.0–17.0)
Immature Granulocytes: 1 %
Lymphocytes Relative: 11 %
Lymphs Abs: 1 10*3/uL (ref 0.7–4.0)
MCH: 30.8 pg (ref 26.0–34.0)
MCHC: 35.2 g/dL (ref 30.0–36.0)
MCV: 87.6 fL (ref 80.0–100.0)
Monocytes Absolute: 0.9 10*3/uL (ref 0.1–1.0)
Monocytes Relative: 10 %
Neutro Abs: 7.2 10*3/uL (ref 1.7–7.7)
Neutrophils Relative %: 78 %
Platelet Count: 265 10*3/uL (ref 150–400)
RBC: 5.39 MIL/uL (ref 4.22–5.81)
RDW: 11.8 % (ref 11.5–15.5)
WBC Count: 9.2 10*3/uL (ref 4.0–10.5)
nRBC: 0 % (ref 0.0–0.2)

## 2019-07-19 MED ORDER — DEXAMETHASONE 2 MG PO TABS
1.0000 mg | ORAL_TABLET | Freq: Two times a day (BID) | ORAL | 3 refills | Status: DC
Start: 2019-07-19 — End: 2019-08-24

## 2019-07-19 MED ORDER — TEMOZOLOMIDE 20 MG PO CAPS: 20.0000 mg | ORAL_CAPSULE | Freq: Every day | ORAL | 0 refills | Status: AC

## 2019-07-19 MED ORDER — TEMOZOLOMIDE 100 MG PO CAPS: 100.0000 mg | ORAL_CAPSULE | Freq: Every day | ORAL | 0 refills | Status: AC

## 2019-07-19 NOTE — Progress Notes (Signed)
[facility] [place], [place] [address] [phone]   Interval Evaluation  Date of Service: 07/19/19 Patient Name: Aaron Espinoza Patient MRN: [id number] Patient DOB: 28-Sep-1975 Provider: Ventura Sellers, MD  Identifying Statement:  Aaron Espinoza is a 44 y.o. male with right thalamic anaplastic astrocytoma   Oncologic History: Oncology History  Anaplastic astrocytoma of thalamus ([place])  03/17/2019 Surgery   Stereotactic biopsy by Dr. Marcello Moores; path demonstrates anaplastic astrocytoma   04/12/2019 - 05/21/2019 Radiation Therapy   IMRT with concurrent Temodar 7m/m2 daily   06/14/2019 -  Chemotherapy   The patient had [No matching medication found in this treatment plan]  for chemotherapy treatment.      Biomarkers:  MGMT Unknown.  IDH 1/2 Wild type.  EGFR Unknown  TERT Unknown   Interval History:  JJAHSHUA BONITOpresents to clinic for follow up now having completed first cycle of 5-day TMZ.  Fatigue has been stable but is still problematic.  Requiring help at home because of energy issues.  Some episodes of imbalance or dizziness which are new, but self limited. No other issues with chemotherapy.  Same issues with left sided vision. Maintains full functional independence otherwise.  Decadron has been dosing 244mdaily.  H+P (03/30/19) Patient presented to medical attention in late January, when he noticed mild confusion, gait imbalance.  These symptoms worsened over the next ~10 days, and were accompanied by incontinence.  Brain MRI was performed and demonstrated a right thalamic mass consistent with likely primary brain tumor.  He underwent stereotactic biopsy and ventriculostomy on 03/17/19 with Dr. ThMarcello Moores Following surgery he describes considerable improvement in most of his symptoms; at this time he is near baseline function but "very fatigued" overall.  No seizures or headaches since surgery.  Medications: Current  Outpatient Medications on File Prior to Visit  Medication Sig Dispense Refill  . acetaminophen (TYLENOL) 500 MG tablet Take 1,000 mg by mouth every 6 (six) hours as needed.    . Marland Kitchenexamethasone (DECADRON) 2 MG tablet Take 1 tablet (2 mg total) by mouth 2 (two) times daily with a meal. 30 tablet 3  . docusate sodium (COLACE) 100 MG capsule Take 100 mg by mouth daily.    . Marland Kitchenbuprofen (ADVIL) 200 MG tablet Take 800 mg by mouth every 6 (six) hours as needed.    . ondansetron (ZOFRAN) 8 MG tablet Take 1 tablet (8 mg total) by mouth 2 (two) times daily as needed (nausea and vomiting). May take 30-60 minutes prior to Temodar administration if nausea/vomiting occurs. 30 tablet 1  . polyethylene glycol (MIRALAX / GLYCOLAX) 17 g packet Take 1 packet by mouth daily.     No current facility-administered medications on file prior to visit.    Allergies: No Known Allergies Past Medical History:  Past Medical History:  Diagnosis Date  . BPH (benign prostatic hyperplasia) 07/20/2015  . Brain mass   . Cognitive dysfunction 01/2019  . Fatigue 01/2019  . Headache   . NF [place]ne[place]fibrom[place]) [place]   NF1   Past Surgical History:  Past Surgical History:  Procedure Laterality Date  . HAND SURGERY Left    pinky - tumor benign  . LEG SURGERY Left    inner thigh tumor - benign  . VENTRICULOSTOMY N/A 03/17/2019   Procedure: ENDOSCOPIC THIRD VENTRICULOSTOMY;  Surgeon: ThVallarie MareMD;  [place] [facility] Service: Neurosurgery;  Laterality: N/A;  ENDOSCOPIC THIRD VENTRICULOSTOMY   Social  History:  Social History   Socioeconomic History  . Marital status: Married    Spouse name: Not on file  . Number of children: Not on file  . Years of education: Not on file  . Highest education level: Not on file  Occupational History  . Not on file  Tobacco Use  . Smoking status: Former Smoker    Types: Cigars, Cigarettes  . Smokeless tobacco: Never Used  . Tobacco comment: Quit smoking cigarettes 25  yrs ago, quit cigars in 2020  Vaping Use  . Vaping Use: Never used  Substance and Sexual Activity  . Alcohol use: Yes    Alcohol/week: 6.0 - 9.0 standard drinks    Types: 6 - 9 Standard drinks or equivalent per week  . Drug use: No  . Sexual activity: Not on file  Other Topics Concern  . Not on file  Social History Narrative  . Not on file   Social Determinants of Health   Financial Resource Strain:   . Difficulty of Paying Living Expenses:   Food Insecurity:   . Worried About Charity fundraiser in the Last Year:   . Arboriculturist in the Last Year:   Transportation Needs:   . Film/video editor (Medical):   Marland Kitchen Lack of Transportation (Non-Medical):   Physical Activity:   . Days of Exercise per Week:   . Minutes of Exercise per Session:   Stress:   . Feeling of Stress :   Social Connections:   . Frequency of Communication with Friends and Family:   . Frequency of Social Gatherings with Friends and Family:   . Attends Religious Services:   . Active Member of Clubs or Organizations:   . Attends Archivist Meetings:   Marland Kitchen Marital Status:   Intimate Partner Violence:   . Fear of Current or Ex-Partner:   . Emotionally Abused:   Marland Kitchen Physically Abused:   . Sexually Abused:    Family History:  Family History  Problem Relation Age of Onset  . Cancer Paternal Grandfather        Prostate    Review of Systems: Constitutional: Doesn't report fevers, chills or abnormal weight loss Eyes: Doesn't report blurriness of vision Ears, nose, mouth, throat, and face: Doesn't report sore throat Respiratory: Doesn't report cough, dyspnea or wheezes Cardiovascular: Doesn't report palpitation, chest discomfort  Gastrointestinal:  Doesn't report nausea, constipation, diarrhea GU: Doesn't report incontinence Skin: Doesn't report skin rashes Neurological: Per HPI Musculoskeletal: Doesn't report joint pain Behavioral/Psych: Doesn't report anxiety  Physical Exam: Vitals:    07/19/19 1150  BP: (!) 129/97  Pulse: 65  Resp: 20  Temp: 99 F (37.2 C)  SpO2: 99%   KPS: 90. General: ++fatigue Head: Normal EENT: No conjunctival injection or scleral icterus.  Lungs: Resp effort normal Cardiac: Regular rate Abdomen: Non-distended abdomen Skin: No rashes cyanosis or petechiae. Extremities: No clubbing or edema  Neurologic Exam: Mental Status: Awake, alert, attentive to examiner. Oriented to self and environment. Language is fluent with intact comprehension.  Cranial Nerves: Visual acuity is grossly normal. L visual field impairment. Extra-ocular movements intact. No ptosis. Face is symmetric Motor: Tone and bulk are normal. Power is full in both arms and legs. Reflexes are symmetric, no pathologic reflexes present.  Sensory: Intact to light touch Gait: Normal.   Labs: I have reviewed the data as listed    Component Value Date/Time   NA 141 05/20/2019 1030   K 4.4 05/20/2019 1030  CL 104 05/20/2019 1030   CO2 28 05/20/2019 1030   GLUCOSE 71 05/20/2019 1030   BUN 11 05/20/2019 1030   CREATININE 0.87 05/20/2019 1030   CREATININE 0.83 03/04/2019 1206   CALCIUM 9.5 05/20/2019 1030   PROT 7.0 05/20/2019 1030   ALBUMIN 3.9 05/20/2019 1030   AST 12 (L) 05/20/2019 1030   ALT 15 05/20/2019 1030   ALKPHOS 63 05/20/2019 1030   BILITOT 0.9 05/20/2019 1030   GFRNONAA >60 05/20/2019 1030   GFRNONAA 106 03/04/2019 1206   GFRAA >60 05/20/2019 1030   GFRAA 123 03/04/2019 1206   Lab Results  Component Value Date   WBC 9.2 07/19/2019   NEUTROABS 7.2 07/19/2019   HGB 16.6 07/19/2019   HCT 47.2 07/19/2019   MCV 87.6 07/19/2019   PLT 265 07/19/2019    Assessment/Plan Anaplastic astrocytoma of thalamus ([place]) [C71.0]  Mr. Gahan is clinically stable, now having completed cycle #1 5-day Temozolomide.    We recommended continuing treatment with cycle #2 Temozolomide increased to 200 mg/m2, on for five days and off for twenty three days in twenty eight day  cycles. The patient will have a complete blood count performed on days 21 and 28 of each cycle, and a comprehensive metabolic panel performed on day 28 of each cycle. Labs may need to be performed more often. Zofran will prescribed for home use for nausea/vomiting.   Chemotherapy should be held for the following:  ANC less than 1,000  Platelets less than 100,000  LFT or creatinine greater than 2x ULN  If clinical concerns/contraindications develop  Decadron should decreased to 43m daily.  Con't Tylenol and PRN NSAIDs less than daily if possible for breakthrough headaches.    We appreciate the opportunity to participate in the care of JKOKI BUXTON  He will follow up with MRI brain for evaluation in 1 month, or sooner if needed.  All questions were answered. The patient knows to call the clinic with any problems, questions or concerns. No barriers to learning were detected.  The total time spent in the encounter was 30 minutes and more than 50% was on counseling and review of test results   ZVentura Sellers MD Medical Director of Neuro-Oncology [facility]06/14/21 12:12 PM

## 2019-07-20 ENCOUNTER — Telehealth: Payer: Self-pay | Admitting: Internal Medicine

## 2019-07-20 NOTE — Telephone Encounter (Signed)
Scheduled appt per 6/14 los. Spoke with pt and he is aware of his scheduled appt date and time.

## 2019-07-22 ENCOUNTER — Other Ambulatory Visit: Payer: Self-pay | Admitting: Internal Medicine

## 2019-07-22 DIAGNOSIS — C710 Malignant neoplasm of cerebrum, except lobes and ventricles: Secondary | ICD-10-CM

## 2019-07-27 ENCOUNTER — Other Ambulatory Visit: Payer: Self-pay | Admitting: *Deleted

## 2019-07-27 ENCOUNTER — Ambulatory Visit (INDEPENDENT_AMBULATORY_CARE_PROVIDER_SITE_OTHER): Payer: BC Managed Care – PPO | Admitting: Sports Medicine

## 2019-07-27 ENCOUNTER — Other Ambulatory Visit: Payer: Self-pay

## 2019-07-27 ENCOUNTER — Encounter: Payer: Self-pay | Admitting: Sports Medicine

## 2019-07-27 ENCOUNTER — Ambulatory Visit (INDEPENDENT_AMBULATORY_CARE_PROVIDER_SITE_OTHER): Payer: BC Managed Care – PPO

## 2019-07-27 DIAGNOSIS — M79601 Pain in right arm: Secondary | ICD-10-CM

## 2019-07-27 DIAGNOSIS — M25521 Pain in right elbow: Secondary | ICD-10-CM | POA: Diagnosis not present

## 2019-07-27 DIAGNOSIS — M25511 Pain in right shoulder: Secondary | ICD-10-CM | POA: Diagnosis not present

## 2019-07-27 IMAGING — DX DG SHOULDER 2+V*R*
3 series · 3 of 3 positions shown · non-contrast
Comparison: Chest x-ray [DATE].

CLINICAL DATA: Right shoulder pain.  No known injury.

EXAM:
RIGHT SHOULDER - 2+ VIEW

[shoulder grashey]
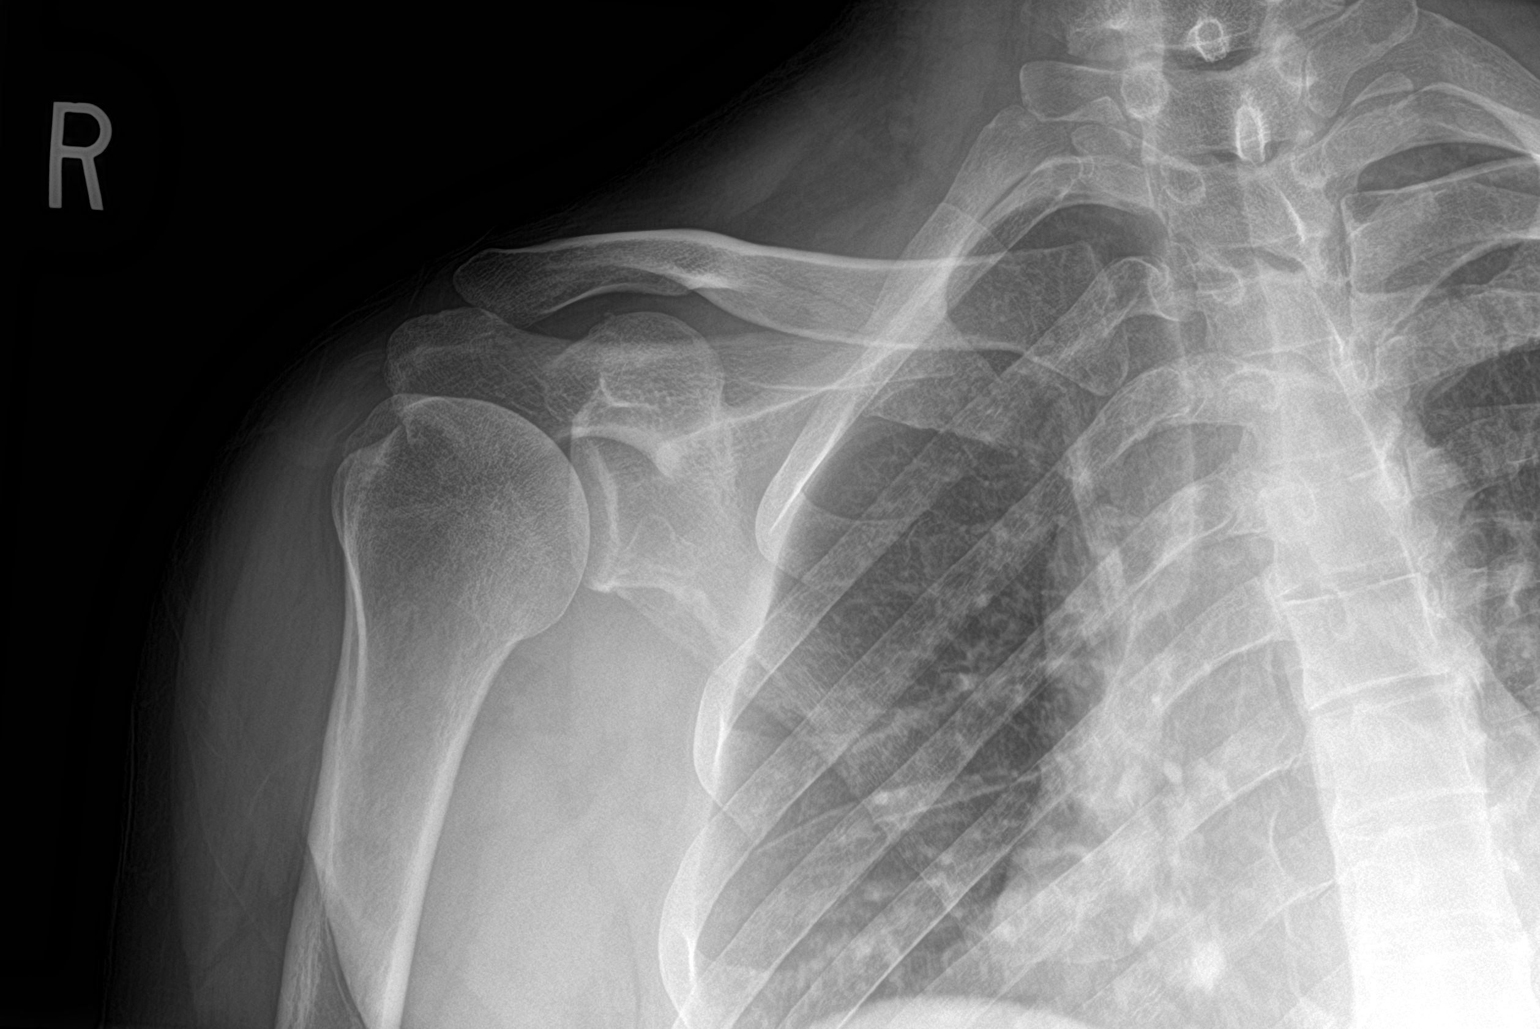

[shoulder y view]
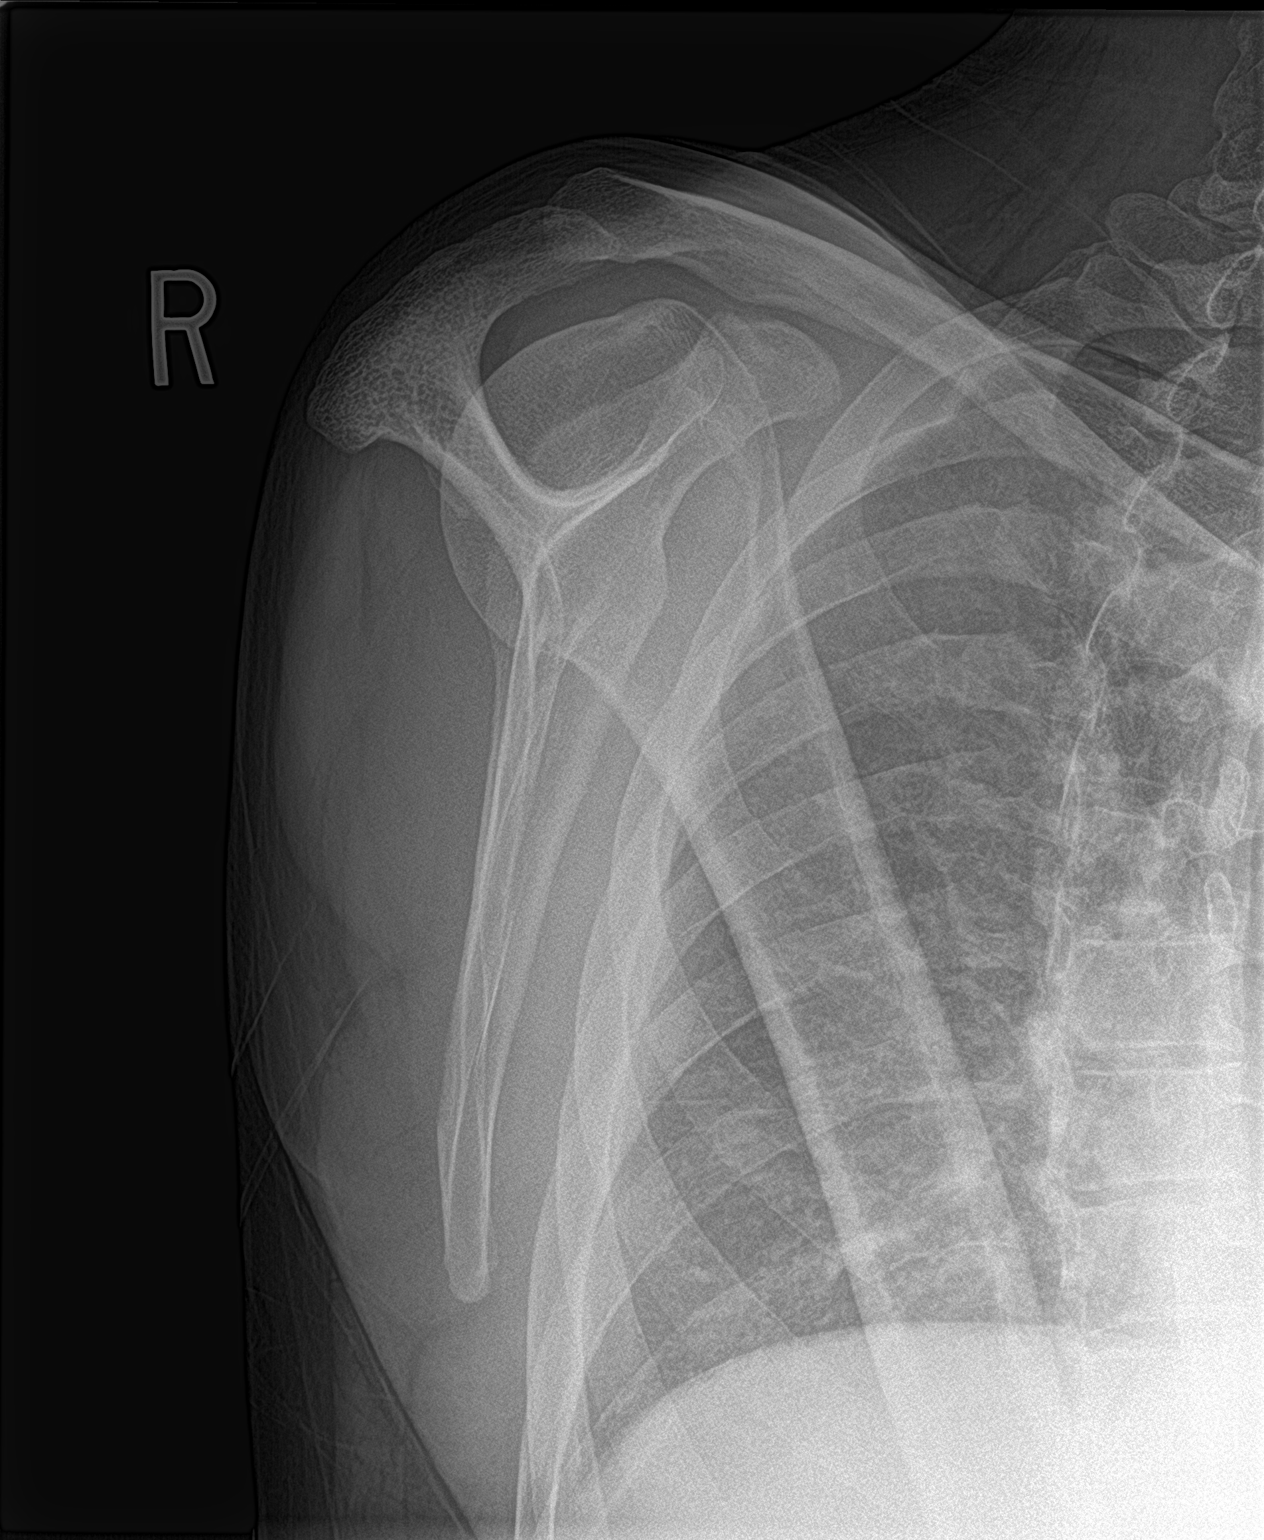

[shoulder axillary]
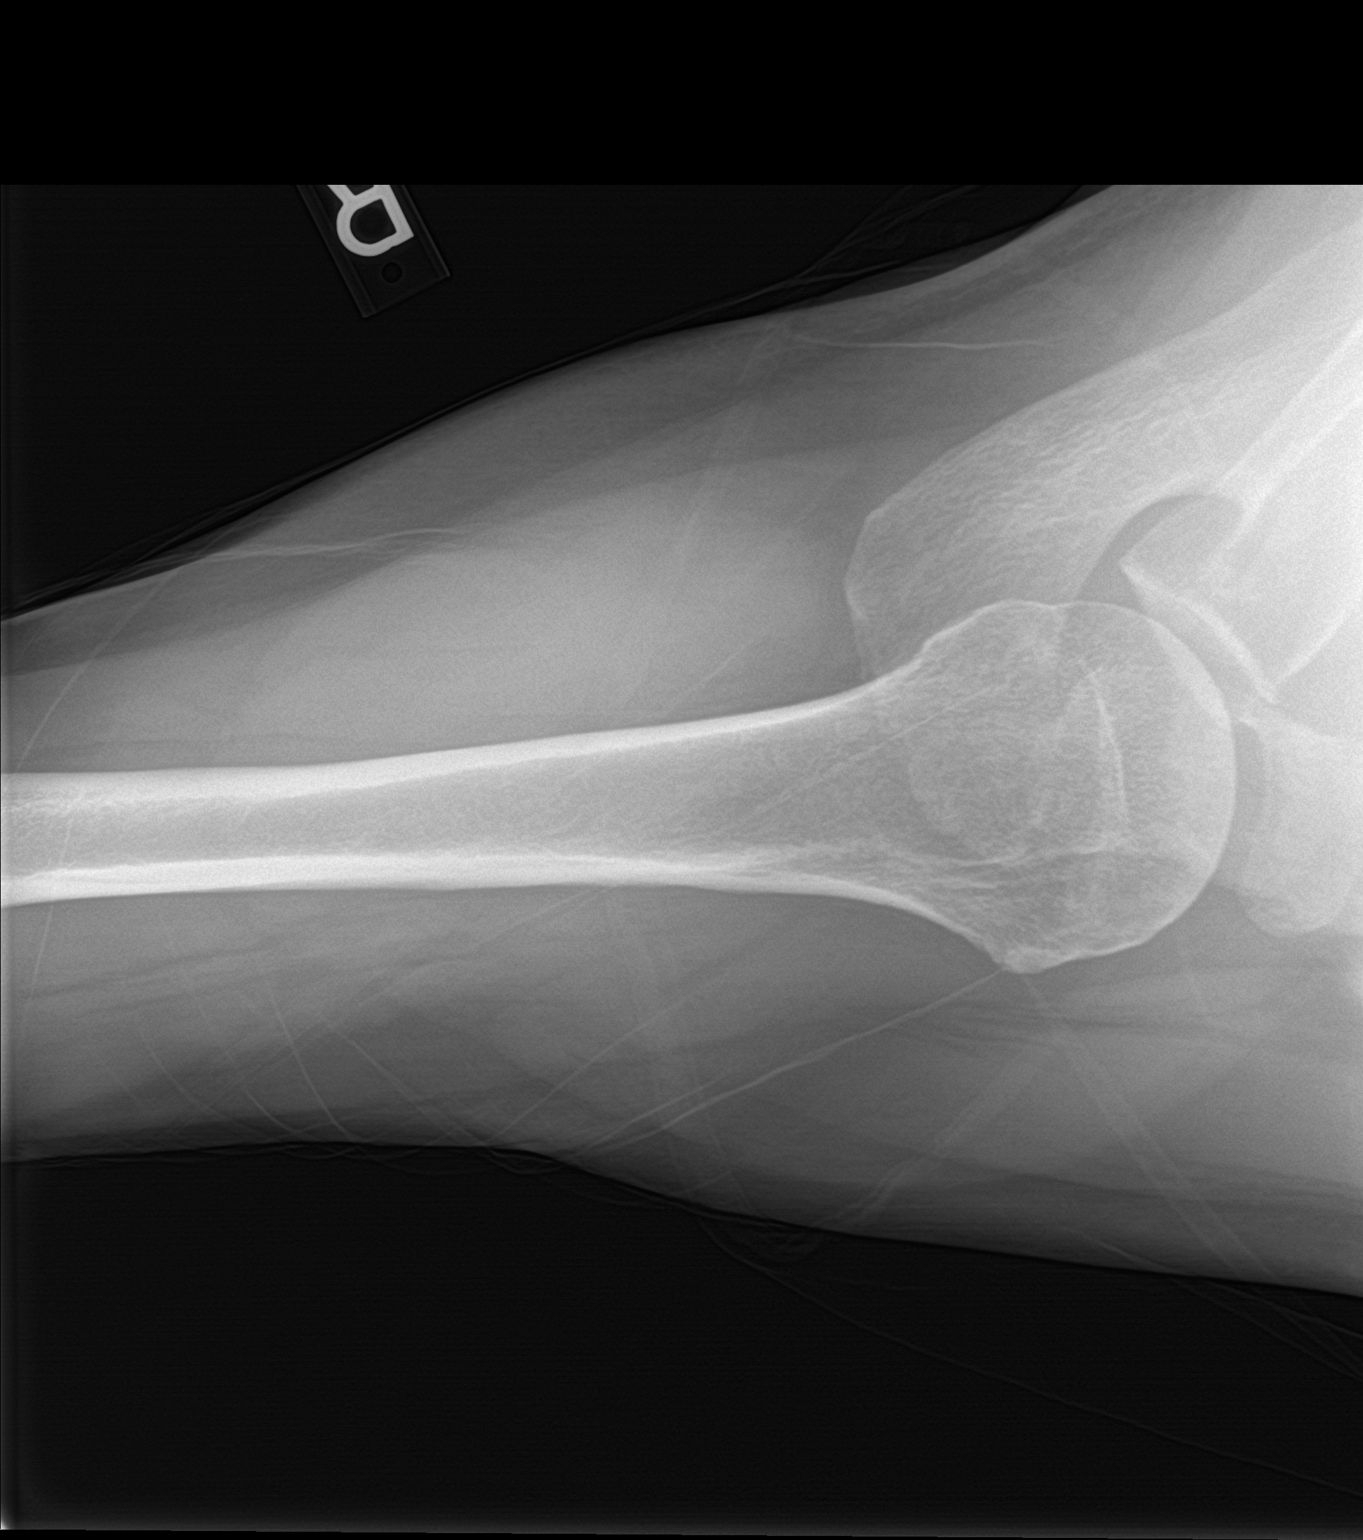

[3 of 3 positions shown; findings below may reference images not displayed]

FINDINGS: No acute bony or joint abnormality identified. No evidence of
fracture or dislocation. No evidence of separation.
IMPRESSION: No acute bony or joint abnormality identified.

## 2019-07-27 IMAGING — DX DG ELBOW COMPLETE 3+V*R*
4 series · 4 of 4 positions shown · non-contrast
Comparison: No prior.

CLINICAL DATA: Right elbow pain.

EXAM:
RIGHT ELBOW - COMPLETE 3+ VIEW

[elbow ap]
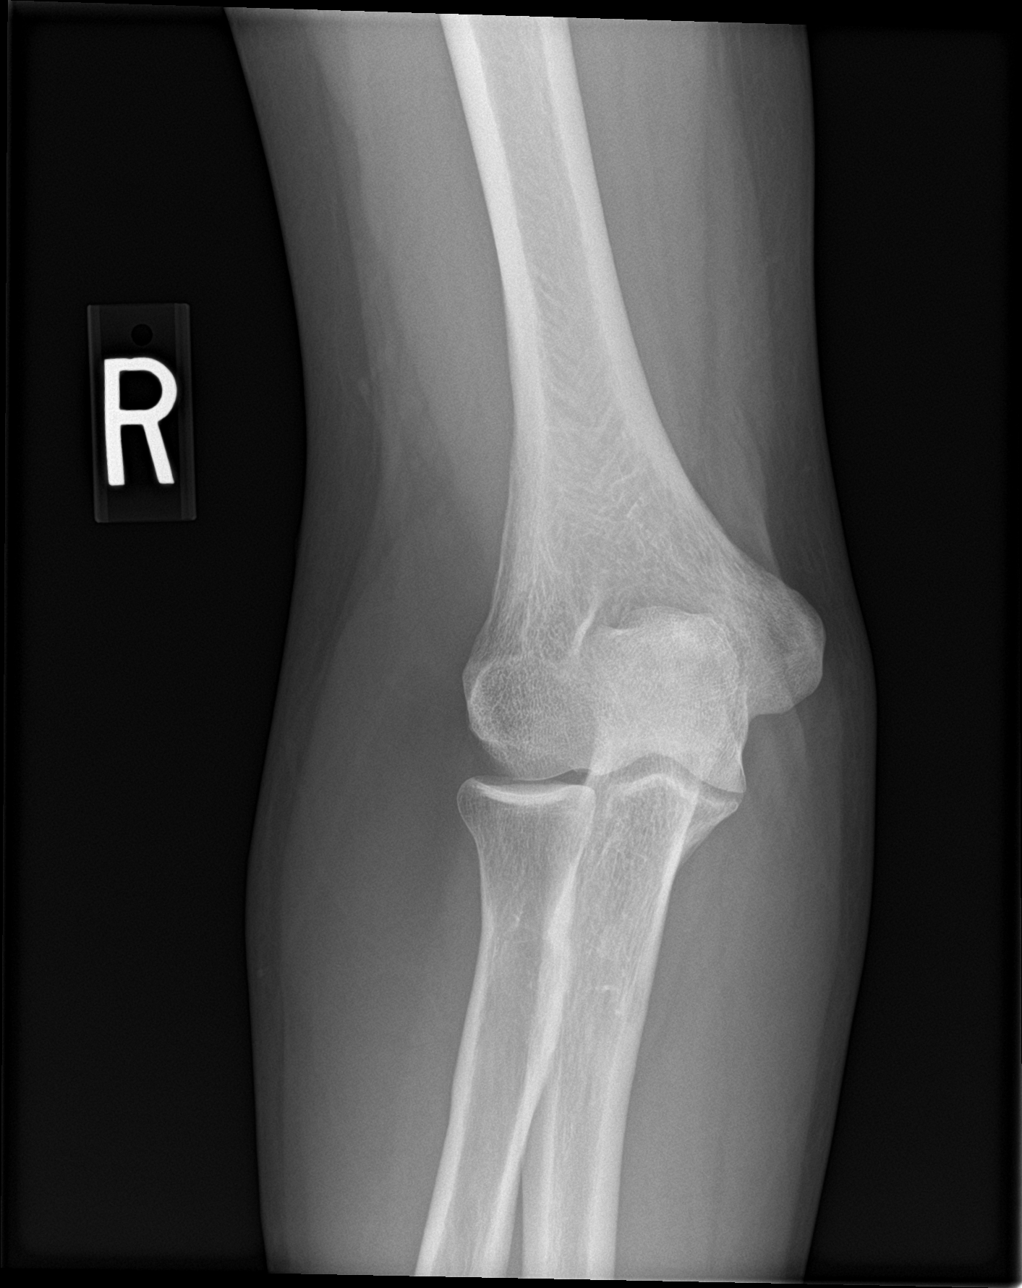

[elbow obl (1 of 2)]
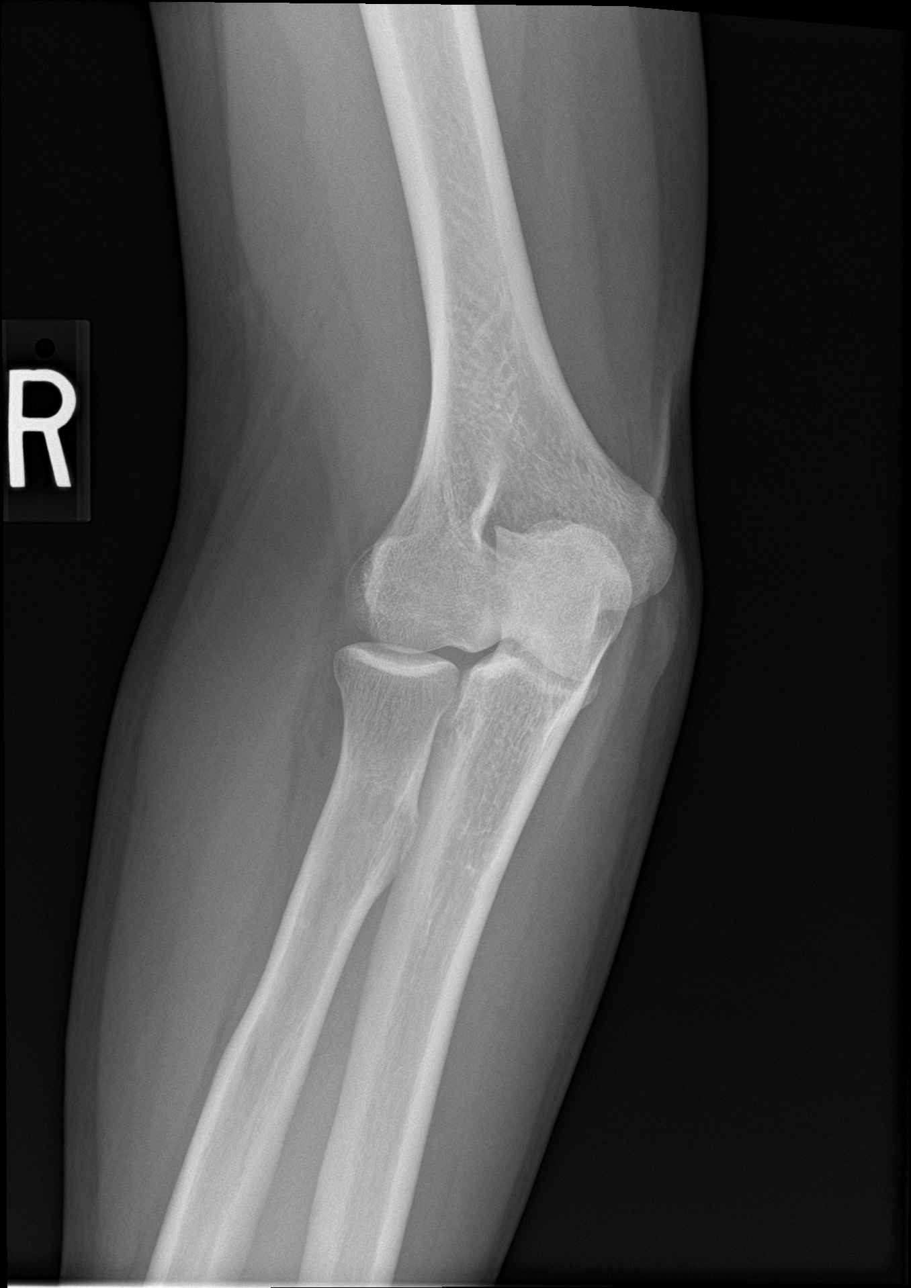

[elbow obl (2 of 2)]
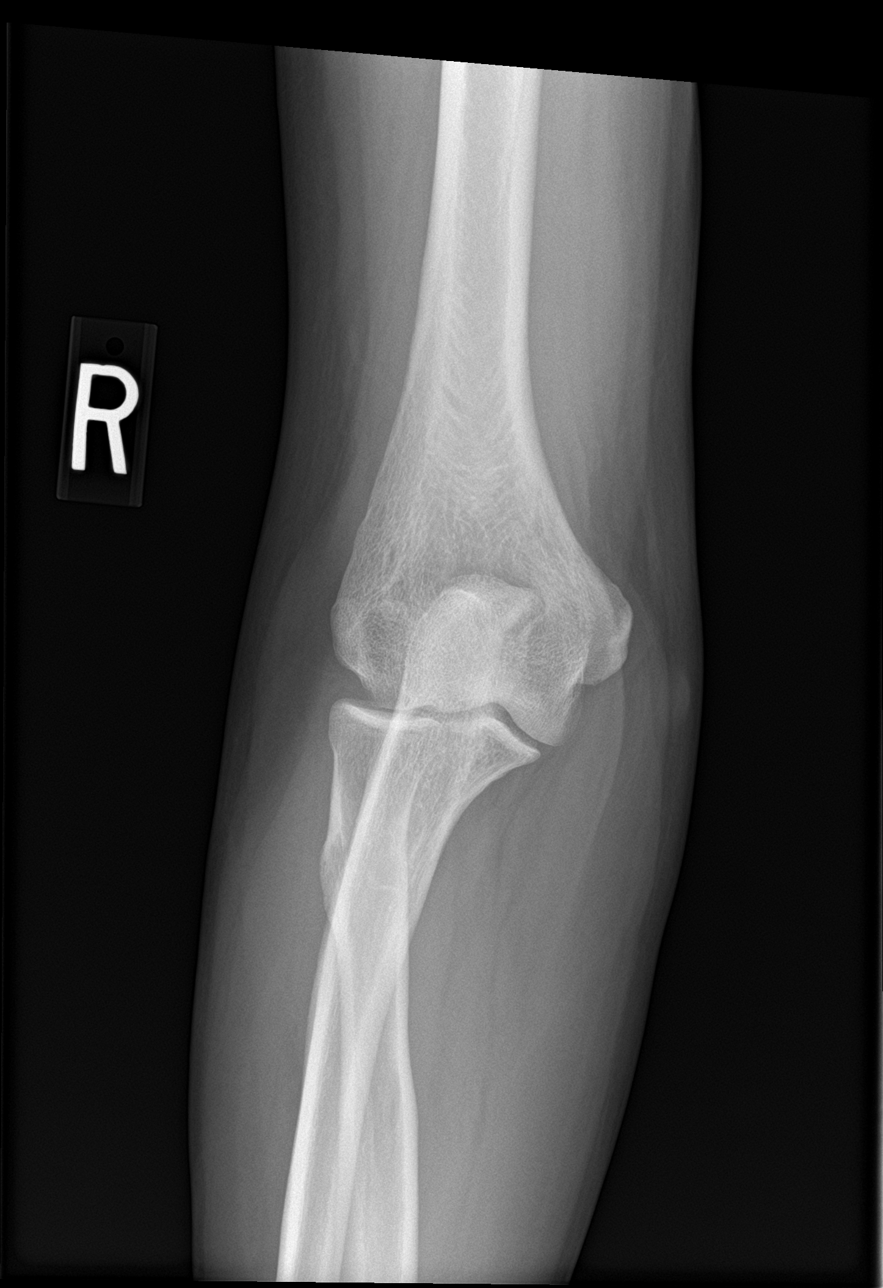

[elbow lat]
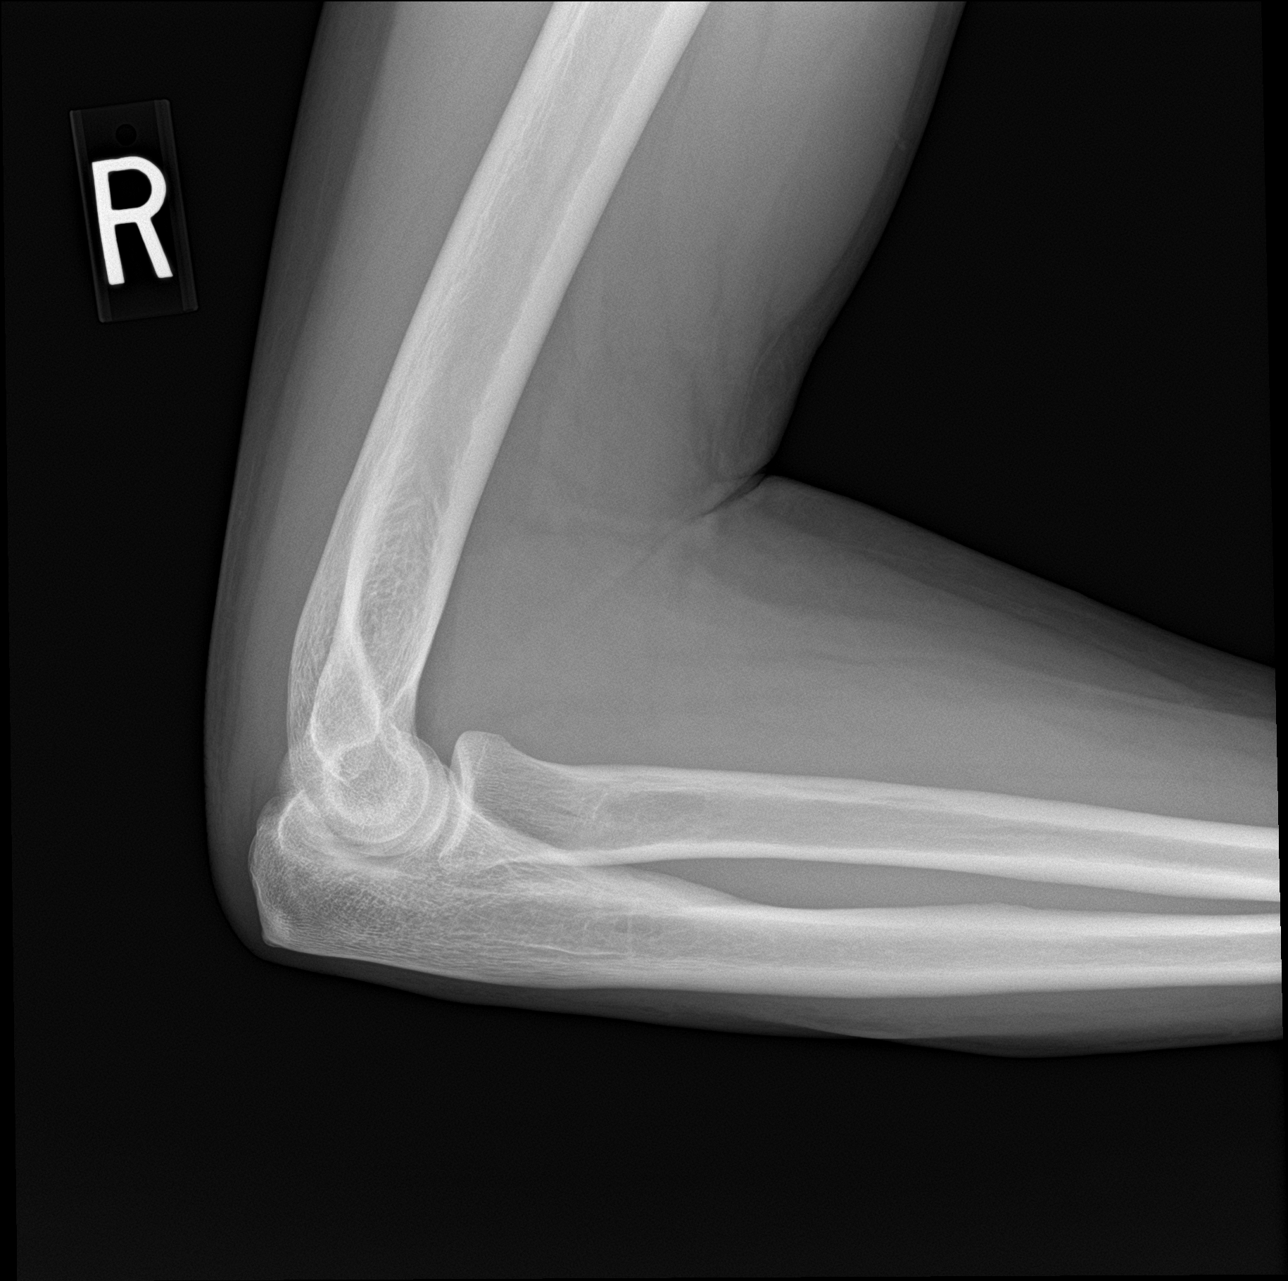

[4 of 4 positions shown; findings below may reference images not displayed]

FINDINGS: No acute soft tissue bony abnormality identified. No evidence of
fracture or dislocation.
IMPRESSION: No acute abnormality.

## 2019-07-27 NOTE — Assessment & Plan Note (Signed)
This is a very pleasant 44 year old male, he has a history of anaplastic astrocytoma of the right thalamus. He is overall doing well from this perspective, he has developed pain in his right arm with radiation up to the right shoulder. Worse with extension of the elbow, gripping objects. On exam he has tenderness at the distal biceps, with reproduction of pain with resisted supination. He also has some rotator cuff signs. He is currently on steroids for his brain tumor, so we will avoid NSAIDs for now, adding rehab exercises for his distal biceps and his rotator cuff, I would like to see him back in 2 weeks, I can do an injection if no better, hopefully the symptoms will declare themselves as either shoulder or elbow at that point.

## 2019-07-27 NOTE — Progress Notes (Signed)
    Procedures performed today:    None.  Independent interpretation of notes and tests performed by another provider:   None.  Brief History, Exam, Impression, and Recommendations:    Right arm pain This is a very pleasant 44 year old male, he has a history of anaplastic astrocytoma of the right thalamus. He is overall doing well from this perspective, he has developed pain in his right arm with radiation up to the right shoulder. Worse with extension of the elbow, gripping objects. On exam he has tenderness at the distal biceps, with reproduction of pain with resisted supination. He also has some rotator cuff signs. He is currently on steroids for his brain tumor, so we will avoid NSAIDs for now, adding rehab exercises for his distal biceps and his rotator cuff, I would like to see him back in 2 weeks, I can do an injection if no better, hopefully the symptoms will declare themselves as either shoulder or elbow at that point.    ___________________________________________ Gwen Her. Dianah Field, M.D., ABFM., CAQSM. Primary Care and [place] Instructor of [place] [facility] of Medicine

## 2019-07-29 ENCOUNTER — Other Ambulatory Visit: Payer: Self-pay | Admitting: Internal Medicine

## 2019-07-29 DIAGNOSIS — C710 Malignant neoplasm of cerebrum, except lobes and ventricles: Secondary | ICD-10-CM

## 2019-08-20 ENCOUNTER — Ambulatory Visit: Payer: BC Managed Care – PPO | Admitting: Sports Medicine

## 2019-08-20 ENCOUNTER — Other Ambulatory Visit: Payer: Self-pay

## 2019-08-20 ENCOUNTER — Ambulatory Visit
Admission: RE | Admit: 2019-08-20 | Discharge: 2019-08-20 | Disposition: A | Discharge status: Home / Self Care | Payer: BC Managed Care – PPO | Source: Ambulatory Visit | Attending: Internal Medicine | Admitting: Internal Medicine

## 2019-08-20 DIAGNOSIS — C710 Malignant neoplasm of cerebrum, except lobes and ventricles: Secondary | ICD-10-CM

## 2019-08-20 DIAGNOSIS — G9389 Other specified disorders of brain: Secondary | ICD-10-CM | POA: Diagnosis not present

## 2019-08-20 DIAGNOSIS — R220 Localized swelling, mass and lump, head: Secondary | ICD-10-CM | POA: Diagnosis not present

## 2019-08-20 DIAGNOSIS — C719 Malignant neoplasm of brain, unspecified: Secondary | ICD-10-CM | POA: Diagnosis not present

## 2019-08-20 DIAGNOSIS — Z982 Presence of cerebrospinal fluid drainage device: Secondary | ICD-10-CM | POA: Diagnosis not present

## 2019-08-20 IMAGING — MR MR HEAD WO/W CM
13 series · 48 of 48 positions shown · IV contrast (multihance)
Comparison: [DATE]

CLINICAL DATA: Anaplastic astrocytoma with interval radiation.

EXAM:
MRI HEAD WITHOUT AND WITH CONTRAST
TECHNIQUE: Multiplanar, multiecho pulse sequences of the brain and surrounding
structures were obtained without and with intravenous contrast.
CONTRAST:  17mL MULTIHANCE GADOBENATE DIMEGLUMINE 529 MG/ML IV SOLN

[Series 5: T1 · sagittal · 4.0mm · 0.75mm/px · 2 of 31 slices shown (1 of 3)]
[im 1/31]
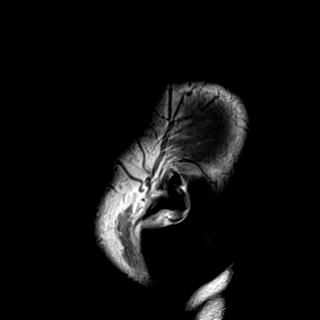
[im 31/31]
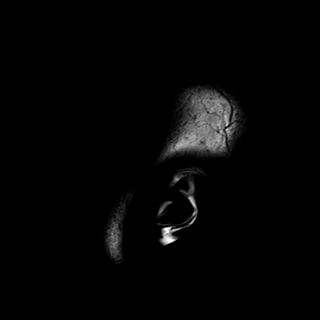

[Series 6: DWI · axial · 3.0mm · 1.44mm/px · z∈[-54,+91]mm · 5 of 92 slices shown (1 of 4)]
[im 1/92]
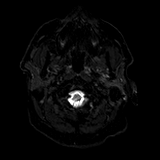
[im 23/92]
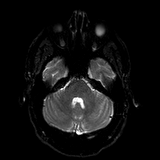
[im 46/92]
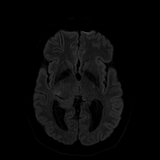
[im 69/92]
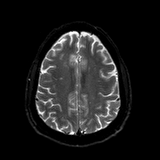
[im 92/92]
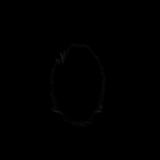

[Series 7: DWI · axial · 3.0mm · 1.44mm/px · z∈[-54,+91]mm · 3 of 46 slices shown (2 of 4)]
[im 1/46]
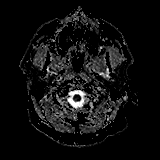
[im 23/46]
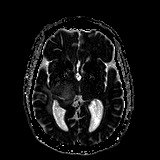
[im 46/46]
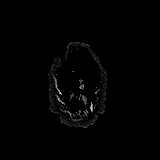

[Series 8: DWI · coronal · 5.0mm · 1.44mm/px · 4 of 72 slices shown (3 of 4)]
[im 1/72]
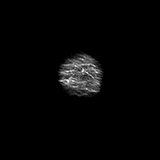
[im 24/72]
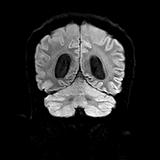
[im 48/72]
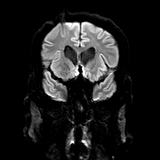
[im 72/72]
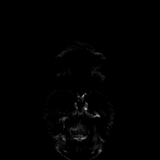

[Series 9: DWI · coronal · 5.0mm · 1.44mm/px · 2 of 36 slices shown (4 of 4)]
[im 1/36]
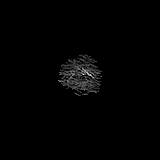
[im 36/36]
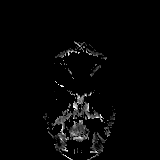

[Series 10: T2 · axial · 4.0mm · 0.36mm/px · z∈[-60,+93]mm · 2 of 31 slices shown]
[im 1/31]
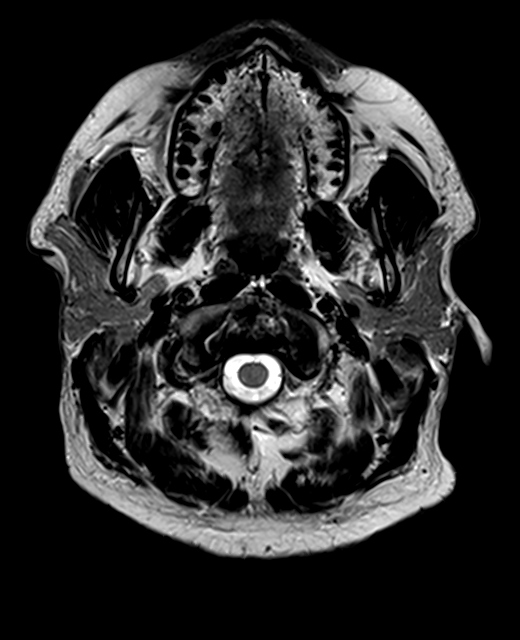
[im 31/31]
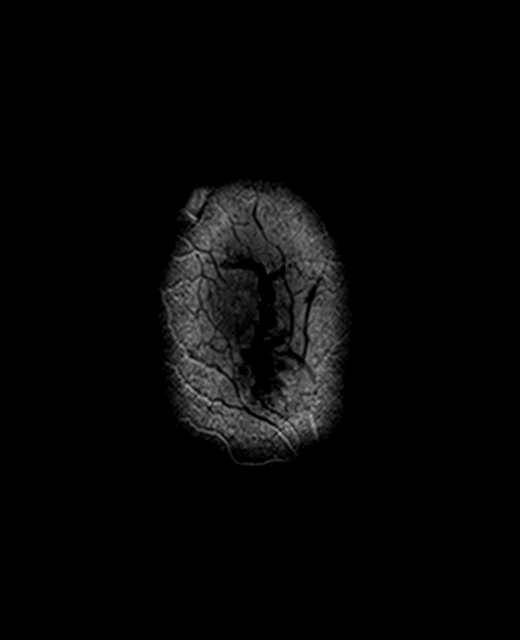

[Series 11: FLAIR · axial · 3.0mm · 0.72mm/px · 1 of 26 slices shown]
[im 1/26]
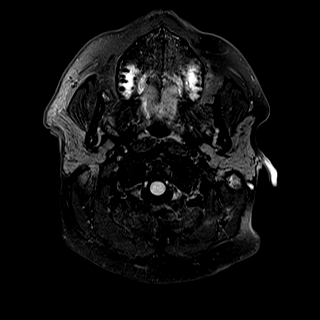

[Series 13: swi_images · axial · 1.5mm · 0.90mm/px · z∈[-54,+86]mm · 5 of 96 slices shown]
[im 1/96]
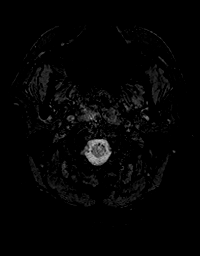
[im 24/96]
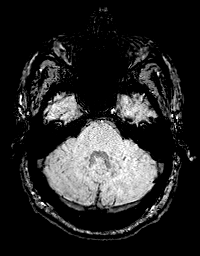
[im 48/96]
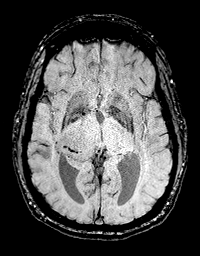
[im 72/96]
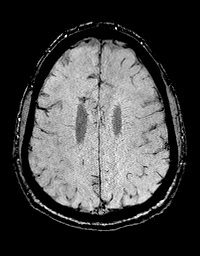
[im 96/96]
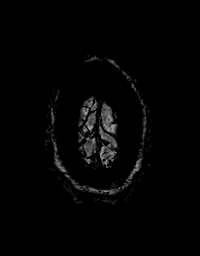

[Series 14: T1 · axial · 1.0mm · 0.94mm/px · z∈[-70,+88]mm · 9 of 160 slices shown (2 of 3)]
[im 1/160]
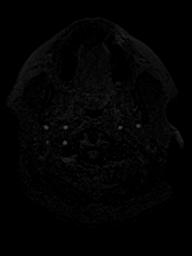
[im 20/160]
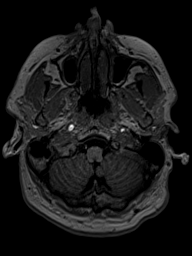
[im 40/160]
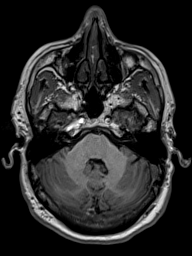
[im 60/160]
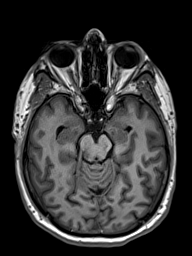
[im 80/160]
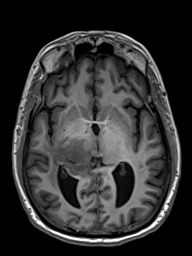
[im 100/160]
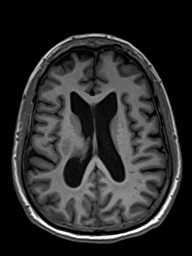
[im 120/160]
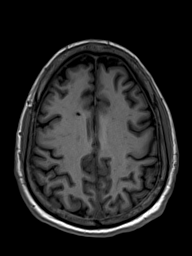
[im 140/160]
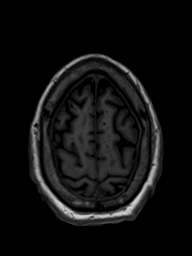
[im 160/160]
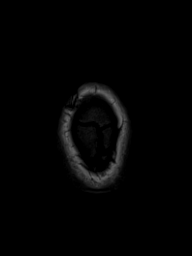

[Series 15: T2 post-contrast · coronal · 4.0mm · 0.36mm/px · 2 of 35 slices shown]
[im 1/35]
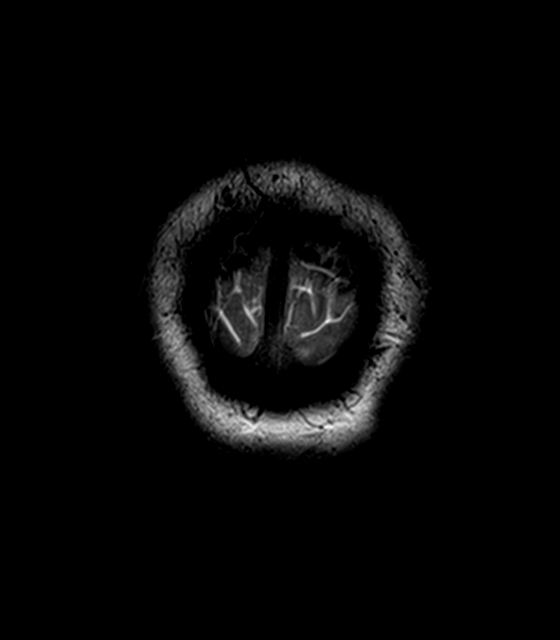
[im 35/35]
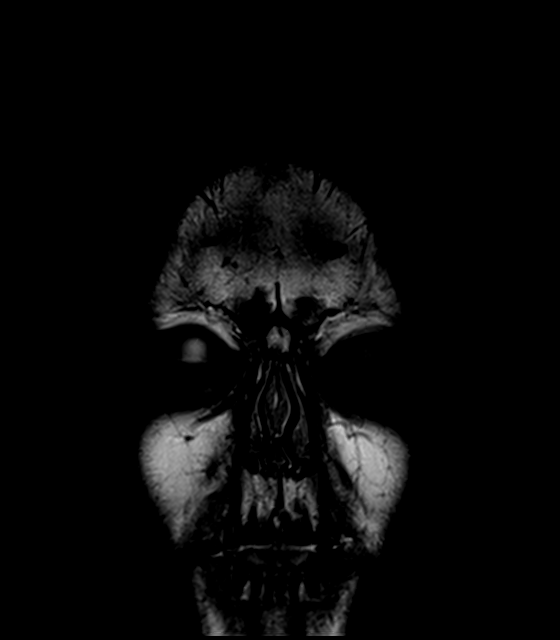

[Series 16: T1 · axial · 1.0mm · 0.94mm/px · z∈[-70,+88]mm · 9 of 160 slices shown (3 of 3)]
[im 1/160]
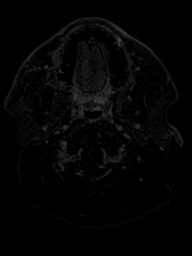
[im 20/160]
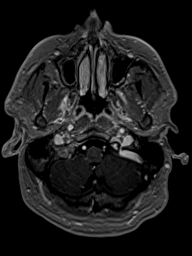
[im 40/160]
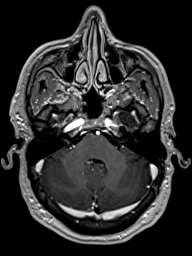
[im 60/160]
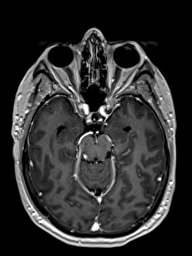
[im 80/160]
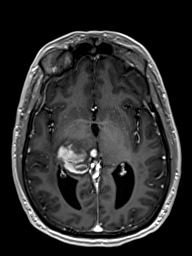
[im 100/160]
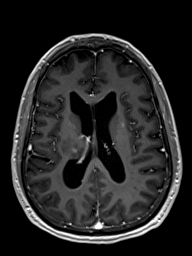
[im 120/160]
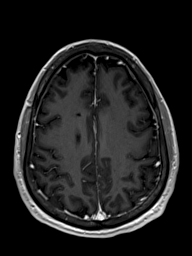
[im 140/160]
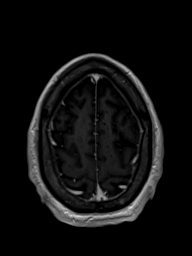
[im 160/160]
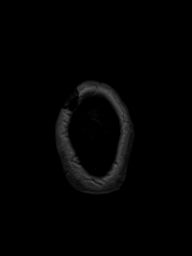

[Series 17: T1 post-contrast · coronal · 4.0mm · 0.72mm/px · 2 of 35 slices shown (1 of 2)]
[im 1/35]
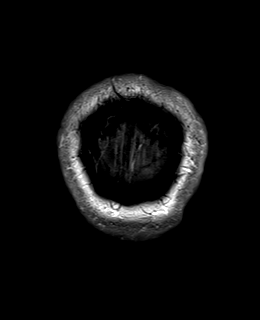
[im 35/35]
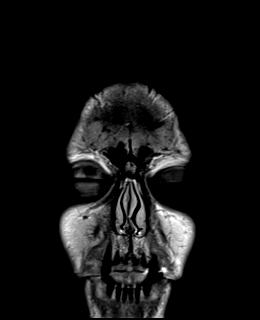

[Series 18: T1 post-contrast · sagittal · 4.0mm · 0.75mm/px · 2 of 31 slices shown (2 of 2)]
[im 1/31]
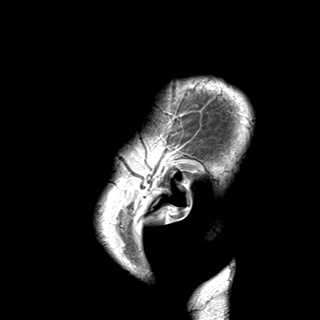
[im 31/31]
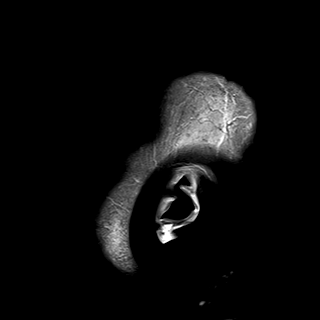

[48 of 48 positions shown; findings below may reference images not displayed]

FINDINGS: Brain: Increased mass in the right lateral midbrain and thalamus.
Measurement of the main thalamic mass shows increase in size from 3
cm to 3.3 cm. There is increased contrast enhancement as well,
particularly in the thalamic region. Much of the contrast
enhancement lower appears quite similar. No increase in regional
edema. Mass effect upon the cerebral aqueduct. No change in
ventricular size. No evidence of distant disease. No extra-axial
collection. Previous ventriculostomy track on the right.

Vascular: Major vessels at the base of the brain show flow.

Skull and upper cervical spine: Negative

Sinuses/Orbits: Clear/normal

Other: None
IMPRESSION: Evidence of disease worsening. Increased mass in the region of the
right lateral midbrain and thalamus, particularly notable in the
region of the thalamus. Tumor has increased in size from
approximately 3 cm to 3.3 cm. Increased enhancement, particularly in
the right thalamus.

## 2019-08-20 MED ORDER — GADOBENATE DIMEGLUMINE 529 MG/ML IV SOLN
17.0000 mL | Freq: Once | INTRAVENOUS | Status: AC | PRN
  Administered 2019-08-20: 17 mL via INTRAVENOUS

## 2019-08-23 ENCOUNTER — Inpatient Hospital Stay: Payer: BC Managed Care – PPO | Admitting: Internal Medicine

## 2019-08-23 ENCOUNTER — Inpatient Hospital Stay: Payer: BC Managed Care – PPO | Attending: Internal Medicine

## 2019-08-23 ENCOUNTER — Inpatient Hospital Stay: Payer: BC Managed Care – PPO

## 2019-08-23 DIAGNOSIS — Z923 Personal history of irradiation: Secondary | ICD-10-CM | POA: Insufficient documentation

## 2019-08-23 DIAGNOSIS — Z87891 Personal history of nicotine dependence: Secondary | ICD-10-CM | POA: Insufficient documentation

## 2019-08-23 DIAGNOSIS — Z9221 Personal history of antineoplastic chemotherapy: Secondary | ICD-10-CM | POA: Insufficient documentation

## 2019-08-23 DIAGNOSIS — R5383 Other fatigue: Secondary | ICD-10-CM | POA: Insufficient documentation

## 2019-08-23 DIAGNOSIS — R2689 Other abnormalities of gait and mobility: Secondary | ICD-10-CM | POA: Insufficient documentation

## 2019-08-23 DIAGNOSIS — N400 Enlarged prostate without lower urinary tract symptoms: Secondary | ICD-10-CM | POA: Insufficient documentation

## 2019-08-23 DIAGNOSIS — C710 Malignant neoplasm of cerebrum, except lobes and ventricles: Secondary | ICD-10-CM | POA: Insufficient documentation

## 2019-08-23 DIAGNOSIS — Z791 Long term (current) use of non-steroidal anti-inflammatories (NSAID): Secondary | ICD-10-CM | POA: Insufficient documentation

## 2019-08-24 ENCOUNTER — Inpatient Hospital Stay (HOSPITAL_BASED_OUTPATIENT_CLINIC_OR_DEPARTMENT_OTHER): Payer: BC Managed Care – PPO | Admitting: Internal Medicine

## 2019-08-24 ENCOUNTER — Inpatient Hospital Stay: Payer: BC Managed Care – PPO

## 2019-08-24 ENCOUNTER — Other Ambulatory Visit: Payer: Self-pay

## 2019-08-24 VITALS — BP 141/76 | HR 62 | Temp 98.1°F | Resp 18 | Wt 185.8 lb

## 2019-08-24 DIAGNOSIS — C710 Malignant neoplasm of cerebrum, except lobes and ventricles: Secondary | ICD-10-CM

## 2019-08-24 DIAGNOSIS — R2689 Other abnormalities of gait and mobility: Secondary | ICD-10-CM | POA: Diagnosis not present

## 2019-08-24 DIAGNOSIS — Z87891 Personal history of nicotine dependence: Secondary | ICD-10-CM | POA: Diagnosis not present

## 2019-08-24 DIAGNOSIS — R5383 Other fatigue: Secondary | ICD-10-CM | POA: Diagnosis not present

## 2019-08-24 DIAGNOSIS — Z791 Long term (current) use of non-steroidal anti-inflammatories (NSAID): Secondary | ICD-10-CM | POA: Diagnosis not present

## 2019-08-24 DIAGNOSIS — Z923 Personal history of irradiation: Secondary | ICD-10-CM | POA: Diagnosis not present

## 2019-08-24 DIAGNOSIS — N400 Enlarged prostate without lower urinary tract symptoms: Secondary | ICD-10-CM | POA: Diagnosis not present

## 2019-08-24 DIAGNOSIS — Z9221 Personal history of antineoplastic chemotherapy: Secondary | ICD-10-CM | POA: Diagnosis not present

## 2019-08-24 LAB — CMP (CANCER CENTER ONLY)
ALT: 17 U/L (ref 0–44)
AST: 16 U/L (ref 15–41)
Albumin: 4.1 g/dL (ref 3.5–5.0)
Alkaline Phosphatase: 56 U/L (ref 38–126)
Anion gap: 10 (ref 5–15)
BUN: 19 mg/dL (ref 6–20)
CO2: 28 mmol/L (ref 22–32)
Calcium: 10.5 mg/dL — ABNORMAL HIGH (ref 8.9–10.3)
Chloride: 100 mmol/L (ref 98–111)
Creatinine: 0.94 mg/dL (ref 0.61–1.24)
GFR, Est AFR Am: >60 mL/min (ref >60)
GFR, Estimated: >60 mL/min (ref >60)
Glucose, Bld: 70 mg/dL (ref 70–99)
Potassium: 4.3 mmol/L (ref 3.5–5.1)
Sodium: 138 mmol/L (ref 135–145)
Total Bilirubin: 1.1 mg/dL (ref 0.3–1.2)
Total Protein: 7.5 g/dL (ref 6.5–8.1)

## 2019-08-24 LAB — CBC WITH DIFFERENTIAL (CANCER CENTER ONLY)
Abs Immature Granulocytes: 0.05 10*3/uL (ref 0.00–0.07)
Basophils Absolute: 0 10*3/uL (ref 0.0–0.1)
Basophils Relative: 0 %
Eosinophils Absolute: 0.1 10*3/uL (ref 0.0–0.5)
Eosinophils Relative: 1 %
HCT: 48.1 % (ref 39.0–52.0)
Hemoglobin: 16.9 g/dL (ref 13.0–17.0)
Immature Granulocytes: 1 %
Lymphocytes Relative: 13 %
Lymphs Abs: 0.9 10*3/uL (ref 0.7–4.0)
MCH: 30.7 pg (ref 26.0–34.0)
MCHC: 35.1 g/dL (ref 30.0–36.0)
MCV: 87.3 fL (ref 80.0–100.0)
Monocytes Absolute: 0.7 10*3/uL (ref 0.1–1.0)
Monocytes Relative: 9 %
Neutro Abs: 5.3 10*3/uL (ref 1.7–7.7)
Neutrophils Relative %: 76 %
Platelet Count: 219 10*3/uL (ref 150–400)
RBC: 5.51 MIL/uL (ref 4.22–5.81)
RDW: 11.9 % (ref 11.5–15.5)
WBC Count: 6.9 10*3/uL (ref 4.0–10.5)
nRBC: 0 % (ref 0.0–0.2)

## 2019-08-24 MED ORDER — TEMOZOLOMIDE 20 MG PO CAPS: 20.0000 mg | ORAL_CAPSULE | Freq: Every day | ORAL | 0 refills | Status: DC

## 2019-08-24 MED ORDER — DEXAMETHASONE 2 MG PO TABS
4.0000 mg | ORAL_TABLET | Freq: Every day | ORAL | 1 refills | Status: DC
Start: 2019-08-24 — End: 2019-12-06

## 2019-08-24 MED ORDER — TEMOZOLOMIDE 100 MG PO CAPS: 100.0000 mg | ORAL_CAPSULE | Freq: Every day | ORAL | 0 refills | Status: AC

## 2019-08-24 MED ORDER — TEMOZOLOMIDE 140 MG PO CAPS: 280.0000 mg | ORAL_CAPSULE | Freq: Every day | ORAL | 0 refills | Status: DC

## 2019-08-24 NOTE — Progress Notes (Signed)
[facility] at [place], [place] [address] [phone]   Interval Evaluation  Date of Service: 08/24/19 Patient Name: Aaron Espinoza Patient MRN: [id number] Patient DOB: Apr 08, 1975 Provider: Ventura Sellers, MD  Identifying Statement:  Aaron Espinoza is a 44 y.o. male with right thalamic anaplastic astrocytoma   Oncologic History: Oncology History  Anaplastic astrocytoma of thalamus ([place])  03/17/2019 Surgery   Stereotactic biopsy by Dr. Marcello Moores; path demonstrates anaplastic astrocytoma   04/12/2019 - 05/21/2019 Radiation Therapy   IMRT with concurrent Temodar 2m/m2 daily   06/14/2019 -  Chemotherapy   The patient had dexamethasone (DECADRON) 4 MG tablet, 1 of 1 cycle, Start date: --, End date: -- temozolomide (TEMODAR) 5 MG capsule, 150 mg/m2/day = 305 mg, Oral, Daily, 1 of 1 cycle, Start date: --, End date: -- temozolomide (TEMODAR) 20 MG capsule, 20 mg, Oral, Daily, 1 of 1 cycle, Start date: 07/19/2019, End date: 07/24/2019 Dose modification: 20 mg (original dose 20 mg, Cycle 1) temozolomide (TEMODAR) 100 MG capsule, 100 mg (100 % of original dose 100 mg), Oral, Daily, 1 of 1 cycle, Start date: 07/19/2019, End date: 07/24/2019 Dose modification: 100 mg (original dose 100 mg, Cycle 1) temozolomide (TEMODAR) 140 MG capsule, 150 mg/m2/day = 280 mg, Oral, Daily, 1 of 1 cycle, Start date: --, End date: --  for chemotherapy treatment.      Biomarkers:  MGMT Unknown.  IDH 1/2 Wild type.  EGFR Unknown  TERT Unknown   Interval History:  Aaron YARDLEYpresents to clinic for follow up now having completed cycle #2 of 5-day TMZ.  He has experienced some recurrence of feeling imbalance with his walking, as he did priro to starting the decadron.  Fatigue is stable today. No other issues with chemotherapy.  Left visual impairment as prior. Maintains full functional independence otherwise.  Decadron has been dosing at 178mdaily.   H+P  (03/30/19) Patient presented to medical attention in late January, when he noticed mild confusion, gait imbalance.  These symptoms worsened over the next ~10 days, and were accompanied by incontinence.  Brain MRI was performed and demonstrated a right thalamic mass consistent with likely primary brain tumor.  He underwent stereotactic biopsy and ventriculostomy on 03/17/19 with Dr. ThMarcello Moores Following surgery he describes considerable improvement in most of his symptoms; at this time he is near baseline function but "very fatigued" overall.  No seizures or headaches since surgery.  Medications: Current Outpatient Medications on File Prior to Visit  Medication Sig Dispense Refill  . acetaminophen (TYLENOL) 500 MG tablet Take 1,000 mg by mouth every 6 (six) hours as needed.    . Marland Kitchenexamethasone (DECADRON) 2 MG tablet Take 0.5 tablets (1 mg total) by mouth 2 (two) times daily with a meal. 30 tablet 3  . docusate sodium (COLACE) 100 MG capsule Take 100 mg by mouth daily.    . Marland Kitchenbuprofen (ADVIL) 200 MG tablet Take 800 mg by mouth every 6 (six) hours as needed.    . ondansetron (ZOFRAN) 8 MG tablet Take 1 tablet (8 mg total) by mouth 2 (two) times daily as needed (nausea and vomiting). May take 30-60 minutes prior to Temodar administration if nausea/vomiting occurs. 30 tablet 1  . polyethylene glycol (MIRALAX / GLYCOLAX) 17 g packet Take 1 packet by mouth daily.     No current facility-administered medications on file prior to visit.    Allergies:  Allergies  Allergen Reactions  . Gadolinium  Derivatives Itching   Past Medical History:  Past Medical History:  Diagnosis Date  . BPH (benign prostatic hyperplasia) 07/20/2015  . Brain mass   . Cognitive dysfunction 01/2019  . Fatigue 01/2019  . Headache   . 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 HAND SURGERY Left    pinky - tumor benign  . LEG SURGERY Left     inner thigh tumor - benign  . VENTRICULOSTOMY N/A 03/17/2019   Procedure: ENDOSCOPIC THIRD VENTRICULOSTOMY;  Surgeon: Vallarie Mare, MD;  Location: [facility];  Service: Neurosurgery;  Laterality: N/A;  ENDOSCOPIC THIRD VENTRICULOSTOMY   Social History:  Social History   Socioeconomic History  . Marital status: Married    Spouse name: Not on file  . Number of children: Not on file  . Years of education: Not on file  . Highest education level: Not on file  Occupational History  . Not on file  Tobacco Use  . Smoking status: Former Smoker    Types: Cigars, Cigarettes  . Smokeless tobacco: Never Used  . Tobacco comment: Quit smoking cigarettes 25 yrs ago, quit cigars in 2020  Vaping Use  . Vaping Use: Never used  Substance and Sexual Activity  . Alcohol use: Yes    Alcohol/week: 6.0 - 9.0 standard drinks    Types: 6 - 9 Standard drinks or equivalent per week  . Drug use: No  . Sexual activity: Not on file  Other Topics Concern  . Not on file  Social History Narrative  . Not on file   Social Determinants of Health   Financial Resource Strain:   . Difficulty of Paying Living Expenses:   Food Insecurity:   . Worried About Charity fundraiser in the Last Year:   . Arboriculturist in the Last Year:   Transportation Needs:   . Film/video editor (Medical):   Marland Kitchen Lack of Transportation (Non-Medical):   Physical Activity:   . Days of Exercise per Week:   . Minutes of Exercise per Session:   Stress:   . Feeling of Stress :   Social Connections:   . Frequency of Communication with Friends and Family:   . Frequency of Social Gatherings with Friends and Family:   . Attends Religious Services:   . Active Member of Clubs or Organizations:   . Attends Archivist Meetings:   Marland Kitchen Marital Status:   Intimate Partner Violence:   . Fear of Current or Ex-Partner:   . Emotionally Abused:   Marland Kitchen Physically Abused:   . Sexually Abused:    Family History:  Family History   Problem Relation Age of Onset  . Cancer Paternal Grandfather        Prostate    Review of Systems: Constitutional: Doesn't report fevers, chills or abnormal weight loss Eyes: Doesn't report blurriness of vision Ears, nose, mouth, throat, and face: Doesn't report sore throat Respiratory: Doesn't report cough, dyspnea or wheezes Cardiovascular: Doesn't report palpitation, chest discomfort  Gastrointestinal:  Doesn't report nausea, constipation, diarrhea GU: Doesn't report incontinence Skin: Doesn't report skin rashes Neurological: Per HPI Musculoskeletal: Doesn't report joint pain Behavioral/Psych: Doesn't report anxiety  Physical Exam: Vitals:   08/24/19 1222  BP: (!) 141/76  Pulse: 62  Resp: 18  Temp: 98.1 F (36.7 C)  SpO2: 100%   KPS: 90. General: ++fatigue Head: Normal EENT: No conjunctival injection or scleral icterus.  Lungs: Resp effort normal Cardiac: Regular rate Abdomen: Non-distended abdomen Skin: No rashes cyanosis or petechiae. Extremities: No clubbing or edema  Neurologic Exam: Mental Status: Awake, alert, attentive to examiner. Oriented to self and environment. Language is fluent with intact comprehension.  Cranial Nerves: Visual acuity is grossly normal. L visual field impairment. Extra-ocular movements intact. No ptosis. Face is symmetric Motor: Tone and bulk are normal. Power is full in both arms and legs. Reflexes are symmetric, no pathologic reflexes present.  Sensory: Intact to light touch Gait: Normal.   Labs: I have reviewed the data as listed    Component Value Date/Time   NA 137 07/19/2019 1136   K 4.0 07/19/2019 1136   CL 102 07/19/2019 1136   CO2 24 07/19/2019 1136   GLUCOSE 101 (H) 07/19/2019 1136   BUN 14 07/19/2019 1136   CREATININE 0.81 07/19/2019 1136   CREATININE 0.83 03/04/2019 1206   CALCIUM 9.4 07/19/2019 1136   PROT 7.0 07/19/2019 1136   ALBUMIN 3.9 07/19/2019 1136   AST 10 (L) 07/19/2019 1136   ALT 15 07/19/2019 1136    ALKPHOS 58 07/19/2019 1136   BILITOT 0.6 07/19/2019 1136   GFRNONAA >60 07/19/2019 1136   GFRNONAA 106 03/04/2019 1206   GFRAA >60 07/19/2019 1136   GFRAA 123 03/04/2019 1206   Lab Results  Component Value Date   WBC 6.9 08/24/2019   NEUTROABS 5.3 08/24/2019   HGB 16.9 08/24/2019   HCT 48.1 08/24/2019   MCV 87.3 08/24/2019   PLT 219 08/24/2019   Imaging:  [facility] Clinician Interpretation: I have personally reviewed the CNS images as listed.  My interpretation, in the context of the patient's clinical presentation, is treatment effect vs true progression  DG Shoulder Right  Result Date: 07/29/2019 CLINICAL DATA:  Right shoulder pain.  No known injury. EXAM: RIGHT SHOULDER - 2+ VIEW COMPARISON:  Chest x-ray 11/20/2016. FINDINGS: No acute bony or joint abnormality identified. No evidence of fracture or dislocation. No evidence of separation. IMPRESSION: No acute bony or joint abnormality identified. Electronically Signed   By: Marcello Moores  Register   On: 07/29/2019 06:34   DG ELBOW COMPLETE RIGHT (3+VIEW)  Result Date: 07/29/2019 CLINICAL DATA:  Right elbow pain. EXAM: RIGHT ELBOW - COMPLETE 3+ VIEW COMPARISON:  No prior. FINDINGS: No acute soft tissue bony abnormality identified. No evidence of fracture or dislocation. IMPRESSION: No acute abnormality. Electronically Signed   By: Marcello Moores  Register   On: 07/29/2019 06:38   MR BRAIN W WO CONTRAST  Result Date: 08/23/2019 CLINICAL DATA:  Anaplastic astrocytoma with interval radiation. EXAM: MRI HEAD WITHOUT AND WITH CONTRAST TECHNIQUE: Multiplanar, multiecho pulse sequences of the brain and surrounding structures were obtained without and with intravenous contrast. CONTRAST:  35m MULTIHANCE GADOBENATE DIMEGLUMINE 529 MG/ML IV SOLN COMPARISON:  06/11/2019 FINDINGS: Brain: Increased mass in the right lateral midbrain and thalamus. Measurement of the main thalamic mass shows increase in size from 3 cm to 3.3 cm. There is increased contrast  enhancement as well, particularly in the thalamic region. Much of the contrast enhancement lower appears quite similar. No increase in regional edema. Mass effect upon the cerebral aqueduct. No change in ventricular size. No evidence of distant disease. No extra-axial collection. Previous ventriculostomy track on the right. Vascular: Major vessels at the base of the brain show flow. Skull and upper cervical spine: Negative Sinuses/Orbits: Clear/normal Other: None IMPRESSION: Evidence of disease worsening. Increased mass in the region of the right lateral midbrain and thalamus, particularly notable in the  region of the thalamus. Tumor has increased in size from approximately 3 cm to 3.3 cm. Increased enhancement, particularly in the right thalamus. Electronically Signed   By: Nelson Chimes M.D.   On: 08/23/2019 08:55   Assessment/Plan Anaplastic astrocytoma of thalamus ([place]) [C71.0]  Aaron Espinoza is clinically stable, now having completed cycle #2 5-day Temozolomide at 241m/m2 dose level.  MRI demonstrates progression of disease which represents either radio-inflammatory process or organic tumor progression.  Prior response to decadron (currently only on 1530mdaily), focus of progression within high dose RT field, ~3 months following RT timeframe, and overall histology/genetics are favorable for inflammatory process.    We recommended continuing treatment with cycle #3 Temozolomide increased to 200 mg/m2, on for five days and off for twenty three days in twenty eight day cycles. The patient will have a complete blood count performed on days 21 and 28 of each cycle, and a comprehensive metabolic panel performed on day 28 of each cycle. Labs may need to be performed more often. Zofran will prescribed for home use for nausea/vomiting.   Chemotherapy should be held for the following:  ANC less than 1,000  Platelets less than 100,000  LFT or creatinine greater than 2x ULN  If clinical  concerns/contraindications develop  Given radiographic findings today, we ask that Aaron SHREVEeturn to clinic in 1 months following next brain MRI, or sooner as needed.  If further progression noted we will consider transitioning to second line therapy or clinical trial.   We are ok with him increasing decadron to 30m22maily if he finds it helpful with his subjective gait impairment.  We appreciate the opportunity to participate in the care of Aaron Espinoza All questions were answered. The patient knows to call the clinic with any problems, questions or concerns. No barriers to learning were detected.  The total time spent in the encounter was 40 minutes and more than 50% was on counseling and review of test results   ZacVentura SellersD Medical Director of Neuro-Oncology [facility] [place]/20/21 12:23 PM

## 2019-08-25 ENCOUNTER — Telehealth: Payer: Self-pay | Admitting: Internal Medicine

## 2019-08-25 NOTE — Telephone Encounter (Signed)
Scheduled per 7/20 los. Spoke with pt's wife and is aware of appts on 8/16.

## 2019-08-26 ENCOUNTER — Other Ambulatory Visit: Payer: Self-pay | Admitting: Internal Medicine

## 2019-08-26 DIAGNOSIS — C710 Malignant neoplasm of cerebrum, except lobes and ventricles: Secondary | ICD-10-CM

## 2019-08-30 NOTE — Telephone Encounter (Signed)
Refill request

## 2019-09-01 ENCOUNTER — Other Ambulatory Visit: Payer: Self-pay | Admitting: Radiation Therapy

## 2019-09-03 ENCOUNTER — Other Ambulatory Visit: Payer: Self-pay | Admitting: Internal Medicine

## 2019-09-13 ENCOUNTER — Other Ambulatory Visit: Payer: BC Managed Care – PPO

## 2019-09-14 ENCOUNTER — Other Ambulatory Visit: Payer: BC Managed Care – PPO

## 2019-09-14 ENCOUNTER — Telehealth: Payer: Self-pay

## 2019-09-14 ENCOUNTER — Ambulatory Visit (INDEPENDENT_AMBULATORY_CARE_PROVIDER_SITE_OTHER): Payer: BC Managed Care – PPO

## 2019-09-14 ENCOUNTER — Other Ambulatory Visit: Payer: Self-pay

## 2019-09-14 DIAGNOSIS — C719 Malignant neoplasm of brain, unspecified: Secondary | ICD-10-CM | POA: Diagnosis not present

## 2019-09-14 DIAGNOSIS — C710 Malignant neoplasm of cerebrum, except lobes and ventricles: Secondary | ICD-10-CM | POA: Diagnosis not present

## 2019-09-14 DIAGNOSIS — R220 Localized swelling, mass and lump, head: Secondary | ICD-10-CM | POA: Diagnosis not present

## 2019-09-14 DIAGNOSIS — G9389 Other specified disorders of brain: Secondary | ICD-10-CM | POA: Diagnosis not present

## 2019-09-14 IMAGING — MR MR HEAD WO/W CM
13 series · 48 of 48 positions shown · IV contrast (gadavist)
Comparison: [DATE] MRI head.

CLINICAL DATA: Brain/CNS neoplasm.

EXAM:
MRI HEAD WITHOUT AND WITH CONTRAST
TECHNIQUE: Multiplanar, multiecho pulse sequences of the brain and surrounding
structures were obtained without and with intravenous contrast.
CONTRAST:  10mL GADAVIST GADOBUTROL 1 MMOL/ML IV SOLN

[Series 3: DWI · axial · 3.0mm · 1.20mm/px · z∈[-6,+153]mm · 6 of 108 slices shown (1 of 4)]
[im 1/108]
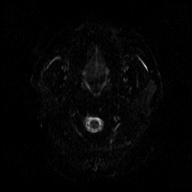
[im 22/108]
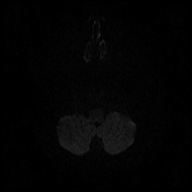
[im 43/108]
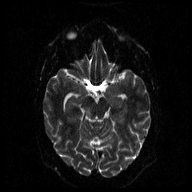
[im 65/108]
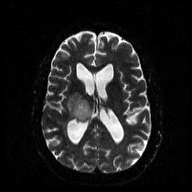
[im 86/108]
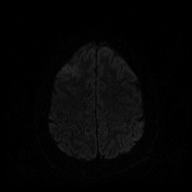
[im 108/108]
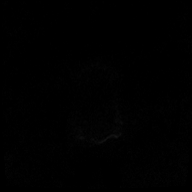

[Series 4: DWI · axial · 3.0mm · 1.20mm/px · z∈[-6,+153]mm · 3 of 55 slices shown (2 of 4)]
[im 1/55]
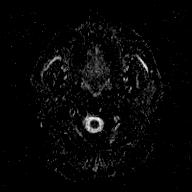
[im 28/55]
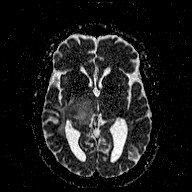
[im 55/55]
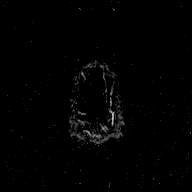

[Series 5: DWI · coronal · 3.0mm · 1.15mm/px · 5 of 98 slices shown (3 of 4)]
[im 1/98]
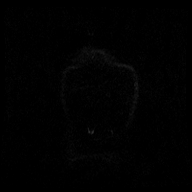
[im 25/98]
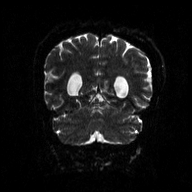
[im 49/98]
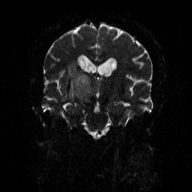
[im 73/98]
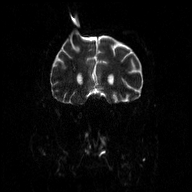
[im 98/98]
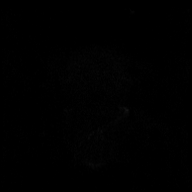

[Series 6: DWI · coronal · 3.0mm · 1.15mm/px · 3 of 49 slices shown (4 of 4)]
[im 1/49]
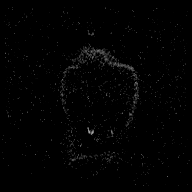
[im 25/49]
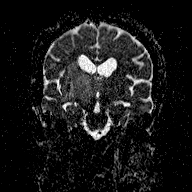
[im 49/49]
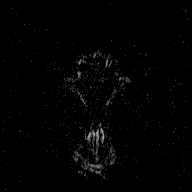

[Series 7: T1 · sagittal · 5.0mm · 0.47mm/px · 1 of 23 slices shown (1 of 2)]
[im 1/23]
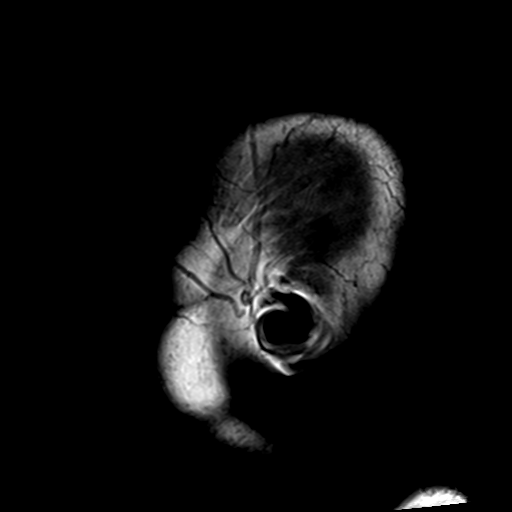

[Series 8: T2 · axial · 3.0mm · 0.72mm/px · z∈[-8,+157]mm · 3 of 57 slices shown (1 of 2)]
[im 1/57]
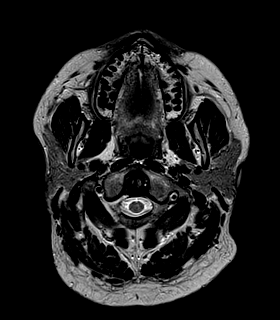
[im 29/57]
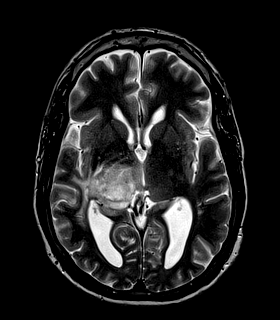
[im 57/57]
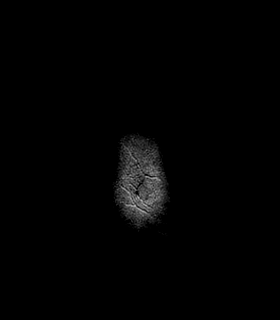

[Series 9: FLAIR · axial · 3.0mm · 0.45mm/px · z∈[-5,+154]mm · 3 of 55 slices shown]
[im 1/55]
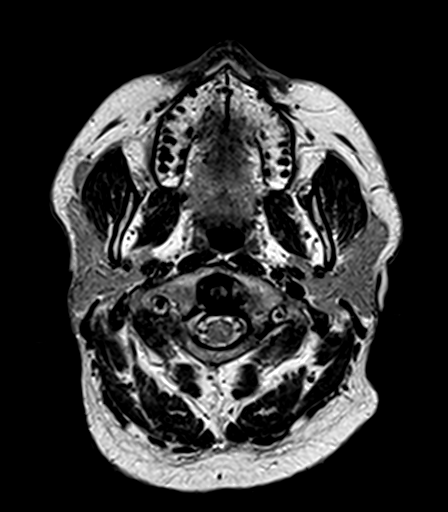
[im 28/55]
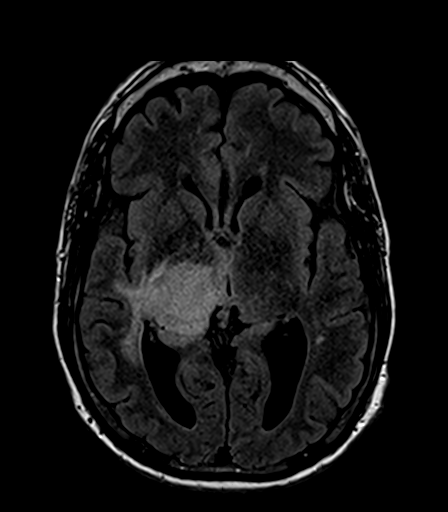
[im 55/55]
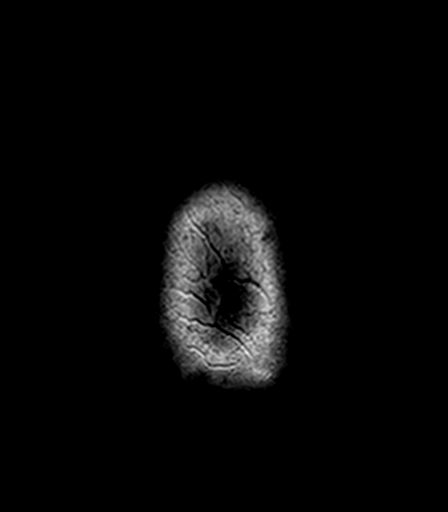

[Series 10: T2 · axial · 3.0mm · 0.72mm/px · z∈[-8,+157]mm · 3 of 57 slices shown (2 of 2)]
[im 1/57]
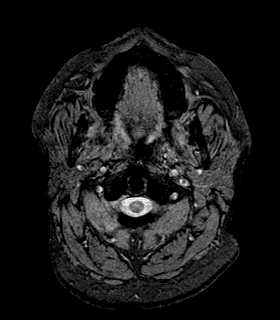
[im 29/57]
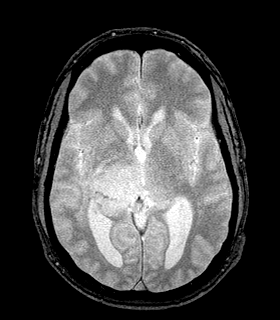
[im 57/57]
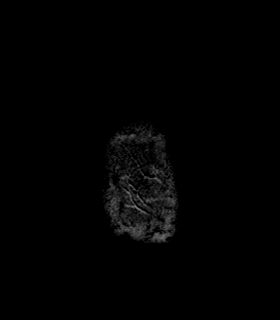

[Series 13: T1 · axial · 1.0mm · 1.00mm/px · z∈[-47,+108]mm · 8 of 160 slices shown (2 of 2)]
[im 1/160]
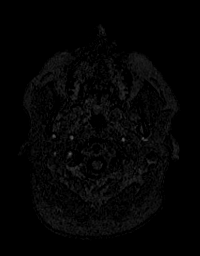
[im 23/160]
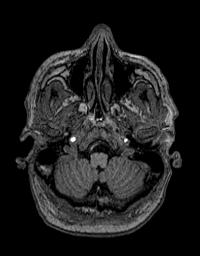
[im 46/160]
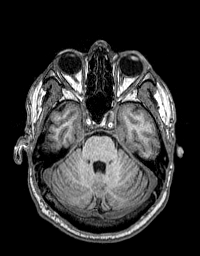
[im 69/160]
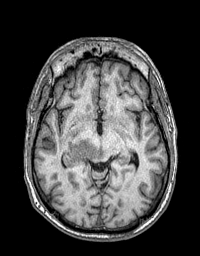
[im 91/160]
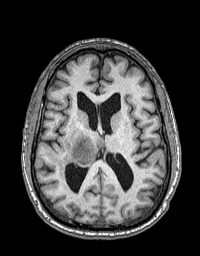
[im 114/160]
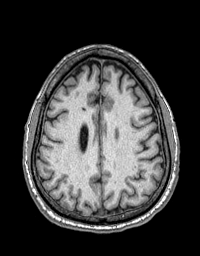
[im 137/160]
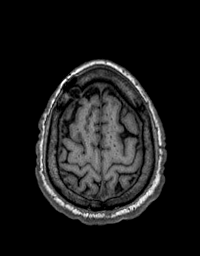
[im 160/160]
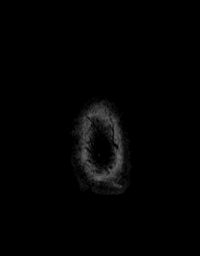

[Series 14: T2 post-contrast · coronal · 5.0mm · 0.43mm/px · 2 of 31 slices shown]
[im 1/31]
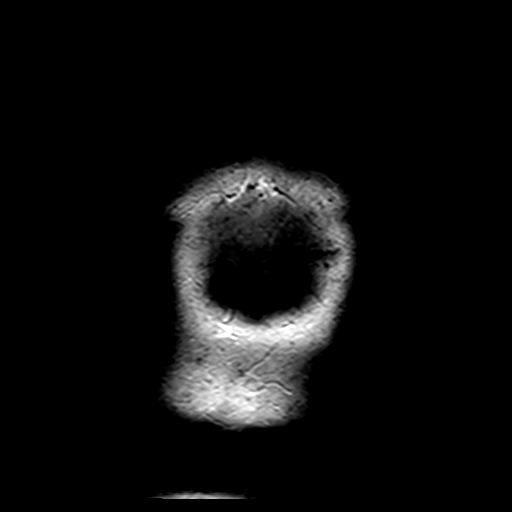
[im 31/31]
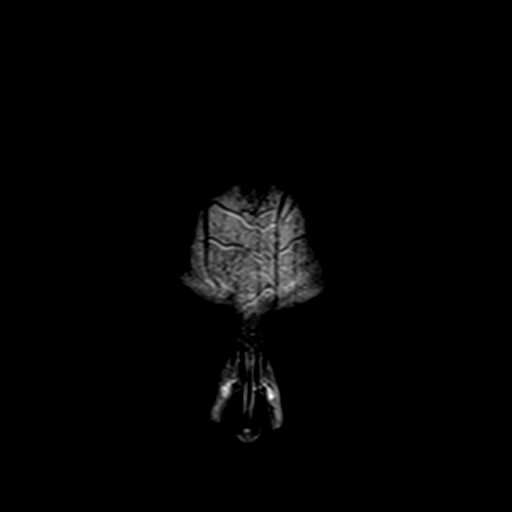

[Series 15: T1 post-contrast · axial · 1.0mm · 1.00mm/px · z∈[-47,+108]mm · 8 of 160 slices shown (1 of 3)]
[im 1/160]
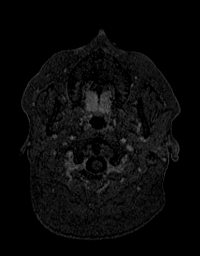
[im 23/160]
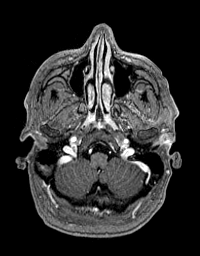
[im 46/160]
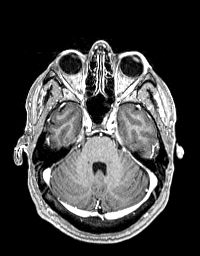
[im 69/160]
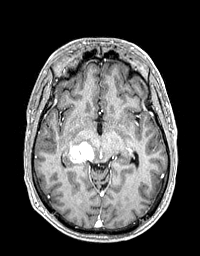
[im 91/160]
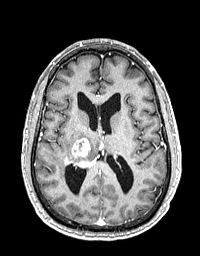
[im 114/160]
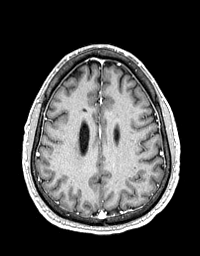
[im 137/160]
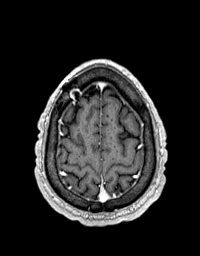
[im 160/160]
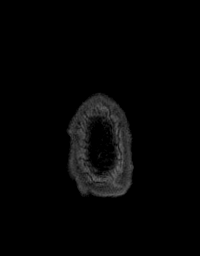

[Series 16: T1 post-contrast · coronal · 5.0mm · 0.43mm/px · 2 of 31 slices shown (2 of 3)]
[im 1/31]
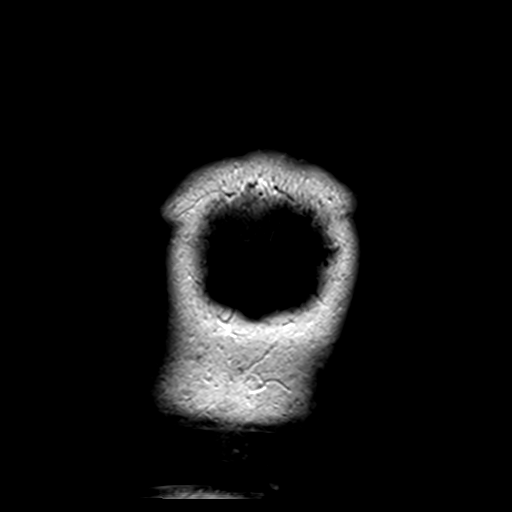
[im 31/31]
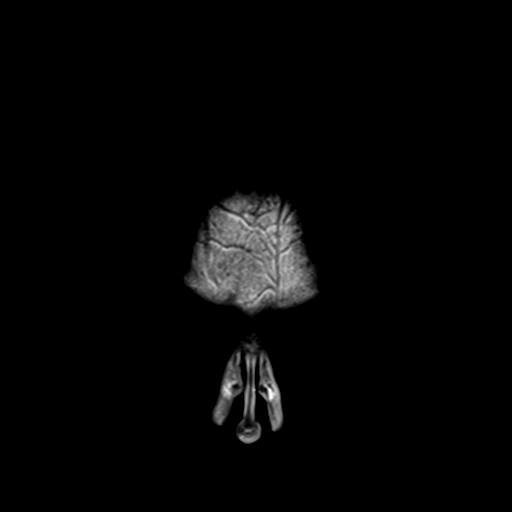

[Series 17: T1 post-contrast · sagittal · 5.0mm · 0.47mm/px · 1 of 23 slices shown (3 of 3)]
[im 1/23]
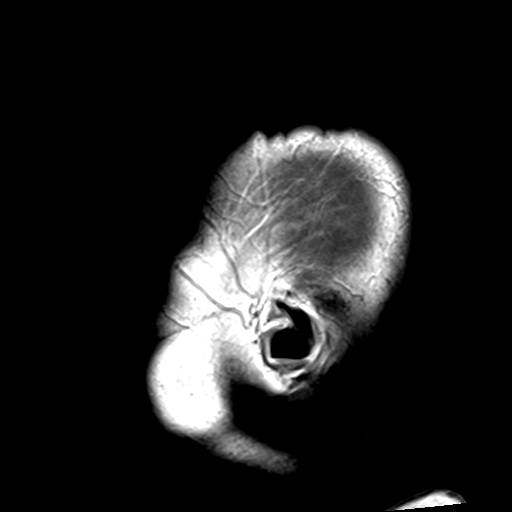

[48 of 48 positions shown; findings below may reference images not displayed]

FINDINGS: Brain: Right lateral midbrain and thalamic mass measuring 3.3 cm
([DATE], previously 3.3 cm) is unchanged in size. Associated
heterogenous enhancement is slightly more conspicuous within the
thalamus when compared on coronal post con images ([DATE]).
Perilesional T2/FLAIR hyperintense signal demonstrates increased
involvement of the right corona radiata.

Slight 1-2 mm leftward midline shift at the level of the thalamus
with narrowing of the proximal cerebral aqueduct and third ventricle
is unchanged. Stable caliber of the ventricular system. No
extra-axial fluid collection. Prior right frontal ventricular
drainage catheter tract.

Vascular: Normal flow voids.

Skull and upper cervical spine: Normal marrow signal.

Sinuses/Orbits: Normal orbits. Clear paranasal sinuses. No mastoid
effusion.

Other: None.
IMPRESSION: Stable size of right thalamic/midbrain mass measuring 3.3 cm. Mildly
increased heterogenous enhancement within the thalamus.

Perilesional T2/FLAIR hyperintense signal demonstrates mildly
increased involvement of the corona radiata.

## 2019-09-14 MED ORDER — GADOBUTROL 1 MMOL/ML IV SOLN
8.0000 mL | Freq: Once | INTRAVENOUS | Status: AC | PRN
  Administered 2019-09-14: 10 mL via INTRAVENOUS

## 2019-09-14 NOTE — Telephone Encounter (Signed)
[facility] at [facility] called stating they needed to speak to Dr.Vaslow concerning pt previous reaction to contrast and pre procedure orders center(helen nurse) faxed over instructions and asked for MD to call to clarification pt is scheduled today at 1330 for MRI

## 2019-09-16 ENCOUNTER — Telehealth: Payer: Self-pay | Admitting: *Deleted

## 2019-09-16 ENCOUNTER — Other Ambulatory Visit: Payer: Self-pay | Admitting: Internal Medicine

## 2019-09-16 NOTE — Telephone Encounter (Signed)
Spoke with Adele Dan, pharmacy tech @ [facility]   Confirmed patient got shipment of Temodar 400 mg ordered on 08/24/2019, Shipped out on 08/25/2019.  Per there system a duplicate with lower dose of Temodar 300 mg was sent electronically on 08/30/2019 and that was what they were calling to clarify.   Instructed them to cancel this order and after patient is seen in office, Dr. Mickeal Skinner will order new dose at that time.

## 2019-09-20 ENCOUNTER — Inpatient Hospital Stay: Payer: BC Managed Care – PPO

## 2019-09-20 ENCOUNTER — Inpatient Hospital Stay: Payer: BC Managed Care – PPO | Attending: Internal Medicine | Admitting: Internal Medicine

## 2019-09-20 ENCOUNTER — Other Ambulatory Visit: Payer: Self-pay

## 2019-09-20 VITALS — BP 147/91 | HR 48 | Temp 97.5°F | Resp 18 | Ht 72.0 in | Wt 191.2 lb

## 2019-09-20 DIAGNOSIS — Z923 Personal history of irradiation: Secondary | ICD-10-CM | POA: Insufficient documentation

## 2019-09-20 DIAGNOSIS — Q8500 Neurofibromatosis, unspecified: Secondary | ICD-10-CM | POA: Diagnosis not present

## 2019-09-20 DIAGNOSIS — N400 Enlarged prostate without lower urinary tract symptoms: Secondary | ICD-10-CM | POA: Diagnosis not present

## 2019-09-20 DIAGNOSIS — Z9221 Personal history of antineoplastic chemotherapy: Secondary | ICD-10-CM | POA: Insufficient documentation

## 2019-09-20 DIAGNOSIS — Z79899 Other long term (current) drug therapy: Secondary | ICD-10-CM | POA: Diagnosis not present

## 2019-09-20 DIAGNOSIS — Z87891 Personal history of nicotine dependence: Secondary | ICD-10-CM | POA: Diagnosis not present

## 2019-09-20 DIAGNOSIS — C710 Malignant neoplasm of cerebrum, except lobes and ventricles: Secondary | ICD-10-CM

## 2019-09-20 DIAGNOSIS — Z7952 Long term (current) use of systemic steroids: Secondary | ICD-10-CM | POA: Diagnosis not present

## 2019-09-20 LAB — CBC WITH DIFFERENTIAL (CANCER CENTER ONLY)
Abs Immature Granulocytes: 0.16 10*3/uL — ABNORMAL HIGH (ref 0.00–0.07)
Basophils Absolute: 0 10*3/uL (ref 0.0–0.1)
Basophils Relative: 0 %
Eosinophils Absolute: 0 10*3/uL (ref 0.0–0.5)
Eosinophils Relative: 1 %
HCT: 46.1 % (ref 39.0–52.0)
Hemoglobin: 16.1 g/dL (ref 13.0–17.0)
Immature Granulocytes: 2 %
Lymphocytes Relative: 24 %
Lymphs Abs: 1.7 10*3/uL (ref 0.7–4.0)
MCH: 31.3 pg (ref 26.0–34.0)
MCHC: 34.9 g/dL (ref 30.0–36.0)
MCV: 89.7 fL (ref 80.0–100.0)
Monocytes Absolute: 0.9 10*3/uL (ref 0.1–1.0)
Monocytes Relative: 13 %
Neutro Abs: 4.2 10*3/uL (ref 1.7–7.7)
Neutrophils Relative %: 60 %
Platelet Count: 223 10*3/uL (ref 150–400)
RBC: 5.14 MIL/uL (ref 4.22–5.81)
RDW: 12.8 % (ref 11.5–15.5)
WBC Count: 7 10*3/uL (ref 4.0–10.5)
nRBC: 0 % (ref 0.0–0.2)

## 2019-09-20 LAB — CMP (CANCER CENTER ONLY)
ALT: 16 U/L (ref 0–44)
AST: 11 U/L — ABNORMAL LOW (ref 15–41)
Albumin: 3.9 g/dL (ref 3.5–5.0)
Alkaline Phosphatase: 48 U/L (ref 38–126)
Anion gap: 11 (ref 5–15)
BUN: 12 mg/dL (ref 6–20)
CO2: 27 mmol/L (ref 22–32)
Calcium: 10.1 mg/dL (ref 8.9–10.3)
Chloride: 99 mmol/L (ref 98–111)
Creatinine: 0.88 mg/dL (ref 0.61–1.24)
GFR, Est AFR Am: >60 mL/min (ref >60)
GFR, Estimated: >60 mL/min (ref >60)
Glucose, Bld: 88 mg/dL (ref 70–99)
Potassium: 4 mmol/L (ref 3.5–5.1)
Sodium: 137 mmol/L (ref 135–145)
Total Bilirubin: 1 mg/dL (ref 0.3–1.2)
Total Protein: 7.1 g/dL (ref 6.5–8.1)

## 2019-09-20 MED ORDER — TEMOZOLOMIDE 100 MG PO CAPS: 400.0000 mg | ORAL_CAPSULE | Freq: Every day | ORAL | 0 refills | Status: AC

## 2019-09-20 MED ORDER — PANTOPRAZOLE SODIUM 40 MG PO TBEC
40.0000 mg | DELAYED_RELEASE_TABLET | Freq: Every day | ORAL | 3 refills | Status: DC
Start: 2019-09-20 — End: 2019-11-12

## 2019-09-20 NOTE — Progress Notes (Signed)
[facility] at [place], [place] [address] [phone]   Interval Evaluation  Date of Service: 09/20/19 Patient Name: Aaron Espinoza Patient MRN: [id number] Patient DOB: [birth year] Provider: Ventura Sellers, MD  Identifying Statement:  Aaron Espinoza is a 44 y.o. male with right thalamic anaplastic astrocytoma   Oncologic History: Oncology History  Anaplastic astrocytoma of thalamus ([place])  03/17/2019 Surgery   Stereotactic biopsy by Dr. Marcello Moores; path demonstrates anaplastic astrocytoma   04/12/2019 - 05/21/2019 Radiation Therapy   IMRT with concurrent Temodar 26m/m2 daily   06/14/2019 -  Chemotherapy   The patient had dexamethasone (DECADRON) 4 MG tablet, 1 of 1 cycle, Start date: --, End date: -- temozolomide (TEMODAR) 5 MG capsule, 200 mg/m2/day = 410 mg, Oral, Daily, 1 of 1 cycle, Start date: --, End date: -- temozolomide (TEMODAR) 20 MG capsule, 20 mg, Oral, Daily, 1 of 1 cycle, Start date: 08/24/2019, End date: 08/29/2019 Dose modification: 20 mg (original dose 20 mg, Cycle 1) temozolomide (TEMODAR) 100 MG capsule, 100 mg, Oral, Daily, 1 of 1 cycle, Start date: 08/24/2019, End date: 08/29/2019 Dose modification: 100 mg (original dose 100 mg, Cycle 1) temozolomide (TEMODAR) 140 MG capsule, 280 mg (100 % of original dose 280 mg), Oral, Daily, 1 of 1 cycle, Start date: 08/24/2019, End date: 08/29/2019 Dose modification: 280 mg (original dose 280 mg, Cycle 1)  for chemotherapy treatment.      Biomarkers:  MGMT Unknown.  IDH 1/2 Wild type.  EGFR Unknown  TERT Unknown   Interval History:  Aaron KINNARDpresents to clinic for follow up now having completed cycle #3 of 5-day TMZ.  No serious progression of feeling imbalance with his walking. Fatigue is stable from prior. No other issues with chemotherapy.  Left visual impairment is static. Maintains full functional independence otherwise.  Decadron is currently at 445m daily.  H+P (03/30/19) Patient presented to medical attention in late January, when he noticed mild confusion, gait imbalance.  These symptoms worsened over the next ~10 days, and were accompanied by incontinence.  Brain MRI was performed and demonstrated a right thalamic mass consistent with likely primary brain tumor.  He underwent stereotactic biopsy and ventriculostomy on 03/17/19 with Dr. ThMarcello Moores Following surgery he describes considerable improvement in most of his symptoms; at this time he is near baseline function but "very fatigued" overall.  No seizures or headaches since surgery.  Medications: Current Outpatient Medications on File Prior to Visit  Medication Sig Dispense Refill  . acetaminophen (TYLENOL) 500 MG tablet Take 1,000 mg by mouth every 6 (six) hours as needed.    . Marland Kitchenexamethasone (DECADRON) 2 MG tablet Take 2 tablets (4 mg total) by mouth daily. 60 tablet 1  . docusate sodium (COLACE) 100 MG capsule Take 100 mg by mouth daily.    . Marland Kitchenbuprofen (ADVIL) 200 MG tablet Take 800 mg by mouth every 6 (six) hours as needed.    . polyethylene glycol (MIRALAX / GLYCOLAX) 17 g packet Take 1 packet by mouth daily.    . ondansetron (ZOFRAN) 8 MG tablet Take 1 tablet (8 mg total) by mouth 2 (two) times daily as needed (nausea and vomiting). May take 30-60 minutes prior to Temodar administration if nausea/vomiting occurs. (Patient not taking: Reported on 09/20/2019) 30 tablet 1  . temozolomide (TEMODAR) 140 MG capsule TAKE 2 CAPSULES BY MOUTH DAILY FOR 5 DAYS. TAKE ON DAYS 1-5 OF CYCLE 1 OF TREATMENT. MAY TAKE ON  AN EMPTY STOMACH TO DECREASE NAUSEA & VING. (Patient not taking: Reported on 09/20/2019) 10 capsule 0  . temozolomide (TEMODAR) 20 MG capsule TAKE 1 CAPSULE BY MOUTH DAILY FOR 5 DAYS. TAKE ON DAYS 1-5 OF CYCLE 1 OF TREATMENT. MAY TAKE ON AN EMPTY STOMACH TO DECREASE NAUSEA & VONG. (Patient not taking: Reported on 09/20/2019) 5 capsule 0   No current facility-administered medications on file  prior to visit.    Allergies:  Allergies  Allergen Reactions  . Gadolinium Derivatives Itching   Past Medical History:  Past Medical History:  Diagnosis Date  . BPH (benign prostatic hyperplasia) 07/20/2015  . Brain mass   . Cognitive dysfunction 01/2019  . Fatigue 01/2019  . Headache   . NF (neurofibromatosis) ([place]) 07/20/2015   NF1   Past Surgical History:  Past Surgical History:  Procedure Laterality Date  . HAND SURGERY Left    pinky - tumor benign  . LEG SURGERY Left    inner thigh tumor - benign  . VENTRICULOSTOMY N/A 03/17/2019   Procedure: ENDOSCOPIC THIRD VENTRICULOSTOMY;  Surgeon: Vallarie Mare, MD;  Location: [facility];  Service: Neurosurgery;  Laterality: N/A;  ENDOSCOPIC THIRD VENTRICULOSTOMY   Social History:  Social History   Socioeconomic History  . Marital status: Married    Spouse name: Not on file  . Number of children: Not on file  . Years of education: Not on file  . Highest education level: Not on file  Occupational History  . Not on file  Tobacco Use  . Smoking status: Former Smoker    Types: Cigars, Cigarettes  . Smokeless tobacco: Never Used  . Tobacco comment: Quit smoking cigarettes 25 yrs ago, quit cigars in 2020  Vaping Use  . Vaping Use: Never used  Substance and Sexual Activity  . Alcohol use: Yes    Alcohol/week: 6.0 - 9.0 standard drinks    Types: 6 - 9 Standard drinks or equivalent per week  . Drug use: No  . Sexual activity: Not on file  Other Topics Concern  . Not on file  Social History Narrative  . Not on file   Social Determinants of Health   Financial Resource Strain:   . Difficulty of Paying Living Expenses:   Food Insecurity:   . Worried About Charity fundraiser in the Last Year:   . Arboriculturist in the Last Year:   Transportation Needs:   . Film/video editor (Medical):   Marland Kitchen Lack of Transportation (Non-Medical):   Physical Activity:   . Days of Exercise per Week:   . Minutes of Exercise per Session:    Stress:   . Feeling of Stress :   Social Connections:   . Frequency of Communication with Friends and Family:   . Frequency of Social Gatherings with Friends and Family:   . Attends Religious Services:   . Active Member of Clubs or Organizations:   . Attends Archivist Meetings:   Marland Kitchen Marital Status:   Intimate Partner Violence:   . Fear of Current or Ex-Partner:   . Emotionally Abused:   Marland Kitchen Physically Abused:   . Sexually Abused:    Family History:  Family History  Problem Relation Age of Onset  . Cancer Paternal Grandfather        Prostate    Review of Systems: Constitutional: Doesn't report fevers, chills or abnormal weight loss Eyes: Doesn't report blurriness of vision Ears, nose, mouth, throat, and face: Doesn't report sore throat  Respiratory: Doesn't report cough, dyspnea or wheezes Cardiovascular: Doesn't report palpitation, chest discomfort  Gastrointestinal:  Doesn't report nausea, constipation, diarrhea GU: Doesn't report incontinence Skin: Doesn't report skin rashes Neurological: Per HPI Musculoskeletal: Doesn't report joint pain Behavioral/Psych: Doesn't report anxiety  Physical Exam: Vitals:   09/20/19 1203  BP: (!) 147/91  Pulse: (!) 48  Resp: 18  Temp: (!) 97.5 F (36.4 C)  SpO2: 100%   KPS: 90. General: ++fatigue Head: Normal EENT: No conjunctival injection or scleral icterus.  Lungs: Resp effort normal Cardiac: Regular rate Abdomen: Non-distended abdomen Skin: No rashes cyanosis or petechiae. Extremities: No clubbing or edema  Neurologic Exam: Mental Status: Awake, alert, attentive to examiner. Oriented to self and environment. Language is fluent with intact comprehension.  Cranial Nerves: Visual acuity is grossly normal. L visual field impairment. Extra-ocular movements intact. No ptosis. Face is symmetric Motor: Tone and bulk are normal. Power is full in both arms and legs. Reflexes are symmetric, no pathologic reflexes present.   Sensory: Intact to light touch Gait: Normal.   Labs: I have reviewed the data as listed    Component Value Date/Time   NA 137 09/20/2019 1140   K 4.0 09/20/2019 1140   CL 99 09/20/2019 1140   CO2 27 09/20/2019 1140   GLUCOSE 88 09/20/2019 1140   BUN 12 09/20/2019 1140   CREATININE 0.88 09/20/2019 1140   CREATININE 0.83 03/04/2019 1206   CALCIUM 10.1 09/20/2019 1140   PROT 7.1 09/20/2019 1140   ALBUMIN 3.9 09/20/2019 1140   AST 11 (L) 09/20/2019 1140   ALT 16 09/20/2019 1140   ALKPHOS 48 09/20/2019 1140   BILITOT 1.0 09/20/2019 1140   GFRNONAA >60 09/20/2019 1140   GFRNONAA 106 03/04/2019 1206   GFRAA >60 09/20/2019 1140   GFRAA 123 03/04/2019 1206   Lab Results  Component Value Date   WBC 7.0 09/20/2019   NEUTROABS 4.2 09/20/2019   HGB 16.1 09/20/2019   HCT 46.1 09/20/2019   MCV 89.7 09/20/2019   PLT 223 09/20/2019   Imaging:  [place] Clinician Interpretation: I have personally reviewed the CNS images as listed.  My interpretation, in the context of the patient's clinical presentation, is treatment effect vs true progression  MR BRAIN W WO CONTRAST  Result Date: 09/15/2019 CLINICAL DATA:  Brain/CNS neoplasm. EXAM: MRI HEAD WITHOUT AND WITH CONTRAST TECHNIQUE: Multiplanar, multiecho pulse sequences of the brain and surrounding structures were obtained without and with intravenous contrast. CONTRAST:  47m GADAVIST GADOBUTROL 1 MMOL/ML IV SOLN COMPARISON:  08/20/2019 MRI head. FINDINGS: Brain: Right lateral midbrain and thalamic mass measuring 3.3 cm (9:29, previously 3.3 cm) is unchanged in size. Associated heterogenous enhancement is slightly more conspicuous within the thalamus when compared on coronal post con images (16:15). Perilesional T2/FLAIR hyperintense signal demonstrates increased involvement of the right corona radiata. Slight 1-2 mm leftward midline shift at the level of the thalamus with narrowing of the proximal cerebral aqueduct and third ventricle is  unchanged. Stable caliber of the ventricular system. No extra-axial fluid collection. Prior right frontal ventricular drainage catheter tract. Vascular: Normal flow voids. Skull and upper cervical spine: Normal marrow signal. Sinuses/Orbits: Normal orbits. Clear paranasal sinuses. No mastoid effusion. Other: None. IMPRESSION: Stable size of right thalamic/midbrain mass measuring 3.3 cm. Mildly increased heterogenous enhancement within the thalamus. Perilesional T2/FLAIR hyperintense signal demonstrates mildly increased involvement of the corona radiata. Electronically Signed   By: CPrimitivo GauzeM.D.   On: 09/15/2019 09:54   Assessment/Plan Anaplastic ast[place] of [place] ([place] [  C71.0]  Mr. Deshmukh is clinically stable, now having completed cycle #3 5-day Temozolomide at 254m/m2 dose level.  MRI demonstrates subtle progression of disease which again represents either radio-inflammatory process or organic tumor progression.  Growth rate appears to have slowed from prior study, also suggestive of radio-inflammatory process.  We also discussed the possbility of organic tumor progression, but at this time would still favor pseudo-progression.  We recommended continuing treatment with cycle #4 Temozolomide increased to 200 mg/m2, on for five days and off for twenty three days in twenty eight day cycles. The patient will have a complete blood count performed on days 21 and 28 of each cycle, and a comprehensive metabolic panel performed on day 28 of each cycle. Labs may need to be performed more often. Zofran will prescribed for home use for nausea/vomiting.   Chemotherapy should be held for the following:  ANC less than 1,000  Platelets less than 100,000  LFT or creatinine greater than 2x ULN  If clinical concerns/contraindications develop  Given radiographic findings today, we ask that JGORO WENRICKreturn to clinic in 1 month with labs for evaluation prior to cycle #5.  He should begin  weaning decadron by decreasing to 321mdaily once completed week of chemo.  We appreciate the opportunity to participate in the care of JaMOROCCO GIPE  All questions were answered. The patient knows to call the clinic with any problems, questions or concerns. No barriers to learning were detected.  The total time spent in the encounter was 40 minutes and more than 50% was on counseling and review of test results   ZaVentura SellersMD Medical Director of Neuro-Oncology [facility] We[facility]8/16/21 4:01 PM

## 2019-09-21 ENCOUNTER — Telehealth: Payer: Self-pay | Admitting: Internal Medicine

## 2019-09-21 NOTE — Telephone Encounter (Signed)
Scheduled per 8/16 los. Pt is aware of appt time and date.

## 2019-09-30 ENCOUNTER — Other Ambulatory Visit: Payer: Self-pay | Admitting: Internal Medicine

## 2019-09-30 DIAGNOSIS — C710 Malignant neoplasm of cerebrum, except lobes and ventricles: Secondary | ICD-10-CM

## 2019-09-30 NOTE — Telephone Encounter (Signed)
Refill request

## 2019-10-25 ENCOUNTER — Inpatient Hospital Stay: Payer: BC Managed Care – PPO | Attending: Internal Medicine | Admitting: Internal Medicine

## 2019-10-25 ENCOUNTER — Inpatient Hospital Stay: Payer: BC Managed Care – PPO

## 2019-10-25 ENCOUNTER — Other Ambulatory Visit: Payer: Self-pay

## 2019-10-25 VITALS — BP 159/85 | HR 58 | Temp 97.2°F | Resp 18 | Ht 72.0 in | Wt 198.6 lb

## 2019-10-25 DIAGNOSIS — Z791 Long term (current) use of non-steroidal anti-inflammatories (NSAID): Secondary | ICD-10-CM | POA: Insufficient documentation

## 2019-10-25 DIAGNOSIS — Z923 Personal history of irradiation: Secondary | ICD-10-CM | POA: Diagnosis not present

## 2019-10-25 DIAGNOSIS — Z9221 Personal history of antineoplastic chemotherapy: Secondary | ICD-10-CM | POA: Diagnosis not present

## 2019-10-25 DIAGNOSIS — N400 Enlarged prostate without lower urinary tract symptoms: Secondary | ICD-10-CM | POA: Insufficient documentation

## 2019-10-25 DIAGNOSIS — R5383 Other fatigue: Secondary | ICD-10-CM | POA: Insufficient documentation

## 2019-10-25 DIAGNOSIS — Z87891 Personal history of nicotine dependence: Secondary | ICD-10-CM | POA: Diagnosis not present

## 2019-10-25 DIAGNOSIS — Z79899 Other long term (current) drug therapy: Secondary | ICD-10-CM | POA: Diagnosis not present

## 2019-10-25 DIAGNOSIS — C710 Malignant neoplasm of cerebrum, except lobes and ventricles: Secondary | ICD-10-CM | POA: Insufficient documentation

## 2019-10-25 DIAGNOSIS — Z7952 Long term (current) use of systemic steroids: Secondary | ICD-10-CM | POA: Insufficient documentation

## 2019-10-25 LAB — CBC WITH DIFFERENTIAL (CANCER CENTER ONLY)
Abs Immature Granulocytes: 0.29 10*3/uL — ABNORMAL HIGH (ref 0.00–0.07)
Basophils Absolute: 0.1 10*3/uL (ref 0.0–0.1)
Basophils Relative: 0 %
Eosinophils Absolute: 0.1 10*3/uL (ref 0.0–0.5)
Eosinophils Relative: 0 %
HCT: 44.7 % (ref 39.0–52.0)
Hemoglobin: 15.7 g/dL (ref 13.0–17.0)
Immature Granulocytes: 3 %
Lymphocytes Relative: 10 %
Lymphs Abs: 1.2 10*3/uL (ref 0.7–4.0)
MCH: 32 pg (ref 26.0–34.0)
MCHC: 35.1 g/dL (ref 30.0–36.0)
MCV: 91 fL (ref 80.0–100.0)
Monocytes Absolute: 0.9 10*3/uL (ref 0.1–1.0)
Monocytes Relative: 8 %
Neutro Abs: 8.7 10*3/uL — ABNORMAL HIGH (ref 1.7–7.7)
Neutrophils Relative %: 79 %
Platelet Count: 227 10*3/uL (ref 150–400)
RBC: 4.91 MIL/uL (ref 4.22–5.81)
RDW: 12.5 % (ref 11.5–15.5)
WBC Count: 11.2 10*3/uL — ABNORMAL HIGH (ref 4.0–10.5)
nRBC: 0 % (ref 0.0–0.2)

## 2019-10-25 LAB — CMP (CANCER CENTER ONLY)
ALT: 23 U/L (ref 0–44)
AST: 13 U/L — ABNORMAL LOW (ref 15–41)
Albumin: 3.9 g/dL (ref 3.5–5.0)
Alkaline Phosphatase: 56 U/L (ref 38–126)
Anion gap: 6 (ref 5–15)
BUN: 17 mg/dL (ref 6–20)
CO2: 32 mmol/L (ref 22–32)
Calcium: 9.7 mg/dL (ref 8.9–10.3)
Chloride: 100 mmol/L (ref 98–111)
Creatinine: 0.95 mg/dL (ref 0.61–1.24)
GFR, Est AFR Am: >60 mL/min (ref >60)
GFR, Estimated: >60 mL/min (ref >60)
Glucose, Bld: 74 mg/dL (ref 70–99)
Potassium: 4.1 mmol/L (ref 3.5–5.1)
Sodium: 138 mmol/L (ref 135–145)
Total Bilirubin: 0.9 mg/dL (ref 0.3–1.2)
Total Protein: 7 g/dL (ref 6.5–8.1)

## 2019-10-25 MED ORDER — TEMOZOLOMIDE 100 MG PO CAPS: 400.0000 mg | ORAL_CAPSULE | Freq: Every day | ORAL | 0 refills | Status: AC

## 2019-10-25 NOTE — Progress Notes (Signed)
[facility] at [place], [place] [address] [phone]   Interval Evaluation  Date of Service: 10/25/19 Patient Name: Aaron Espinoza Patient MRN: [id number] Patient DOB: January 26, 1976 Provider: Ventura Sellers, MD  Identifying Statement:  Aaron Espinoza is a 44 y.o. male with right thalamic anaplastic astrocytoma   Oncologic History: Oncology History  Anaplastic astrocytoma of thalamus ([place])  03/17/2019 Surgery   Stereotactic biopsy by Dr. Marcello Moores; path demonstrates anaplastic astrocytoma   04/12/2019 - 05/21/2019 Radiation Therapy   IMRT with concurrent Temodar 53m/m2 daily   06/14/2019 -  Chemotherapy   The patient had dexamethasone (DECADRON) 4 MG tablet, 1 of 1 cycle, Start date: --, End date: -- temozolomide (TEMODAR) 5 MG capsule, 200 mg/m2/day = 410 mg, Oral, Daily, 1 of 1 cycle, Start date: --, End date: -- temozolomide (TEMODAR) 20 MG capsule, 20 mg, , , 1 of 1 cycle, Start date: 08/30/2019, End date: -- Dose modification: 20 mg (original dose 20 mg, Cycle 1) temozolomide (TEMODAR) 100 MG capsule, 400 mg (400 % of original dose 100 mg), Oral, Daily, 1 of 1 cycle, Start date: 09/20/2019, End date: 09/25/2019 Dose modification: 100 mg (original dose 100 mg, Cycle 1), 400 mg (original dose 100 mg, Cycle 1) temozolomide (TEMODAR) 140 MG capsule, 280 mg (100 % of original dose 280 mg), , , 1 of 1 cycle, Start date: 08/30/2019, End date: -- Dose modification: 280 mg (original dose 280 mg, Cycle 1)  for chemotherapy treatment.      Biomarkers:  MGMT Unknown.  IDH 1/2 Wild type.  EGFR Unknown  TERT Unknown   Interval History:  Aaron Espinoza to clinic for follow up now having completed cycle #4 of 5-day TMZ.  He describes mild increase in imbalance with his walking. Fatigue is stable from prior. No other issues with chemotherapy.  Left visual impairment is static. Maintains full functional independence otherwise.   [place] Decadron is currently at 460mdaily.  H+P (03/30/19) Patient presented to medical attention in late January, when he noticed mild confusion, gait imbalance.  These symptoms worsened over the next ~10 days, and were accompanied by incontinence.  Brain MRI was performed and demonstrated a right thalamic mass consistent with likely primary brain tumor.  He underwent stereotactic biopsy and ventriculostomy on 03/17/19 with Dr. ThMarcello Moores Following surgery he describes considerable improvement in most of his symptoms; at this time he is near baseline function but "very fatigued" overall.  No seizures or headaches since surgery.  Medications: Current Outpatient Medications on File Prior to Visit  Medication Sig Dispense Refill  . acetaminophen (TYLENOL) 500 MG tablet Take 1,000 mg by mouth every 6 (six) hours as needed.    . Marland Kitchenexamethasone (DECADRON) 2 MG tablet Take 2 tablets (4 mg total) by mouth daily. 60 tablet 1  . docusate sodium (COLACE) 100 MG capsule Take 100 mg by mouth daily.    . Marland Kitchenbuprofen (ADVIL) 200 MG tablet Take 800 mg by mouth every 6 (six) hours as needed.    . ondansetron (ZOFRAN) 8 MG tablet Take 1 tablet (8 mg total) by mouth 2 (two) times daily as needed (nausea and vomiting). May take 30-60 minutes prior to Temodar administration if nausea/vomiting occurs. (Patient not taking: Reported on 09/20/2019) 30 tablet 1  . pantoprazole (PROTONIX) 40 MG tablet Take 1 tablet (40 mg total) by mouth daily. 30 tablet 3  . polyethylene glycol (  MIRALAX / GLYCOLAX) 17 g packet Take 1 packet by mouth daily.    Marland Kitchen temozolomide (TEMODAR) 140 MG capsule TAKE 2 CAPSULES BY MOUTH DAILY FOR 5 DAYS. TAKE ON DAYS 1-5 OF CYCLE 1 OF TREATMENT. MAY TAKE ON AN EMPTY STOMACH TO DECREASE NAUSEA & VING. (Patient not taking: Reported on 09/20/2019) 10 capsule 0  . temozolomide (TEMODAR) 20 MG capsule TAKE 1 CAPSULE BY MOUTH DAILY FOR 5 DAYS. TAKE ON DAYS 1-5 OF CYCLE 1 OF  TREATMENT. MAY TAKE ON AN EMPTY STOMACH TO DECREASE NAUSEA & VONG. (Patient not taking: Reported on 09/20/2019) 5 capsule 0   No current facility-administered medications on file prior to visit.    Allergies:  Allergies  Allergen Reactions  . Gadolinium Derivatives Itching   Past Medical History:  Past Medical History:  Diagnosis Date  . BPH (benign prostatic hyperplasia) 07/20/2015  . Brain mass   . Cognitive dysfunction 01/2019  . Fatigue 01/2019  . Headache   . NF (neurofibromatosis) ([facility]) 07/20/2015   NF1   Past Surgical History:  Past Surgical History:  Procedure Laterality Date  . HAND SURGERY Left    pinky - tumor benign  . LEG SURGERY Left    inner thigh tumor - benign  . VENTRICULOSTOMY N/A 03/17/2019   Procedure: ENDOSCOPIC THIRD VENTRICULOSTOMY;  Surgeon: Vallarie Mare, MD;  Location: [facility];  Service: Neurosurgery;  Laterality: N/A;  ENDOSCOPIC THIRD VENTRICULOSTOMY   Social History:  Social History   Socioeconomic History  . Marital status: Married    Spouse name: Not on file  . Number of children: Not on file  . Years of education: Not on file  . Highest education level: Not on file  Occupational History  . Not on file  Tobacco Use  . Smoking status: Former Smoker    Types: Cigars, Cigarettes  . Smokeless tobacco: Never Used  . Tobacco comment: Quit smoking cigarettes 25 yrs ago, quit cigars in 2020  Vaping Use  . Vaping Use: Never used  Substance and Sexual Activity  . Alcohol use: Yes    Alcohol/week: 6.0 - 9.0 standard drinks    Types: 6 - 9 Standard drinks or equivalent per week  . Drug use: No  . Sexual activity: Not on file  Other Topics Concern  . Not on file  Social History Narrative  . Not on file   Social Determinants of Health   Financial Resource Strain:   . Difficulty of Paying Living Expenses: Not on file  Food Insecurity:   . Worried About Charity fundraiser in the Last Year: Not on file  . Ran Out of Food in the Last  Year: Not on file  Transportation Needs:   . Lack of Transportation (Medical): Not on file  . Lack of Transportation (Non-Medical): Not on file  Physical Activity:   . Days of Exercise per Week: Not on file  . Minutes of Exercise per Session: Not on file  Stress:   . Feeling of Stress : Not on file  Social Connections:   . Frequency of Communication with Friends and Family: Not on file  . Frequency of Social Gatherings with Friends and Family: Not on file  . Attends Religious Services: Not on file  . Active Member of Clubs or Organizations: Not on file  . Attends Archivist Meetings: Not on file  . Marital Status: Not on file  Intimate Partner Violence:   . Fear of Current or Ex-Partner: Not on file  .  Emotionally Abused: Not on file  . Physically Abused: Not on file  . Sexually Abused: Not on file   Family History:  Family History  Problem Relation Age of Onset  . Cancer Paternal Grandfather        Prostate    Review of Systems: Constitutional: Doesn't report fevers, chills or abnormal weight loss Eyes: Doesn't report blurriness of vision Ears, nose, mouth, throat, and face: Doesn't report sore throat Respiratory: Doesn't report cough, dyspnea or wheezes Cardiovascular: Doesn't report palpitation, chest discomfort  Gastrointestinal:  Doesn't report nausea, constipation, diarrhea GU: Doesn't report incontinence Skin: Doesn't report skin rashes Neurological: Per HPI Musculoskeletal: Doesn't report joint pain Behavioral/Psych: Doesn't report anxiety  Physical Exam: Vitals:   10/25/19 1220  BP: (!) 159/85  Pulse: (!) 58  Resp: 18  Temp: (!) 97.2 F (36.2 C)  SpO2: 100%   KPS: 90. General: ++fatigue Head: Normal EENT: No conjunctival injection or scleral icterus.  Lungs: Resp effort normal Cardiac: Regular rate Abdomen: Non-distended abdomen Skin: No rashes cyanosis or petechiae. Extremities: No clubbing or edema  Neurologic Exam: Mental Status:  Awake, alert, attentive to examiner. Oriented to self and environment. Language is fluent with intact comprehension.  Cranial Nerves: Visual acuity is grossly normal. L visual field impairment. Extra-ocular movements intact. No ptosis. Face is symmetric Motor: Tone and bulk are normal. Power is full in both arms and legs. Reflexes are symmetric, no pathologic reflexes present.  Sensory: Intact to light touch Gait: Normal.   Labs: I have reviewed the data as listed    Component Value Date/Time   NA 137 09/20/2019 1140   K 4.0 09/20/2019 1140   CL 99 09/20/2019 1140   CO2 27 09/20/2019 1140   GLUCOSE 88 09/20/2019 1140   BUN 12 09/20/2019 1140   CREATININE 0.88 09/20/2019 1140   CREATININE 0.83 03/04/2019 1206   CALCIUM 10.1 09/20/2019 1140   PROT 7.1 09/20/2019 1140   ALBUMIN 3.9 09/20/2019 1140   AST 11 (L) 09/20/2019 1140   ALT 16 09/20/2019 1140   ALKPHOS 48 09/20/2019 1140   BILITOT 1.0 09/20/2019 1140   GFRNONAA >60 09/20/2019 1140   GFRNONAA 106 03/04/2019 1206   GFRAA >60 09/20/2019 1140   GFRAA 123 03/04/2019 1206   Lab Results  Component Value Date   WBC 11.2 (H) 10/25/2019   NEUTROABS 8.7 (H) 10/25/2019   HGB 15.7 10/25/2019   HCT 44.7 10/25/2019   MCV 91.0 10/25/2019   PLT 227 10/25/2019    Assessment/Plan Anaplastic astrocytoma of thalamus ([facility]) [C71.0]  Aaron Espinoza is clinically stable, now having completed cycle #4 5-day Temozolomide at 252m/m2 dose level.    We recommended continuing treatment with cycle #5 Temozolomide increased to 200 mg/m2, on for five days and off for twenty three days in twenty eight day cycles. The patient will have a complete blood count performed on days 21 and 28 of each cycle, and a comprehensive metabolic panel performed on day 28 of each cycle. Labs may need to be performed more often. Zofran will prescribed for home use for nausea/vomiting.   Chemotherapy should be held for the following:  ANC less than 1,000  Platelets  less than 100,000  LFT or creatinine greater than 2x ULN  If clinical concerns/contraindications develop  We ask that Aaron SIDORreturn to clinic in 1 months following next brain MRI, or sooner as needed prior to cycle #6.  Recommended decreasing decadron to 3108mdaily.  We appreciate the opportunity to participate  in the care of Aaron Espinoza.   All questions were answered. The patient knows to call the clinic with any problems, questions or concerns. No barriers to learning were detected.  I have spent a total of 30 minutes of face-to-face and non-face-to-face time, excluding clinical staff time, preparing to see patient, ordering tests and/or medications, counseling the patient, and independently interpreting results and communicating results to the patient/family/caregiver   Ventura Sellers, MD Medical Director of Neuro-Oncology [facility] 10/25/19 12:17 PM

## 2019-10-26 ENCOUNTER — Telehealth: Payer: Self-pay | Admitting: Internal Medicine

## 2019-10-26 ENCOUNTER — Encounter: Payer: Self-pay | Admitting: *Deleted

## 2019-10-26 NOTE — Telephone Encounter (Signed)
Scheduled per 9/20 los. Pt is aware of appt time and date.

## 2019-10-27 ENCOUNTER — Other Ambulatory Visit: Payer: Self-pay | Admitting: Internal Medicine

## 2019-10-27 DIAGNOSIS — C710 Malignant neoplasm of cerebrum, except lobes and ventricles: Secondary | ICD-10-CM

## 2019-10-27 NOTE — Telephone Encounter (Signed)
Refill request

## 2019-10-29 DIAGNOSIS — Z1211 Encounter for screening for malignant neoplasm of colon: Secondary | ICD-10-CM

## 2019-11-10 ENCOUNTER — Other Ambulatory Visit: Payer: Self-pay | Admitting: Radiation Therapy

## 2019-11-12 ENCOUNTER — Other Ambulatory Visit: Payer: Self-pay | Admitting: Internal Medicine

## 2019-11-15 ENCOUNTER — Other Ambulatory Visit: Payer: Self-pay

## 2019-11-15 ENCOUNTER — Ambulatory Visit (HOSPITAL_COMMUNITY)
Admission: RE | Admit: 2019-11-15 | Discharge: 2019-11-15 | Disposition: A | Discharge status: Home / Self Care | Payer: BC Managed Care – PPO | Source: Ambulatory Visit | Attending: Internal Medicine | Admitting: Internal Medicine

## 2019-11-15 DIAGNOSIS — I619 Nontraumatic intracerebral hemorrhage, unspecified: Secondary | ICD-10-CM | POA: Diagnosis not present

## 2019-11-15 DIAGNOSIS — R220 Localized swelling, mass and lump, head: Secondary | ICD-10-CM | POA: Diagnosis not present

## 2019-11-15 DIAGNOSIS — C710 Malignant neoplasm of cerebrum, except lobes and ventricles: Secondary | ICD-10-CM | POA: Insufficient documentation

## 2019-11-15 DIAGNOSIS — C719 Malignant neoplasm of brain, unspecified: Secondary | ICD-10-CM | POA: Diagnosis not present

## 2019-11-15 DIAGNOSIS — G9389 Other specified disorders of brain: Secondary | ICD-10-CM | POA: Diagnosis not present

## 2019-11-15 IMAGING — MR MR HEAD WO/W CM
16 series · 48 of 48 positions shown · IV contrast (gadavist)
Comparison: Brain MRI [DATE] and earlier.

CLINICAL DATA: 44-year-old male with right thalamic anaplastic
astrocytoma. Status posterior tactic biopsy in [REDACTED], radiation
treatment completed in [place], chemotherapy. Restaging.

EXAM:
MRI HEAD WITHOUT AND WITH CONTRAST
TECHNIQUE: Multiplanar, multiecho pulse sequences of the brain and surrounding
structures were obtained without and with intravenous contrast.
CONTRAST:  9mL GADAVIST GADOBUTROL 1 MMOL/ML IV SOLN

[Series 5: DWI · axial · 3.0mm · 1.36mm/px · z∈[-17,+140]mm · 4 of 111 slices shown (1 of 4)]
[im 1/111]
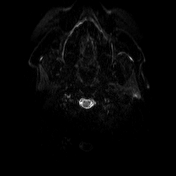
[im 37/111]
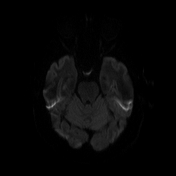
[im 74/111]
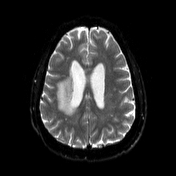
[im 111/111]
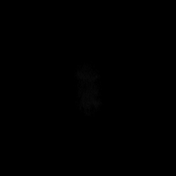

[Series 6: DWI · axial · 3.0mm · 1.36mm/px · z∈[-17,+140]mm · 2 of 56 slices shown (2 of 4)]
[im 1/56]
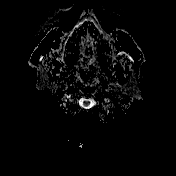
[im 56/56]
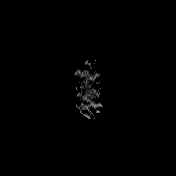

[Series 7: mip_images(sw) · axial · 24.0mm · 0.72mm/px · z∈[-9,+129]mm · 2 of 49 slices shown]
[im 1/49]
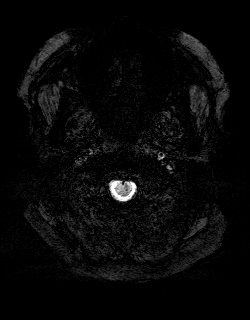
[im 49/49]
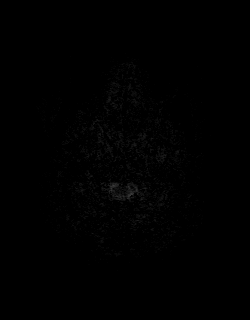

[Series 8: swi_images · axial · 3.0mm · 0.72mm/px · z∈[-19,+139]mm · 2 of 56 slices shown]
[im 1/56]
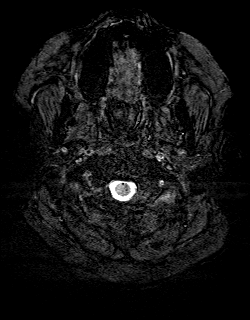
[im 56/56]
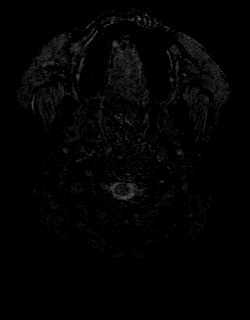

[Series 9: FLAIR · axial · 3.0mm · 0.72mm/px · z∈[-20,+140]mm · 2 of 57 slices shown]
[im 1/57]
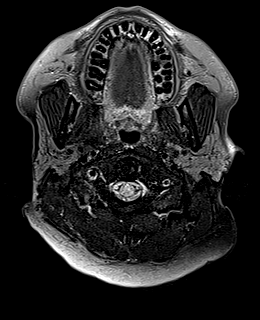
[im 57/57]
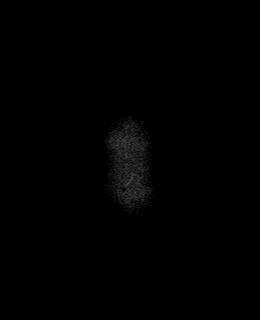

[Series 10: T1 · sagittal · 5.0mm · 0.75mm/px · 1 of 25 slices shown (1 of 2)]
[im 1/25]
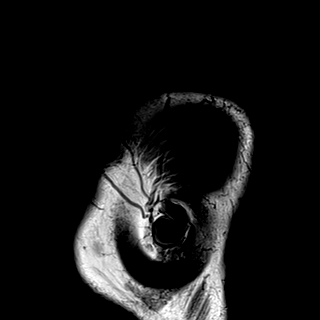

[Series 11: T2 · axial · 5.0mm · 0.60mm/px · 1 of 27 slices shown]
[im 1/27]
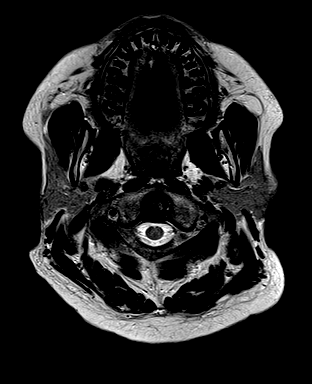

[Series 12: T1 · axial · 1.0mm · 0.90mm/px · z∈[-24,+143]mm · 7 of 176 slices shown (2 of 2)]
[im 1/176]
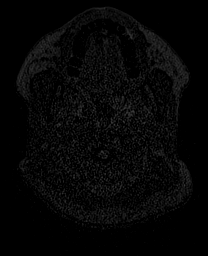
[im 30/176]
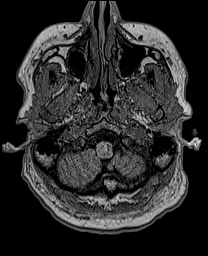
[im 59/176]
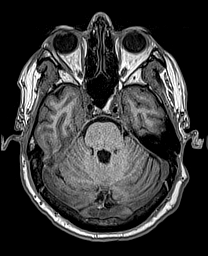
[im 88/176]
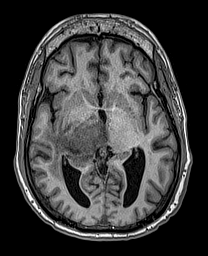
[im 117/176]
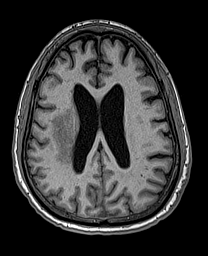
[im 146/176]
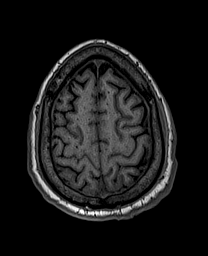
[im 176/176]
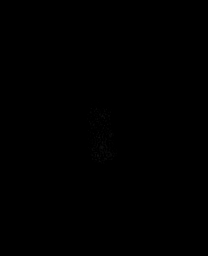

[Series 13: DWI · coronal · 5.0mm · 1.31mm/px · 3 of 76 slices shown (3 of 4)]
[im 1/76]
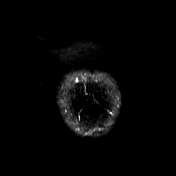
[im 38/76]
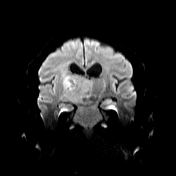
[im 76/76]
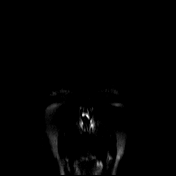

[Series 14: DWI · coronal · 5.0mm · 1.31mm/px · 1 of 38 slices shown (4 of 4)]
[im 1/38]
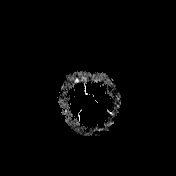

[Series 15: T2 post-contrast · coronal · 5.0mm · 0.57mm/px · 1 of 30 slices shown]
[im 1/30]
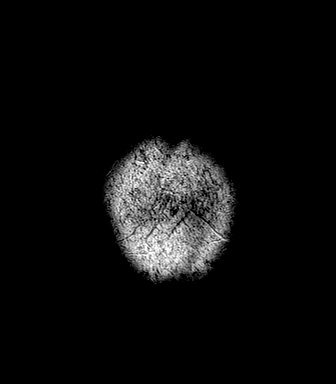

[Series 16: T1 post-contrast · axial · 1.0mm · 0.90mm/px · z∈[-24,+143]mm · 7 of 176 slices shown (1 of 3)]
[im 1/176]
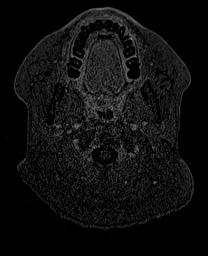
[im 30/176]
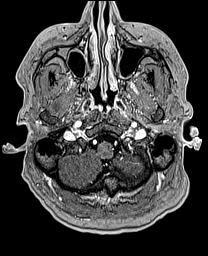
[im 59/176]
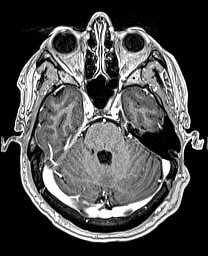
[im 88/176]
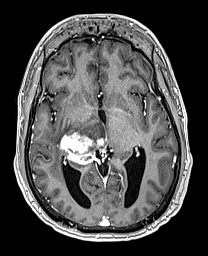
[im 117/176]
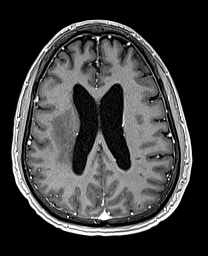
[im 146/176]
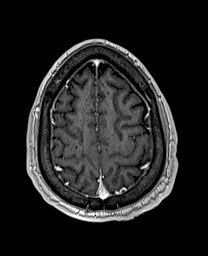
[im 176/176]
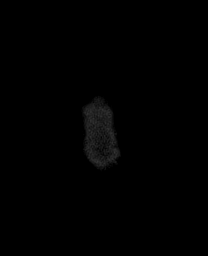

[Series 17: T1 post-contrast · coronal · 5.0mm · 0.43mm/px · 1 of 30 slices shown (2 of 3)]
[im 1/30]
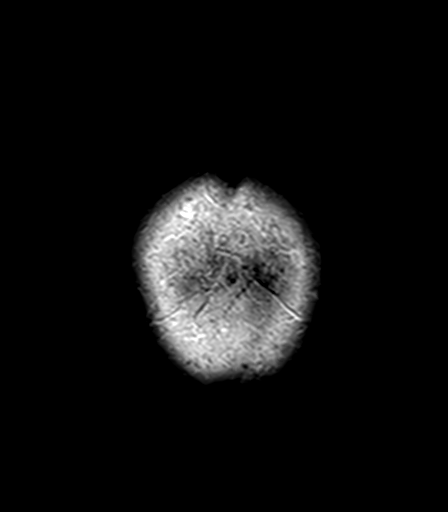

[Series 18: T1 post-contrast · sagittal · 5.0mm · 0.75mm/px · 1 of 25 slices shown (3 of 3)]
[im 1/25]
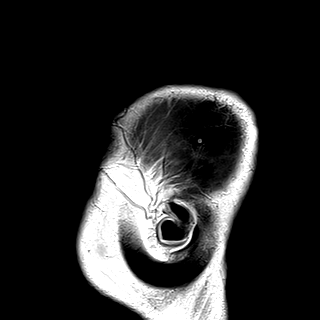

[Series 100: <mpr range> · axial · 1.0mm · 0.48mm/px · z∈[+59,+169]mm · 7 of 177 slices shown]
[im 1/177]
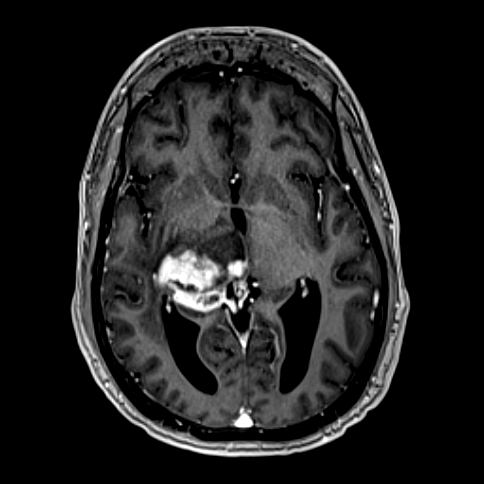
[im 30/177]
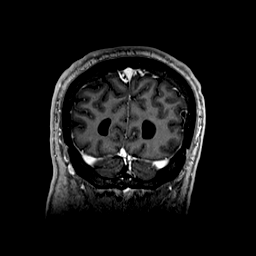
[im 59/177]
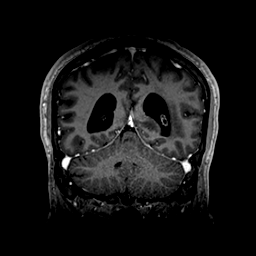
[im 89/177]
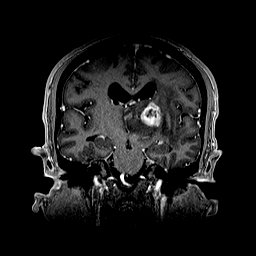
[im 118/177]
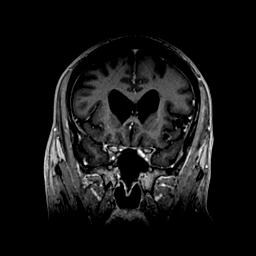
[im 147/177]
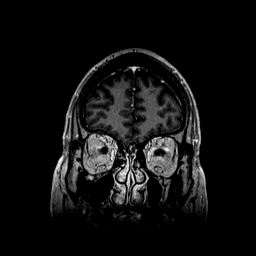
[im 177/177]
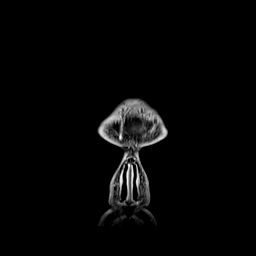

[Series 101: <mpr range(1)> · axial · 1.0mm · 0.48mm/px · z∈[+59,+173]mm · 6 of 169 slices shown]
[im 1/169]
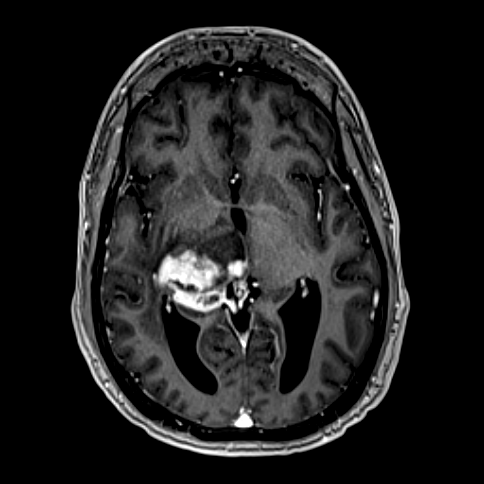
[im 34/169]
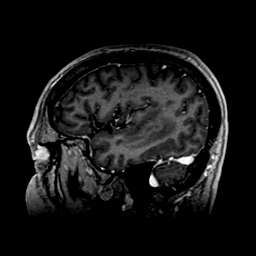
[im 68/169]
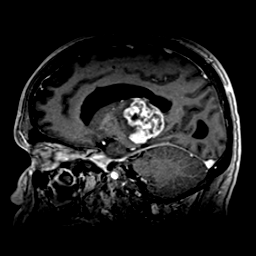
[im 101/169]
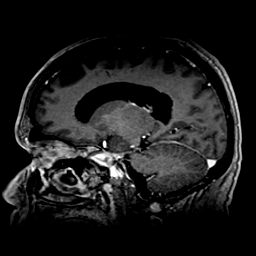
[im 135/169]
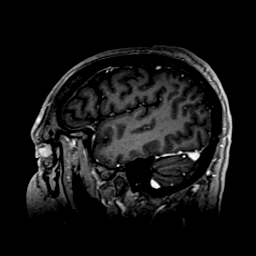
[im 169/169]
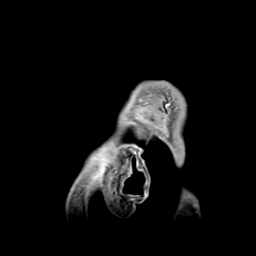

[48 of 48 positions shown; findings below may reference images not displayed]

FINDINGS: Brain: Clear enlargement since [REDACTED] of the T2 and FLAIR hyperintense
lesion centered at the right thalamus and involving the right
midbrain.

Transaxially, the dominant T2 hyperintense component now measures up
to 38 mm (series 11, image 15) versus 33 mm in [REDACTED]. On coronal T2
the confluent signal expanding the thalamus and into the midbrain is
now up to of 46 mm craniocaudal (series 15, image 14 today), versus
39-40 mm in [REDACTED]. DWI abnormality has mildly increased since [REDACTED]
but the majority of the lesion remains facilitated on diffusion.
Evidence of microhemorrhage within the mass has also increased since
[REDACTED].

Similar progression of nodular and irregular postcontrast
enhancement, most notably along the midline and superior margins of
the lesion (series 17, image 14 today versus series 17, image [REDACTED]), now more contiguous with the right lateral ventricle.
Confluent involvement and enhancement within the tail of the right
hippocampus again noted.

Chronic ependymal enhancement of the 3rd ventricle has not
significantly changed. And no remote ependymal or leptomeningeal
enhancement is identified. No ventriculomegaly.

Associated progression of T2 and FLAIR hyperintensity tracking into
the right frontal and temporal lobes. Comparatively mild increased
T2 and FLAIR hyperintensity in the right brainstem.

No superimposed restricted diffusion suggestive of acute infarction.
No significant midline shift. Basilar cisterns remain patent.
Superimposed linear probable post biopsy related hemosiderin
tracking laterally from the lesion is stable. No acute intracranial
hemorrhage. Previous right superior ventriculostomy.
Cervicomedullary junction and pituitary are within normal limits.

Vascular: Major intracranial vascular flow voids are stable.
Dominant right vertebral artery. The major dural venous sinuses are
enhancing and appear to be patent.

Skull and upper cervical spine: Stable, negative.

Sinuses/Orbits: Stable, negative.

Other: Mastoids remain clear. Visible internal auditory structures
appear normal. Stable scalp and face.
IMPRESSION: 1. Increased size and enhancement of the heterogeneous mass centered
at the right thalamus and involving the right midbrain. Increased
surrounding T2 and FLAIR hyperintensity.
The appearance is suspicious for true progression although post
treatment pseudoprogression is possible.

2. No ventriculomegaly or significant intracranial mass effect at
this time. Ependymal enhancement of the 3rd ventricle has not
significantly changed. And no remote ependymal or leptomeningeal
enhancement is identified.

## 2019-11-15 MED ORDER — GADOBUTROL 1 MMOL/ML IV SOLN
9.0000 mL | Freq: Once | INTRAVENOUS | Status: AC | PRN
  Administered 2019-11-15: 9 mL via INTRAVENOUS

## 2019-11-18 ENCOUNTER — Encounter: Payer: Self-pay | Admitting: Internal Medicine

## 2019-11-18 NOTE — Telephone Encounter (Signed)
This encounter was created in error - please disregard.

## 2019-11-22 ENCOUNTER — Other Ambulatory Visit: Payer: Self-pay

## 2019-11-22 ENCOUNTER — Inpatient Hospital Stay: Payer: BC Managed Care – PPO

## 2019-11-22 ENCOUNTER — Other Ambulatory Visit: Payer: Self-pay | Admitting: Internal Medicine

## 2019-11-22 ENCOUNTER — Inpatient Hospital Stay: Payer: BC Managed Care – PPO | Attending: Internal Medicine | Admitting: Internal Medicine

## 2019-11-22 VITALS — BP 148/94 | HR 51 | Temp 97.9°F | Resp 18 | Ht 72.0 in | Wt 203.8 lb

## 2019-11-22 DIAGNOSIS — Z9221 Personal history of antineoplastic chemotherapy: Secondary | ICD-10-CM | POA: Diagnosis not present

## 2019-11-22 DIAGNOSIS — N400 Enlarged prostate without lower urinary tract symptoms: Secondary | ICD-10-CM | POA: Insufficient documentation

## 2019-11-22 DIAGNOSIS — Z87891 Personal history of nicotine dependence: Secondary | ICD-10-CM | POA: Diagnosis not present

## 2019-11-22 DIAGNOSIS — C710 Malignant neoplasm of cerebrum, except lobes and ventricles: Secondary | ICD-10-CM

## 2019-11-22 DIAGNOSIS — Z7952 Long term (current) use of systemic steroids: Secondary | ICD-10-CM | POA: Insufficient documentation

## 2019-11-22 DIAGNOSIS — R5383 Other fatigue: Secondary | ICD-10-CM | POA: Diagnosis not present

## 2019-11-22 DIAGNOSIS — Z923 Personal history of irradiation: Secondary | ICD-10-CM | POA: Diagnosis not present

## 2019-11-22 DIAGNOSIS — Q8500 Neurofibromatosis, unspecified: Secondary | ICD-10-CM | POA: Insufficient documentation

## 2019-11-22 LAB — CBC WITH DIFFERENTIAL (CANCER CENTER ONLY)
Abs Immature Granulocytes: 0.18 10*3/uL — ABNORMAL HIGH (ref 0.00–0.07)
Basophils Absolute: 0.1 10*3/uL (ref 0.0–0.1)
Basophils Relative: 1 %
Eosinophils Absolute: 0.1 10*3/uL (ref 0.0–0.5)
Eosinophils Relative: 1 %
HCT: 45.3 % (ref 39.0–52.0)
Hemoglobin: 16 g/dL (ref 13.0–17.0)
Immature Granulocytes: 2 %
Lymphocytes Relative: 22 %
Lymphs Abs: 1.7 10*3/uL (ref 0.7–4.0)
MCH: 31.3 pg (ref 26.0–34.0)
MCHC: 35.3 g/dL (ref 30.0–36.0)
MCV: 88.5 fL (ref 80.0–100.0)
Monocytes Absolute: 0.9 10*3/uL (ref 0.1–1.0)
Monocytes Relative: 12 %
Neutro Abs: 4.6 10*3/uL (ref 1.7–7.7)
Neutrophils Relative %: 62 %
Platelet Count: 257 10*3/uL (ref 150–400)
RBC: 5.12 MIL/uL (ref 4.22–5.81)
RDW: 12 % (ref 11.5–15.5)
WBC Count: 7.5 10*3/uL (ref 4.0–10.5)
nRBC: 0 % (ref 0.0–0.2)

## 2019-11-22 LAB — CMP (CANCER CENTER ONLY)
ALT: 22 U/L (ref 0–44)
AST: 13 U/L — ABNORMAL LOW (ref 15–41)
Albumin: 3.8 g/dL (ref 3.5–5.0)
Alkaline Phosphatase: 55 U/L (ref 38–126)
Anion gap: 8 (ref 5–15)
BUN: 14 mg/dL (ref 6–20)
CO2: 29 mmol/L (ref 22–32)
Calcium: 9.9 mg/dL (ref 8.9–10.3)
Chloride: 100 mmol/L (ref 98–111)
Creatinine: 0.89 mg/dL (ref 0.61–1.24)
GFR, Estimated: >60 mL/min (ref >60)
Glucose, Bld: 86 mg/dL (ref 70–99)
Potassium: 3.8 mmol/L (ref 3.5–5.1)
Sodium: 137 mmol/L (ref 135–145)
Total Bilirubin: 0.7 mg/dL (ref 0.3–1.2)
Total Protein: 7.2 g/dL (ref 6.5–8.1)

## 2019-11-22 MED ORDER — TEMOZOLOMIDE 100 MG PO CAPS: 400.0000 mg | ORAL_CAPSULE | Freq: Every day | ORAL | 0 refills | Status: AC

## 2019-11-22 NOTE — Progress Notes (Signed)
[facility] [place], [place] [address] [phone]   Interval Evaluation  Date of Service: 11/22/19 Patient Name: Aaron Espinoza Patient MRN: [id number] Patient DOB: 1975/03/21 Provider: Ventura Sellers, MD  Identifying Statement:  Aaron Espinoza is a 44 y.o. male with right thalamic anaplastic astrocytoma   Oncologic History: Oncology History  Anaplastic astrocytoma of thalamus ([place])  03/17/2019 Surgery   Stereotactic biopsy by Dr. Marcello Moores; path demonstrates anaplastic astrocytoma   04/12/2019 - 05/21/2019 Radiation Therapy   IMRT with concurrent Temodar 85m/m2 daily   06/14/2019 -  Chemotherapy   The patient had dexamethasone (DECADRON) 4 MG tablet, 1 of 1 cycle, Start date: --, End date: -- temozolomide (TEMODAR) 5 MG capsule, 200 mg/m2/day = 410 mg, Oral, Daily, 1 of 1 cycle, Start date: --, End date: -- temozolomide (TEMODAR) 20 MG capsule, 20 mg, , , 1 of 1 cycle, Start date: 08/30/2019, End date: -- Dose modification: 20 mg (original dose 20 mg, Cycle 1) temozolomide (TEMODAR) 100 MG capsule, 400 mg, Oral, Daily, 1 of 1 cycle, Start date: 10/25/2019, End date: 10/30/2019 Dose modification: 100 mg (original dose 100 mg, Cycle 1), 400 mg (original dose 100 mg, Cycle 1) temozolomide (TEMODAR) 140 MG capsule, 280 mg (100 % of original dose 280 mg), , , 1 of 1 cycle, Start date: 08/30/2019, End date: -- Dose modification: 280 mg (original dose 280 mg, Cycle 1)  for chemotherapy treatment.      Biomarkers:  MGMT Unknown.  IDH 1/2 Wild type.  EGFR Unknown  TERT Unknown   Interval History:  Aaron Espinoza to clinic for follow up now having completed cycle #5 of 5-day TMZ.  He describes further increase in left sided weakness, dragging left leg while walking.  Wife notes he is swinging his left arm much less than before. Fatigue is otherwise stable from prior. No other issues with chemotherapy.  Left visual  impairment is static. [place].  Decadron has been decreased to 181mdaily from 59m6mrior.  H+P (03/30/19) Patient presented to medical attention in late January, when he noticed mild confusion, gait imbalance.  These symptoms worsened over the next ~10 days, and were accompanied by incontinence.  Brain MRI was performed and demonstrated a right thalamic mass consistent with likely primary brain tumor.  He underwent stereotactic biopsy and ventriculostomy on 03/17/19 with Dr. ThoMarcello MooresFollowing surgery he describes considerable improvement in most of his symptoms; at this time he is near baseline function but "very fatigued" overall.  No seizures or headaches since surgery.  Medications: Current Outpatient Medications on File Prior to Visit  Medication Sig Dispense Refill  . acetaminophen (TYLENOL) 500 MG tablet Take 1,000 mg by mouth every 6 (six) hours as needed.    . dMarland Kitchenxamethasone (DECADRON) 2 MG tablet Take 2 tablets (4 mg total) by mouth daily. 60 tablet 1  . docusate sodium (COLACE) 100 MG capsule Take 100 mg by mouth daily.    . iMarland Kitchenuprofen (ADVIL) 200 MG tablet Take 800 mg by mouth every 6 (six) hours as needed.    . NON FORMULARY Take 2 capsules by mouth daily. [employer] CORDYCEPS for Energy Support    . NON FORMULARY Take 2 capsules by mouth daily. [employer] BRAIN for Mental Clarity    . NON FORMULARY Take 2 capsules by mouth daily. [employer] STAMETS 7 for Daily Immune Support    .  NON FORMULARY Take 2 capsules by mouth daily. [employer] [place] TAIL for Immune Support    . ondansetron (ZOFRAN) 8 MG tablet Take 1 tablet (8 mg total) by mouth 2 (two) times daily as needed (nausea and vomiting). May take 30-60 minutes prior to Temodar administration if nausea/vomiting occurs. (Patient not taking: Reported on 09/20/2019) 30 tablet 1  . pantoprazole (PROTONIX) 40 MG tablet TAKE 1 TABLET BY MOUTH EVERY DAY 30 tablet 3  .  polyethylene glycol (MIRALAX / GLYCOLAX) 17 g packet Take 1 packet by mouth daily.    Marland Kitchen temozolomide (TEMODAR) 140 MG capsule TAKE 2 CAPSULES BY MOUTH DAILY FOR 5 DAYS. TAKE ON DAYS 1-5 OF CYCLE 1 OF TREATMENT. MAY TAKE ON AN EMPTY STOMACH TO DECREASE NAUSEA & VING. (Patient not taking: Reported on 09/20/2019) 10 capsule 0  . temozolomide (TEMODAR) 20 MG capsule TAKE 1 CAPSULE BY MOUTH DAILY FOR 5 DAYS. TAKE ON DAYS 1-5 OF CYCLE 1 OF TREATMENT. MAY TAKE ON AN EMPTY STOMACH TO DECREASE NAUSEA & VONG. (Patient not taking: Reported on 09/20/2019) 5 capsule 0   No current facility-administered medications on file prior to visit.    Allergies:  Allergies  Allergen Reactions  . Gadolinium Derivatives Itching   Past Medical History:  Past Medical History:  Diagnosis Date  . BPH (benign prostatic hyperplasia) 07/20/2015  . Brain mass   . Cognitive dysfunction 01/2019  . Fatigue 01/2019  . Headache   . 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 HAND SURGERY Left    pinky - tumor benign  . LEG SURGERY Left    inner thigh tumor - benign  . VENTRICULOSTOMY N/A 03/17/2019   Procedure: ENDOSCOPIC THIRD VENTRICULOSTOMY;  Surgeon: Vallarie Mare, MD;  Location: [facility];  Service: Neurosurgery;  Laterality: N/A;  ENDOSCOPIC THIRD VENTRICULOSTOMY   Social History:  Social History   Socioeconomic History  . Marital status: Married    Spouse name: Not on file  . Number of children: Not on file  . Years of education: Not on file  . Highest education level: Not on file  Occupational History  . Not on file  Tobacco Use  . Smoking status: Former Smoker    Types: Cigars, Cigarettes  . Smokeless tobacco: Never Used  . Tobacco comment: Quit smoking cigarettes 25 yrs ago, quit cigars in 2020  Vaping Use  . Vaping Use: Never used  Substance and Sexual Activity  . Alcohol use: Yes    Alcohol/week: 6.0 - 9.0  standard drinks    Types: 6 - 9 Standard drinks or equivalent per week  . Drug use: No  . Sexual activity: Not on file  Other Topics Concern  . Not on file  Social History Narrative  . Not on file   Social Determinants of Health   Financial Resource Strain:   . Difficulty of Paying Living Expenses: Not on file  Food Insecurity:   . Worried About Charity fundraiser in the Last Year: Not on file  . Ran Out of Food in the Last Year: Not on file  Transportation Needs:   . Lack of Transportation (Medical): Not on file  . Lack of Transportation (Non-Medical): Not on file  Physical Activity:   . Days of Exercise per Week: Not on file  . Minutes of Exercise per Session: Not on file  Stress:   . Feeling of Stress : Not  on file  Social Connections:   . Frequency of Communication with Friends and Family: Not on file  . Frequency of Social Gatherings with Friends and Family: Not on file  . Attends Religious Services: Not on file  . Active Member of Clubs or Organizations: Not on file  . Attends Archivist Meetings: Not on file  . Marital Status: Not on file  Intimate Partner Violence:   . Fear of Current or Ex-Partner: Not on file  . Emotionally Abused: Not on file  . Physically Abused: Not on file  . Sexually Abused: Not on file   Family History:  Family History  Problem Relation Age of Onset  . Cancer Paternal Grandfather        Prostate    Review of Systems: Constitutional: Doesn't report fevers, chills or abnormal weight loss Eyes: Doesn't report blurriness of vision Ears, nose, mouth, throat, and face: Doesn't report sore throat Respiratory: Doesn't report cough, dyspnea or wheezes Cardiovascular: Doesn't report palpitation, chest discomfort  Gastrointestinal:  Doesn't report nausea, constipation, diarrhea GU: Doesn't report incontinence Skin: Doesn't report skin rashes Neurological: Per HPI Musculoskeletal: Doesn't report joint pain Behavioral/Psych:  Doesn't report anxiety  Physical Exam: Vitals:   11/22/19 1118  BP: (!) 148/94  Pulse: (!) 51  Resp: 18  Temp: 97.9 F (36.6 C)  SpO2: 100%   KPS: 70. General: ++fatigue Head: Normal EENT: No conjunctival injection or scleral icterus.  Lungs: Resp effort normal Cardiac: Regular rate Abdomen: Non-distended abdomen Skin: No rashes cyanosis or petechiae. Extremities: No clubbing or edema  Neurologic Exam: Mental Status: Awake, alert, attentive to examiner. Oriented to self and environment. Language is fluent with intact comprehension.  Cranial Nerves: Visual acuity is grossly normal. L visual field impairment. Extra-ocular movements intact. No ptosis. Face is symmetric Motor: Tone and bulk are normal. Power is 4+/5 in left arm and leg. Reflexes are symmetric, no pathologic reflexes present.  Sensory: Intact to light touch Gait: Hemiparetic   Labs: I have reviewed the data as listed    Component Value Date/Time   NA 138 10/25/2019 1209   K 4.1 10/25/2019 1209   CL 100 10/25/2019 1209   CO2 32 10/25/2019 1209   GLUCOSE 74 10/25/2019 1209   BUN 17 10/25/2019 1209   CREATININE 0.95 10/25/2019 1209   CREATININE 0.83 03/04/2019 1206   CALCIUM 9.7 10/25/2019 1209   PROT 7.0 10/25/2019 1209   ALBUMIN 3.9 10/25/2019 1209   AST 13 (L) 10/25/2019 1209   ALT 23 10/25/2019 1209   ALKPHOS 56 10/25/2019 1209   BILITOT 0.9 10/25/2019 1209   GFRNONAA >60 10/25/2019 1209   GFRNONAA 106 03/04/2019 1206   GFRAA >60 10/25/2019 1209   GFRAA 123 03/04/2019 1206   Lab Results  Component Value Date   WBC 7.5 11/22/2019   NEUTROABS 4.6 11/22/2019   HGB 16.0 11/22/2019   HCT 45.3 11/22/2019   MCV 88.5 11/22/2019   PLT 257 11/22/2019   Imaging:  [facility] Clinician Interpretation: I have personally reviewed the CNS images as listed.  My interpretation, in the context of the patient's clinical presentation, is treatment effect vs true progression  Aaron Espinoza BRAIN W WO CONTRAST  Result Date:  11/15/2019 CLINICAL DATA:  44 year old male with right thalamic anaplastic astrocytoma. Status posterior tactic biopsy in February, radiation treatment completed in April, chemotherapy. Restaging. EXAM: MRI HEAD WITHOUT AND WITH CONTRAST TECHNIQUE: Multiplanar, multiecho pulse sequences of the brain and surrounding structures were obtained without and with intravenous contrast. CONTRAST:  71m GADAVIST GADOBUTROL 1 MMOL/ML IV SOLN COMPARISON:  Brain MRI 09/14/2019 and earlier. FINDINGS: Brain: Clear enlargement since July of the T2 and FLAIR hyperintense lesion centered at the right thalamus and involving the right midbrain. Transaxially, the dominant T2 hyperintense component now measures up to 38 mm (series 11, image 15) versus 33 mm in July. On coronal T2 the confluent signal expanding the thalamus and into the midbrain is now up to of 46 mm craniocaudal (series 15, image 14 today), versus 39-40 mm in July. DWI abnormality has mildly increased since July but the majority of the lesion remains facilitated on diffusion. Evidence of microhemorrhage within the mass has also increased since July. Similar progression of nodular and irregular postcontrast enhancement, most notably along the midline and superior margins of the lesion (series 17, image 14 today versus series 17, image 16 and July), now more contiguous with the right lateral ventricle. Confluent involvement and enhancement within the tail of the right hippocampus again noted. Chronic ependymal enhancement of the 3rd ventricle has not significantly changed. And no remote ependymal or leptomeningeal enhancement is identified. No ventriculomegaly. Associated progression of T2 and FLAIR hyperintensity tracking into the right frontal and temporal lobes. Comparatively mild increased T2 and FLAIR hyperintensity in the right brainstem. No superimposed restricted diffusion suggestive of acute infarction. No significant midline shift. Basilar cisterns remain patent.  Superimposed linear probable post biopsy related hemosiderin tracking laterally from the lesion is stable. No acute intracranial hemorrhage. Previous right superior ventriculostomy. Cervicomedullary junction and pituitary are within normal limits. Vascular: Major intracranial vascular flow voids are stable. Dominant right vertebral artery. The major dural venous sinuses are enhancing and appear to be patent. Skull and upper cervical spine: Stable, negative. Sinuses/Orbits: Stable, negative. Other: Mastoids remain clear. Visible internal auditory structures appear normal. Stable scalp and face. IMPRESSION: 1. Increased size and enhancement of the heterogeneous mass centered at the right thalamus and involving the right midbrain. Increased surrounding T2 and FLAIR hyperintensity. The appearance is suspicious for true progression although post treatment pseudoprogression is possible. 2. No ventriculomegaly or significant intracranial mass effect at this time. Ependymal enhancement of the 3rd ventricle has not significantly changed. And no remote ependymal or leptomeningeal enhancement is identified. Electronically Signed   By: HGenevie AnnM.D.   On: 11/15/2019 23:38   Assessment/Plan [place] [C71.0[place]  Aaron Espinoza. PWoldpresents today having completed cycle #5 5-day Temozolomide.  MRI brain demonstrates probable progression of disease, with increased volume of enhancing burden.  Treatment effect does still need to be considered given the non-specific nature of tumor enlargement (ie no new nodules or infiltrative components) and the recent sharp decrease in dexamethasone dosage.    We recommended increasing decadron to 453mdaily, after 3 day "boost" of 37m27maily to address clinical symptoms.   We then recommended continuing with one additional cycle (#6) Temozolomide 200 mg/m2, on for five days and off for twenty three days in twenty eight day cycles. The patient will have a complete blood  count performed on days 21 and 28 of each cycle, and a comprehensive metabolic panel performed on day 28 of each cycle. Labs may need to be performed more often. Zofran will prescribed for home use for nausea/vomiting.   Chemotherapy should be held for the following:  ANC less than 1,000  Platelets less than 100,000  LFT or creatinine greater than 2x ULN  If clinical concerns/contraindications develop  We ask that JasSHAUNTE WEISSINGERturn to clinic in 1  months following next brain MRI, which will be needed to assess recent progressive changes.  Consideration will be given at that time to salvage chemotherapy or clinical trial.  We also discussed and patient consented for additional tumor profiling and sequencing through [facility].  Advanced tumor profiling could help identify actionable mutation for targeted therapy and lead to direct clinical benefit.     We appreciate the opportunity to participate in the care of Aaron Espinoza.   All questions were answered. The patient knows to call the clinic with any problems, questions or concerns. No barriers to learning were detected.  I have spent a total of 40 minutes of face-to-face and non-face-to-face time, excluding clinical staff time, preparing to see patient, ordering tests and/or medications, counseling the patient, and independently interpreting results and communicating results to the patient/family/caregiver   Ventura Sellers, MD Medical Director of Neuro-Oncology [facility] [place] 11/22/19 11:19 AM

## 2019-11-23 ENCOUNTER — Telehealth: Payer: Self-pay | Admitting: Internal Medicine

## 2019-11-23 NOTE — Telephone Encounter (Signed)
Scheduled per 10/18 los. Pt is aware of appt times and date.

## 2019-11-25 LAB — COLOGUARD: Cologuard: NEGATIVE

## 2019-11-26 ENCOUNTER — Telehealth: Payer: Self-pay | Admitting: *Deleted

## 2019-11-26 NOTE — Telephone Encounter (Signed)
NEW [facility] Tumor Requisition submitted via fax to [phone]

## 2019-11-30 ENCOUNTER — Telehealth: Payer: Self-pay | Admitting: *Deleted

## 2019-11-30 NOTE — Telephone Encounter (Signed)
Updated copy of [facility] Tumor Profile request on new form faxed [phone]

## 2019-11-30 NOTE — Telephone Encounter (Signed)
[facility] paperwork processed and faxed [phone]. Copy mailed to patient.

## 2019-12-04 ENCOUNTER — Other Ambulatory Visit: Payer: Self-pay | Admitting: Internal Medicine

## 2019-12-06 ENCOUNTER — Encounter: Payer: Self-pay | Admitting: Internal Medicine

## 2019-12-06 NOTE — Telephone Encounter (Signed)
Please see rx refill

## 2019-12-07 ENCOUNTER — Other Ambulatory Visit: Payer: Self-pay | Admitting: Radiation Therapy

## 2019-12-07 DIAGNOSIS — C710 Malignant neoplasm of cerebrum, except lobes and ventricles: Secondary | ICD-10-CM | POA: Diagnosis not present

## 2019-12-08 ENCOUNTER — Other Ambulatory Visit: Payer: Self-pay | Admitting: Internal Medicine

## 2019-12-08 DIAGNOSIS — C710 Malignant neoplasm of cerebrum, except lobes and ventricles: Secondary | ICD-10-CM

## 2019-12-08 NOTE — Telephone Encounter (Signed)
Refill request

## 2019-12-12 DIAGNOSIS — C710 Malignant neoplasm of cerebrum, except lobes and ventricles: Secondary | ICD-10-CM | POA: Diagnosis not present

## 2019-12-22 ENCOUNTER — Other Ambulatory Visit: Payer: Self-pay

## 2019-12-22 ENCOUNTER — Ambulatory Visit (HOSPITAL_COMMUNITY)
Admission: RE | Admit: 2019-12-22 | Discharge: 2019-12-22 | Disposition: A | Discharge status: Home / Self Care | Payer: BC Managed Care – PPO | Source: Ambulatory Visit | Attending: Internal Medicine | Admitting: Internal Medicine

## 2019-12-22 DIAGNOSIS — C710 Malignant neoplasm of cerebrum, except lobes and ventricles: Secondary | ICD-10-CM | POA: Insufficient documentation

## 2019-12-22 DIAGNOSIS — G9389 Other specified disorders of brain: Secondary | ICD-10-CM | POA: Diagnosis not present

## 2019-12-22 DIAGNOSIS — G919 Hydrocephalus, unspecified: Secondary | ICD-10-CM | POA: Diagnosis not present

## 2019-12-22 DIAGNOSIS — I618 Other nontraumatic intracerebral hemorrhage: Secondary | ICD-10-CM | POA: Diagnosis not present

## 2019-12-22 DIAGNOSIS — C719 Malignant neoplasm of brain, unspecified: Secondary | ICD-10-CM | POA: Diagnosis not present

## 2019-12-22 IMAGING — MR MR HEAD WO/W CM
16 series · 48 of 48 positions shown · IV contrast (gadavist)
Comparison: MRI head [DATE]

CLINICAL DATA: Anaplastic astrocytoma right thalamus. Assess
treatment response.

EXAM:
MRI HEAD WITHOUT AND WITH CONTRAST
TECHNIQUE: Multiplanar, multiecho pulse sequences of the brain and surrounding
structures were obtained without and with intravenous contrast.
CONTRAST:  9mL GADAVIST GADOBUTROL 1 MMOL/ML IV SOLN. Patient
received premedication for contrast allergy.

[Series 5: DWI · axial · 3.0mm · 1.36mm/px · z∈[-62,+107]mm · 3 of 116 slices shown (1 of 4)]
[im 1/116]
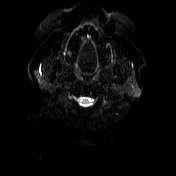
[im 58/116]
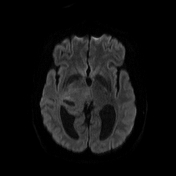
[im 116/116]
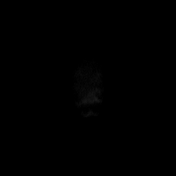

[Series 6: DWI · axial · 3.0mm · 1.36mm/px · z∈[-62,+107]mm · 2 of 58 slices shown (2 of 4)]
[im 1/58]
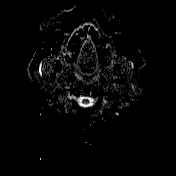
[im 58/58]
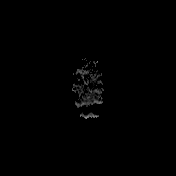

[Series 7: mip_images(sw) · axial · 24.0mm · 0.75mm/px · z∈[-48,+94]mm · 2 of 49 slices shown]
[im 1/49]
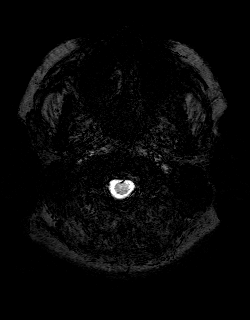
[im 49/49]
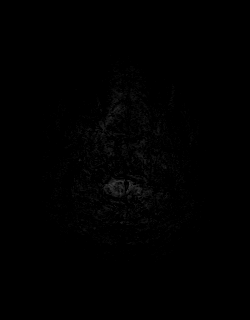

[Series 8: swi_images · axial · 3.0mm · 0.75mm/px · z∈[-59,+104]mm · 2 of 56 slices shown]
[im 1/56]
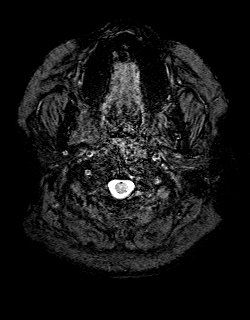
[im 56/56]
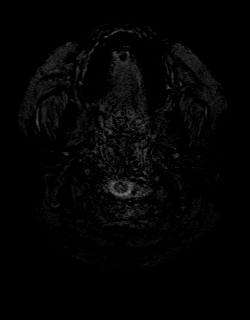

[Series 9: FLAIR · axial · 3.0mm · 0.75mm/px · z∈[-60,+107]mm · 2 of 57 slices shown]
[im 1/57]
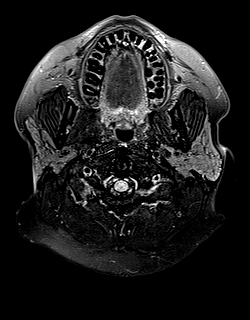
[im 57/57]
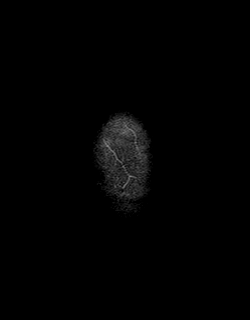

[Series 10: DWI · coronal · 5.0mm · 1.31mm/px · 3 of 79 slices shown (3 of 4)]
[im 1/79]
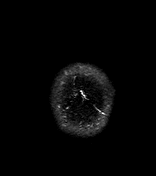
[im 40/79]
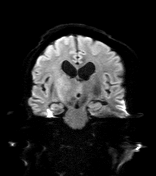
[im 79/79]
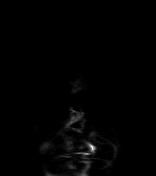

[Series 11: DWI · coronal · 5.0mm · 1.31mm/px · 2 of 40 slices shown (4 of 4)]
[im 1/40]
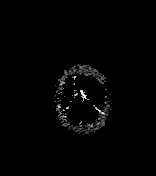
[im 40/40]
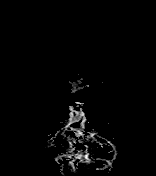

[Series 12: T1 · sagittal · 5.0mm · 0.75mm/px · 1 of 26 slices shown (1 of 2)]
[im 1/26]
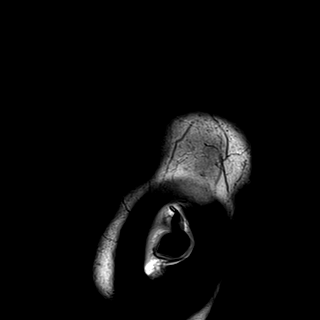

[Series 13: T2 · axial · 5.0mm · 0.62mm/px · 1 of 28 slices shown]
[im 1/28]
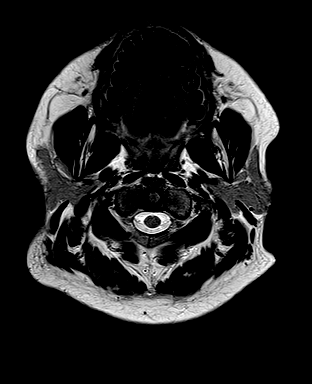

[Series 14: T1 · axial · 1.0mm · 0.94mm/px · z∈[-66,+107]mm · 7 of 176 slices shown (2 of 2)]
[im 1/176]
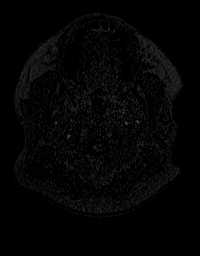
[im 30/176]
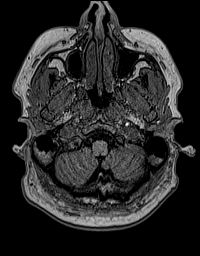
[im 59/176]
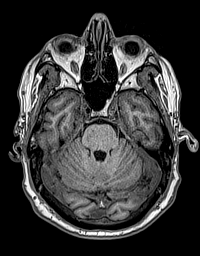
[im 88/176]
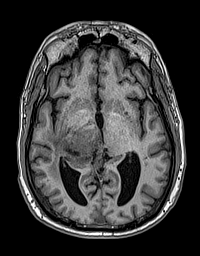
[im 117/176]
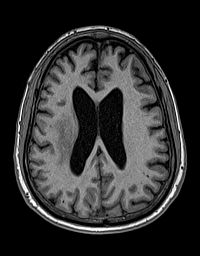
[im 146/176]
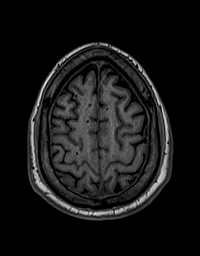
[im 176/176]
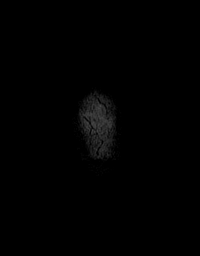

[Series 15: T2 post-contrast · coronal · 5.0mm · 0.57mm/px · 1 of 30 slices shown]
[im 1/30]
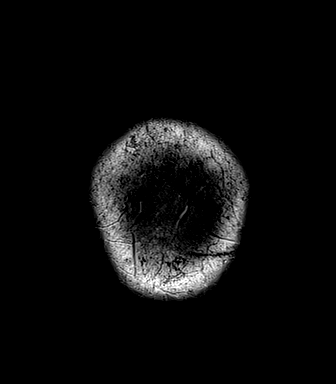

[Series 16: T1 post-contrast · axial · 1.0mm · 0.94mm/px · z∈[-66,+107]mm · 7 of 176 slices shown (1 of 3)]
[im 1/176]
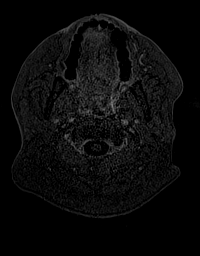
[im 30/176]
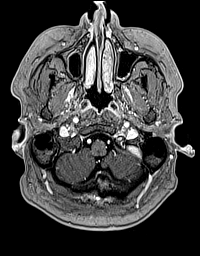
[im 59/176]
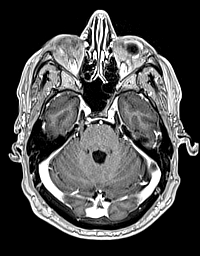
[im 88/176]
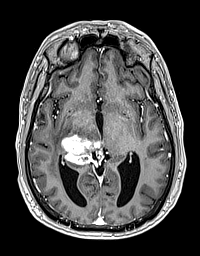
[im 117/176]
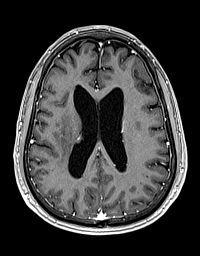
[im 146/176]
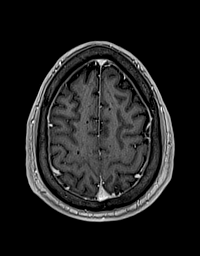
[im 176/176]
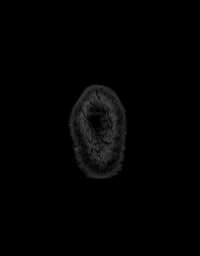

[Series 17: T1 post-contrast · coronal · 5.0mm · 0.43mm/px · 1 of 30 slices shown (2 of 3)]
[im 1/30]
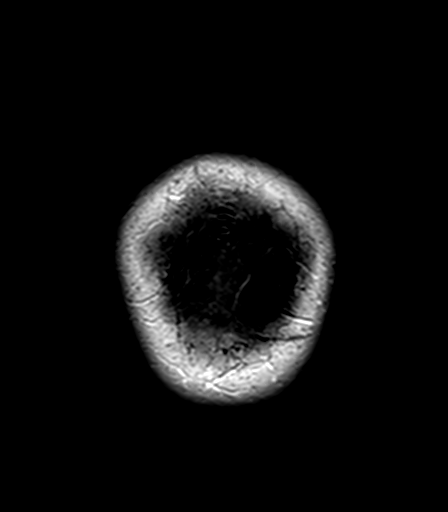

[Series 18: T1 post-contrast · sagittal · 5.0mm · 0.75mm/px · 1 of 26 slices shown (3 of 3)]
[im 1/26]
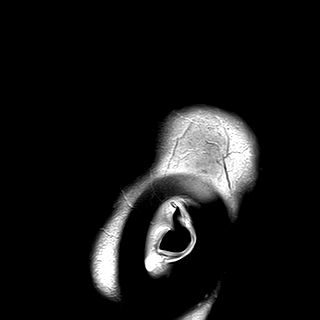

[Series 100: <mpr range> · axial · 1.0mm · 0.50mm/px · z∈[+21,+138]mm · 6 of 152 slices shown]
[im 1/152]
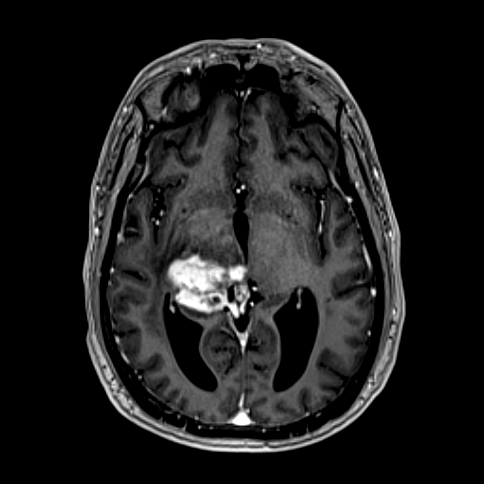
[im 31/152]
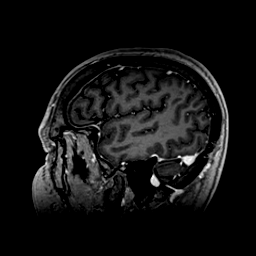
[im 61/152]
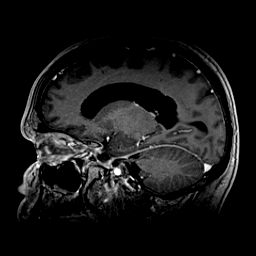
[im 91/152]
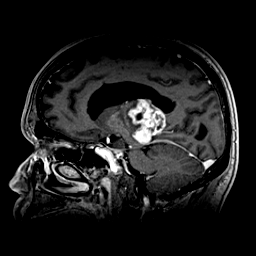
[im 121/152]
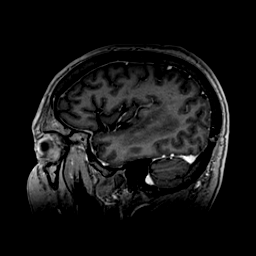
[im 152/152]
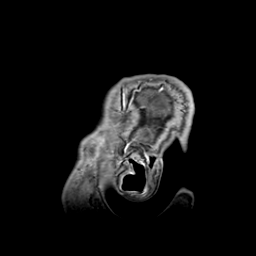

[Series 101: <mpr range(1)> · axial · 1.0mm · 0.50mm/px · z∈[+21,+131]mm · 7 of 177 slices shown]
[im 1/177]
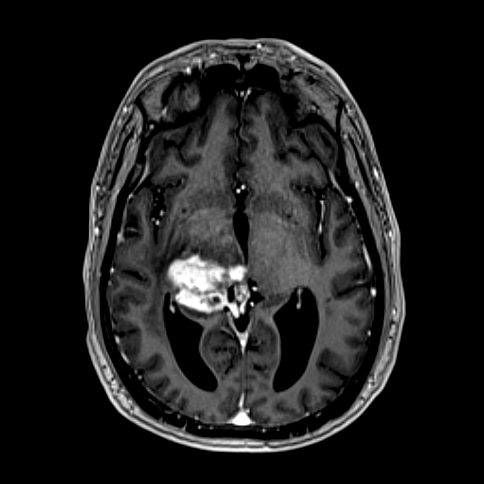
[im 30/177]
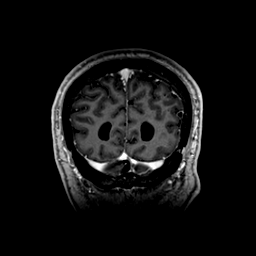
[im 59/177]
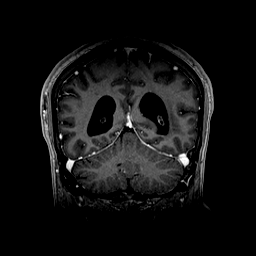
[im 89/177]
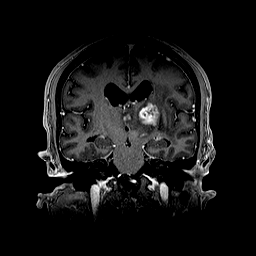
[im 118/177]
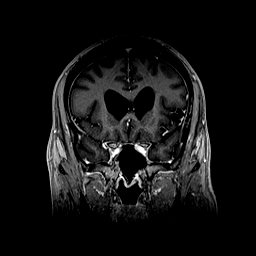
[im 147/177]
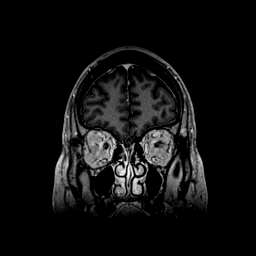
[im 177/177]
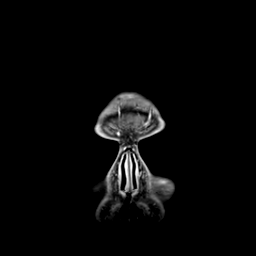

[48 of 48 positions shown; findings below may reference images not displayed]

FINDINGS: Brain: Enhancing mass lesion centered in the right thalamus appears
unchanged. The mass shows areas of central necrosis and punctate
hemorrhage unchanged. The mass appears to be invading the middle
cerebral peduncle and midbrain on the right. There is also
enhancement along the fourth ventricle unchanged. There is
enhancement along the lateral wall of the third ventricle on the
right unchanged. T2 and FLAIR hyperintensity surrounding the mass
unchanged. There is mass-effect with mild midline shift to the left
involving the thalamus which is unchanged.

Mild ventricular enlargement likely due to obstruction of the level
the aqueduct is unchanged. Right frontal burr hole with ventricular
drain tract on the right. No drain in place at this time. Negative
for acute infarct.

Vascular: Normal arterial flow voids.

Skull and upper cervical spine: No acute skeletal abnormality.

Sinuses/Orbits: Paranasal sinuses clear.  Negative orbit.

Other: None
IMPRESSION: No change in enhancing mass centered in the right thalamus. There is
extension into the right cerebral peduncle and midbrain on the
right. There is enhancement along the aqueduct and lateral wall of
third ventricle likely due to transependymal spread of tumor. This
is causing mild hydrocephalus, stable.

## 2019-12-22 MED ORDER — GADOBUTROL 1 MMOL/ML IV SOLN
9.0000 mL | Freq: Once | INTRAVENOUS | Status: AC | PRN
  Administered 2019-12-22: 9 mL via INTRAVENOUS

## 2019-12-27 ENCOUNTER — Inpatient Hospital Stay: Payer: BC Managed Care – PPO | Attending: Internal Medicine | Admitting: Internal Medicine

## 2019-12-27 ENCOUNTER — Inpatient Hospital Stay: Payer: BC Managed Care – PPO

## 2019-12-27 ENCOUNTER — Other Ambulatory Visit: Payer: Self-pay

## 2019-12-27 VITALS — BP 150/102 | HR 58 | Temp 97.5°F | Resp 18 | Ht 72.0 in | Wt 205.6 lb

## 2019-12-27 DIAGNOSIS — C710 Malignant neoplasm of cerebrum, except lobes and ventricles: Secondary | ICD-10-CM

## 2019-12-27 DIAGNOSIS — Z9221 Personal history of antineoplastic chemotherapy: Secondary | ICD-10-CM | POA: Insufficient documentation

## 2019-12-27 DIAGNOSIS — Z79899 Other long term (current) drug therapy: Secondary | ICD-10-CM | POA: Insufficient documentation

## 2019-12-27 DIAGNOSIS — Z87891 Personal history of nicotine dependence: Secondary | ICD-10-CM | POA: Insufficient documentation

## 2019-12-27 DIAGNOSIS — N400 Enlarged prostate without lower urinary tract symptoms: Secondary | ICD-10-CM | POA: Diagnosis not present

## 2019-12-27 DIAGNOSIS — G919 Hydrocephalus, unspecified: Secondary | ICD-10-CM | POA: Insufficient documentation

## 2019-12-27 DIAGNOSIS — R5383 Other fatigue: Secondary | ICD-10-CM | POA: Diagnosis not present

## 2019-12-27 DIAGNOSIS — Z923 Personal history of irradiation: Secondary | ICD-10-CM | POA: Insufficient documentation

## 2019-12-27 DIAGNOSIS — Q8500 Neurofibromatosis, unspecified: Secondary | ICD-10-CM | POA: Diagnosis not present

## 2019-12-27 LAB — CMP (CANCER CENTER ONLY)
ALT: 20 U/L (ref 0–44)
AST: 13 U/L — ABNORMAL LOW (ref 15–41)
Albumin: 3.9 g/dL (ref 3.5–5.0)
Alkaline Phosphatase: 54 U/L (ref 38–126)
Anion gap: 9 (ref 5–15)
BUN: 15 mg/dL (ref 6–20)
CO2: 29 mmol/L (ref 22–32)
Calcium: 9.7 mg/dL (ref 8.9–10.3)
Chloride: 101 mmol/L (ref 98–111)
Creatinine: 0.96 mg/dL (ref 0.61–1.24)
GFR, Estimated: >60 mL/min (ref >60)
Glucose, Bld: 88 mg/dL (ref 70–99)
Potassium: 4 mmol/L (ref 3.5–5.1)
Sodium: 139 mmol/L (ref 135–145)
Total Bilirubin: 0.7 mg/dL (ref 0.3–1.2)
Total Protein: 7.3 g/dL (ref 6.5–8.1)

## 2019-12-27 LAB — CBC WITH DIFFERENTIAL (CANCER CENTER ONLY)
Abs Immature Granulocytes: 0.24 10*3/uL — ABNORMAL HIGH (ref 0.00–0.07)
Basophils Absolute: 0 10*3/uL (ref 0.0–0.1)
Basophils Relative: 0 %
Eosinophils Absolute: 0 10*3/uL (ref 0.0–0.5)
Eosinophils Relative: 0 %
HCT: 46.8 % (ref 39.0–52.0)
Hemoglobin: 16.4 g/dL (ref 13.0–17.0)
Immature Granulocytes: 3 %
Lymphocytes Relative: 19 %
Lymphs Abs: 1.8 10*3/uL (ref 0.7–4.0)
MCH: 31.7 pg (ref 26.0–34.0)
MCHC: 35 g/dL (ref 30.0–36.0)
MCV: 90.3 fL (ref 80.0–100.0)
Monocytes Absolute: 0.9 10*3/uL (ref 0.1–1.0)
Monocytes Relative: 10 %
Neutro Abs: 6.5 10*3/uL (ref 1.7–7.7)
Neutrophils Relative %: 68 %
Platelet Count: 246 10*3/uL (ref 150–400)
RBC: 5.18 MIL/uL (ref 4.22–5.81)
RDW: 12.1 % (ref 11.5–15.5)
WBC Count: 9.5 10*3/uL (ref 4.0–10.5)
nRBC: 0 % (ref 0.0–0.2)

## 2019-12-27 MED ORDER — TEMOZOLOMIDE 100 MG PO CAPS: 400.0000 mg | ORAL_CAPSULE | Freq: Every day | ORAL | 0 refills | Status: AC

## 2019-12-27 NOTE — Progress Notes (Signed)
[facility] [place], [place] [address] [phone]   Interval Evaluation  Date of Service: 12/27/19 Patient Name: Aaron Espinoza Patient MRN: [id number] Patient DOB: [birth year] Provider: Ventura Sellers, MD  Identifying Statement:  Aaron Espinoza is a 44 y.o. male with right thalamic anaplastic astrocytoma   Oncologic History: Oncology History  Anaplastic astrocytoma of thalamus ([place])  03/17/2019 Surgery   Stereotactic biopsy by Dr. Marcello Moores; path demonstrates anaplastic astrocytoma   04/12/2019 - 05/21/2019 Radiation Therapy   IMRT with concurrent Temodar 10m/m2 daily   06/14/2019 -  Chemotherapy   The patient had dexamethasone (DECADRON) 4 MG tablet, 1 of 1 cycle, Start date: --, End date: -- temozolomide (TEMODAR) 5 MG capsule, 200 mg/m2/day = 410 mg, Oral, Daily, 1 of 1 cycle, Start date: --, End date: -- temozolomide (TEMODAR) 20 MG capsule, 20 mg, , , 1 of 1 cycle, Start date: 08/30/2019, End date: -- Dose modification: 20 mg (original dose 20 mg, Cycle 1) temozolomide (TEMODAR) 100 MG capsule, 400 mg, Oral, Daily, 1 of 1 cycle, Start date: 11/22/2019, End date: 11/27/2019 Dose modification: 100 mg (original dose 100 mg, Cycle 1), 400 mg (original dose 100 mg, Cycle 1) temozolomide (TEMODAR) 140 MG capsule, 280 mg (100 % of original dose 280 mg), , , 1 of 1 cycle, Start date: 08/30/2019, End date: -- Dose modification: 280 mg (original dose 280 mg, Cycle 1)  for chemotherapy treatment.      Biomarkers:  MGMT Unknown.  IDH 1/2 Wild type.  EGFR Unknown  TERT Unknown   Interval History:  Aaron DOWSONpresents to clinic for follow up now having completed cycle #7 of 5-day TMZ.  Unfortunately he describes further increase in left sided weakness, including worsened dragging of left leg while walking.  Left arm seems to be weaker as well. Fatigue is otherwise stable from prior. No other issues tolerating chemotherapy.   Decadron currently at 413mdaily.  H+P (03/30/19) Patient presented to medical attention in late January, when he noticed mild confusion, gait imbalance.  These symptoms worsened over the next ~10 days, and were accompanied by incontinence.  Brain MRI was performed and demonstrated a right thalamic mass consistent with likely primary brain tumor.  He underwent stereotactic biopsy and ventriculostomy on 03/17/19 with Dr. ThMarcello Moores Following surgery he describes considerable improvement in most of his symptoms; at this time he is near baseline function but "very fatigued" overall.  No seizures or headaches since surgery.  Medications: Current Outpatient Medications on File Prior to Visit  Medication Sig Dispense Refill  . acetaminophen (TYLENOL) 500 MG tablet Take 1,000 mg by mouth every 6 (six) hours as needed.    . Marland Kitchenexamethasone (DECADRON) 2 MG tablet TAKE 2 TABLETS (4 MG TOTAL) BY MOUTH DAILY. 60 tablet 1  . docusate sodium (COLACE) 100 MG capsule Take 100 mg by mouth daily.    . Marland Kitchenbuprofen (ADVIL) 200 MG tablet Take 800 mg by mouth every 6 (six) hours as needed.    . NON FORMULARY Take 2 capsules by mouth daily. [employer] CORDYCEPS for Energy Support    . NON FORMULARY Take 2 capsules by mouth daily. [employer] BRAIN for Mental Clarity    . NON FORMULARY Take 2 capsules by mouth daily. [employer] STAMETS 7 for Daily Immune Support    . NON FORMULARY Take 2 capsules by mouth daily. [employer] T[place]AIL for Immune Support    .  ondansetron (ZOFRAN) 8 MG tablet Take 1 tablet (8 mg total) by mouth 2 (two) times daily as needed (nausea and vomiting). May take 30-60 minutes prior to Temodar administration if nausea/vomiting occurs. (Patient not taking: Reported on 09/20/2019) 30 tablet 1  . pantoprazole (PROTONIX) 40 MG tablet TAKE 1 TABLET BY MOUTH EVERY DAY 30 tablet 3  . polyethylene glycol (MIRALAX / GLYCOLAX) 17 g packet Take 1 packet by mouth daily.    Marland Kitchen temozolomide (TEMODAR) 140 MG capsule  TAKE 2 CAPSULES BY MOUTH DAILY FOR 5 DAYS. TAKE ON DAYS 1-5 OF CYCLE 1 OF TREATMENT. MAY TAKE ON AN EMPTY STOMACH TO DECREASE NAUSEA & VING. (Patient not taking: Reported on 09/20/2019) 10 capsule 0  . temozolomide (TEMODAR) 20 MG capsule TAKE 1 CAPSULE BY MOUTH DAILY FOR 5 DAYS. TAKE ON DAYS 1-5 OF CYCLE 1 OF TREATMENT. MAY TAKE ON AN EMPTY STOMACH TO DECREASE NAUSEA & VONG. (Patient not taking: Reported on 09/20/2019) 5 capsule 0   No current facility-administered medications on file prior to visit.    Allergies:  Allergies  Allergen Reactions  . Gadolinium Derivatives Itching   Past Medical History:  Past Medical History:  Diagnosis Date  . BPH (benign prostatic hyperplasia) 07/20/2015  . Brain mass   . Cognitive dysfunction 01/2019  . Fatigue 01/2019  . Headache   . 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 HAND SURGERY Left    pinky - tumor benign  . LEG SURGERY Left    inner thigh tumor - benign  . VENTRICULOSTOMY N/A 03/17/2019   Procedure: ENDOSCOPIC THIRD VENTRICULOSTOMY;  Surgeon: Vallarie Mare, MD;  Location: [facility];  Service: Neurosurgery;  Laterality: N/A;  ENDOSCOPIC THIRD VENTRICULOSTOMY   Social History:  Social History   Socioeconomic History  . Marital status: Married    Spouse name: Not on file  . Number of children: Not on file  . Years of education: Not on file  . Highest education level: Not on file  Occupational History  . Not on file  Tobacco Use  . Smoking status: Former Smoker    Types: Cigars, Cigarettes  . Smokeless tobacco: Never Used  . Tobacco comment: Quit smoking cigarettes 25 yrs ago, quit cigars in 2020  Vaping Use  . Vaping Use: Never used  Substance and Sexual Activity  . Alcohol use: Yes    Alcohol/week: 6.0 - 9.0 standard drinks    Types: 6 - 9 Standard drinks or equivalent per week  . Drug use: No  . Sexual activity: Not on file  Other  Topics Concern  . Not on file  Social History Narrative  . Not on file   Social Determinants of Health   Financial Resource Strain:   . Difficulty of Paying Living Expenses: Not on file  Food Insecurity:   . Worried About Charity fundraiser in the Last Year: Not on file  . Ran Out of Food in the Last Year: Not on file  Transportation Needs:   . Lack of Transportation (Medical): Not on file  . Lack of Transportation (Non-Medical): Not on file  Physical Activity:   . Days of Exercise per Week: Not on file  . Minutes of Exercise per Session: Not on file  Stress:   . Feeling of Stress : Not on file  Social Connections:   . Frequency of Communication with Friends and Family: Not on file  .  Frequency of Social Gatherings with Friends and Family: Not on file  . Attends Religious Services: Not on file  . Active Member of Clubs or Organizations: Not on file  . Attends Archivist Meetings: Not on file  . Marital Status: Not on file  Intimate Partner Violence:   . Fear of Current or Ex-Partner: Not on file  . Emotionally Abused: Not on file  . Physically Abused: Not on file  . Sexually Abused: Not on file   Family History:  Family History  Problem Relation Age of Onset  . Cancer Paternal Grandfather        Prostate    Review of Systems: Constitutional: Doesn't report fevers, chills or abnormal weight loss Eyes: Doesn't report blurriness of vision Ears, nose, mouth, throat, and face: Doesn't report sore throat Respiratory: Doesn't report cough, dyspnea or wheezes Cardiovascular: Doesn't report palpitation, chest discomfort  Gastrointestinal:  Doesn't report nausea, constipation, diarrhea GU: Doesn't report incontinence Skin: Doesn't report skin rashes Neurological: Per HPI Musculoskeletal: Doesn't report joint pain Behavioral/Psych: Doesn't report anxiety  Physical Exam: Vitals:   12/27/19 1210 12/27/19 1215  BP: (!) 158/97 (!) 150/102  Pulse: (!) 58   Resp:  18   Temp: (!) 97.5 F (36.4 C)   SpO2: 100%    KPS: 70. General: ++fatigue Head: Normal EENT: No conjunctival injection or scleral icterus.  Lungs: Resp effort normal Cardiac: Regular rate Abdomen: Non-distended abdomen Skin: No rashes cyanosis or petechiae. Extremities: No clubbing or edema  Neurologic Exam: Mental Status: Awake, alert, attentive to examiner. Oriented to self and environment. Language is fluent with intact comprehension.  Cranial Nerves: Visual acuity is grossly normal. L visual field impairment. Extra-ocular movements intact. No ptosis. Face is symmetric Motor: Tone and bulk are normal. Power is 3/5 in left arm and 4/5 in left leg. Reflexes are symmetric, no pathologic reflexes present.  Sensory: Intact to light touch Gait: Hemiparetic, cane assisted   Labs: I have reviewed the data as listed    Component Value Date/Time   NA 137 11/22/2019 1040   K 3.8 11/22/2019 1040   CL 100 11/22/2019 1040   CO2 29 11/22/2019 1040   GLUCOSE 86 11/22/2019 1040   BUN 14 11/22/2019 1040   CREATININE 0.89 11/22/2019 1040   CREATININE 0.83 03/04/2019 1206   CALCIUM 9.9 11/22/2019 1040   PROT 7.2 11/22/2019 1040   ALBUMIN 3.8 11/22/2019 1040   AST 13 (L) 11/22/2019 1040   ALT 22 11/22/2019 1040   ALKPHOS 55 11/22/2019 1040   BILITOT 0.7 11/22/2019 1040   GFRNONAA >60 11/22/2019 1040   GFRNONAA 106 03/04/2019 1206   GFRAA >60 10/25/2019 1209   GFRAA 123 03/04/2019 1206   Lab Results  Component Value Date   WBC 9.5 12/27/2019   NEUTROABS 6.5 12/27/2019   HGB 16.4 12/27/2019   HCT 46.8 12/27/2019   MCV 90.3 12/27/2019   PLT 246 12/27/2019   Imaging:  [facility] Clinician Interpretation: I have personally reviewed the CNS images as listed.  My interpretation, in the context of the patient's clinical presentation, is stable disease  MR BRAIN W WO CONTRAST  Result Date: 12/23/2019 CLINICAL DATA:  Anaplastic astrocytoma right thalamus. Assess treatment response.  EXAM: MRI HEAD WITHOUT AND WITH CONTRAST TECHNIQUE: Multiplanar, multiecho pulse sequences of the brain and surrounding structures were obtained without and with intravenous contrast. CONTRAST:  24m GADAVIST GADOBUTROL 1 MMOL/ML IV SOLN. Patient received premedication for contrast allergy. COMPARISON:  MRI head 11/15/2019 FINDINGS: Brain: Enhancing  mass lesion centered in the right thalamus appears unchanged. The mass shows areas of central necrosis and punctate hemorrhage unchanged. The mass appears to be invading the middle cerebral peduncle and midbrain on the right. There is also enhancement along the fourth ventricle unchanged. There is enhancement along the lateral wall of the third ventricle on the right unchanged. T2 and FLAIR hyperintensity surrounding the mass unchanged. There is mass-effect with mild midline shift to the left involving the thalamus which is unchanged. Mild ventricular enlargement likely due to obstruction of the level the aqueduct is unchanged. Right frontal burr hole with ventricular drain tract on the right. No drain in place at this time. Negative for acute infarct. Vascular: Normal arterial flow voids. Skull and upper cervical spine: No acute skeletal abnormality. Sinuses/Orbits: Paranasal sinuses clear.  Negative orbit. Other: None IMPRESSION: No change in enhancing mass centered in the right thalamus. There is extension into the right cerebral peduncle and midbrain on the right. There is enhancement along the aqueduct and lateral wall of third ventricle likely due to transependymal spread of tumor. This is causing mild hydrocephalus, stable. Electronically Signed   By: Franchot Gallo M.D.   On: 12/23/2019 08:50    Assessment/Plan Anaplastic astrocytoma of thalamus ([place]) [C71.0]  Mr. Deskin presents today having completed cycle #7 5-day Temozolomide.  Clinically his motor function continues to decline. MRI brain fortunately demonstrates overall stability of enhancing and  non-enhancing burden.  We then recommended continuing with cycle #8 Temozolomide 200 mg/m2, on for five days and off for twenty three days in twenty eight day cycles. The patient will have a complete blood count performed on days 21 and 28 of each cycle, and a comprehensive metabolic panel performed on day 28 of each cycle. Labs may need to be performed more often. Zofran will prescribed for home use for nausea/vomiting.   Chemotherapy should be held for the following:  ANC less than 1,000  Platelets less than 100,000  LFT or creatinine greater than 2x ULN  If clinical concerns/contraindications develop  Because of clinical progression, extent of deep brain brain infiltration, very recent radiographic progression, we would like to perform another monthly MRI scan for this young patient.  With further radiographic progression, full palette of clinical trials will be surveyed, including on the [place] as discussed with patient and his wife.   Decadron should continue at 1m daily.  We appreciate the opportunity to participate in the care of Aaron Espinoza   All questions were answered. The patient knows to call the clinic with any problems, questions or concerns. No barriers to learning were detected.  I have spent a total of 40 minutes of face-to-face and non-face-to-face time, excluding clinical staff time, preparing to see patient, ordering tests and/or medications, counseling the patient, and independently interpreting results and communicating results to the patient/family/caregiver   ZVentura Sellers MD Medical Director of Neuro-Oncology [facility]11/22/21 12:15 PM

## 2019-12-28 ENCOUNTER — Telehealth: Payer: Self-pay | Admitting: Internal Medicine

## 2019-12-28 NOTE — Telephone Encounter (Signed)
Scheduled per 11/22 los. Called and spoke with pt, confirmed 12/27 appt

## 2019-12-29 ENCOUNTER — Encounter: Payer: Self-pay | Admitting: Internal Medicine

## 2020-01-03 ENCOUNTER — Encounter (HOSPITAL_COMMUNITY): Payer: Self-pay | Admitting: Internal Medicine

## 2020-01-03 ENCOUNTER — Other Ambulatory Visit: Payer: Self-pay | Admitting: Internal Medicine

## 2020-01-03 MED ORDER — LISINOPRIL 10 MG PO TABS: 10.0000 mg | ORAL_TABLET | Freq: Every day | ORAL | 1 refills | Status: DC

## 2020-01-11 ENCOUNTER — Other Ambulatory Visit: Payer: Self-pay | Admitting: Radiation Therapy

## 2020-01-12 ENCOUNTER — Other Ambulatory Visit: Payer: Self-pay | Admitting: Internal Medicine

## 2020-01-17 ENCOUNTER — Other Ambulatory Visit: Payer: Self-pay | Admitting: Internal Medicine

## 2020-01-17 DIAGNOSIS — C710 Malignant neoplasm of cerebrum, except lobes and ventricles: Secondary | ICD-10-CM

## 2020-01-24 ENCOUNTER — Other Ambulatory Visit: Payer: Self-pay | Admitting: Radiation Therapy

## 2020-01-27 ENCOUNTER — Other Ambulatory Visit: Payer: Self-pay

## 2020-01-27 ENCOUNTER — Ambulatory Visit (HOSPITAL_COMMUNITY)
Admission: RE | Admit: 2020-01-27 | Discharge: 2020-01-27 | Disposition: A | Discharge status: Home / Self Care | Payer: BC Managed Care – PPO | Source: Ambulatory Visit | Attending: Internal Medicine | Admitting: Internal Medicine

## 2020-01-27 ENCOUNTER — Other Ambulatory Visit: Payer: Self-pay | Admitting: Internal Medicine

## 2020-01-27 DIAGNOSIS — C710 Malignant neoplasm of cerebrum, except lobes and ventricles: Secondary | ICD-10-CM | POA: Diagnosis not present

## 2020-01-27 DIAGNOSIS — C719 Malignant neoplasm of brain, unspecified: Secondary | ICD-10-CM | POA: Diagnosis not present

## 2020-01-27 DIAGNOSIS — I618 Other nontraumatic intracerebral hemorrhage: Secondary | ICD-10-CM | POA: Diagnosis not present

## 2020-01-27 DIAGNOSIS — G911 Obstructive hydrocephalus: Secondary | ICD-10-CM | POA: Diagnosis not present

## 2020-01-27 DIAGNOSIS — G9389 Other specified disorders of brain: Secondary | ICD-10-CM | POA: Diagnosis not present

## 2020-01-27 IMAGING — MR MR HEAD WO/W CM
11 series · 48 of 48 positions shown · IV contrast (gadavist)
Comparison: MRI [DATE].

CLINICAL DATA: Brain/CNS neoplasm, assess treatment response.
Anaplastic astrocytoma.

EXAM:
MRI HEAD WITHOUT AND WITH CONTRAST
TECHNIQUE: Multiplanar, multiecho pulse sequences of the brain and surrounding
structures were obtained without and with intravenous contrast.
CONTRAST:  9mL GADAVIST GADOBUTROL 1 MMOL/ML IV SOLN

[Series 6: DWI · axial · 3.0mm · 1.36mm/px · z∈[-43,+95]mm · 3 of 48 slices shown (1 of 2)]
[im 1/48]
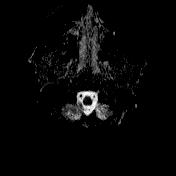
[im 24/48]
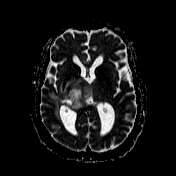
[im 48/48]
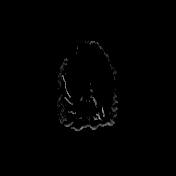

[Series 7: T1 · sagittal · 5.0mm · 0.75mm/px · 2 of 24 slices shown (1 of 2)]
[im 1/24]
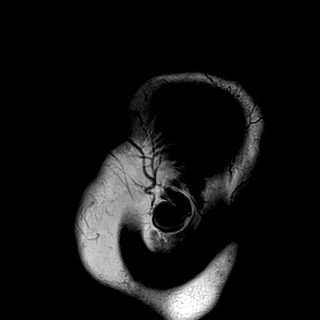
[im 24/24]
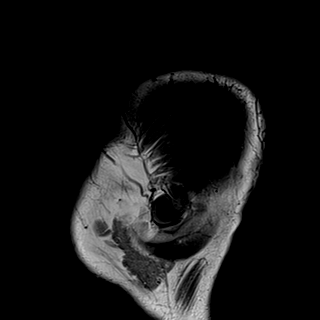

[Series 9: mip_images(sw) · axial · 24.0mm · 0.75mm/px · z∈[-50,+102]mm · 4 of 53 slices shown]
[im 1/53]
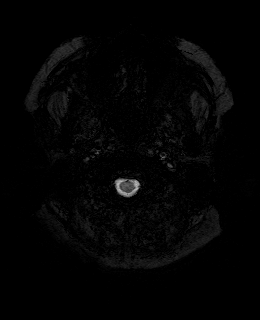
[im 18/53]
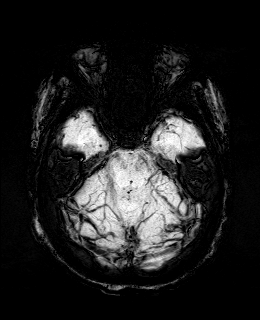
[im 35/53]
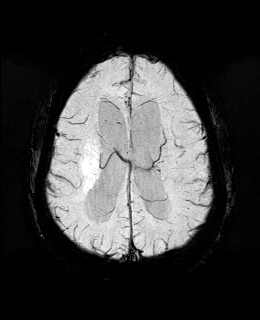
[im 53/53]
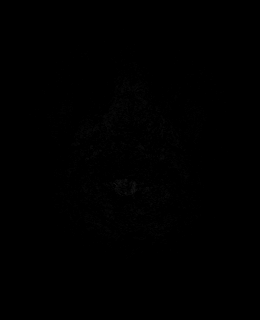

[Series 10: swi_images · axial · 3.0mm · 0.75mm/px · z∈[-60,+112]mm · 4 of 60 slices shown]
[im 1/60]
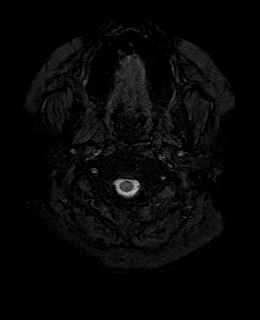
[im 20/60]
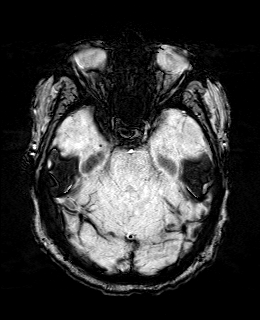
[im 40/60]
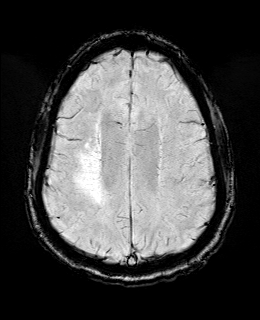
[im 60/60]
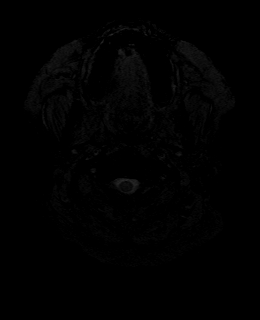

[Series 11: FLAIR · axial · 3.0mm · 0.75mm/px · z∈[-49,+101]mm · 4 of 52 slices shown]
[im 1/52]
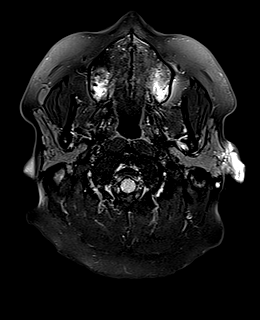
[im 18/52]
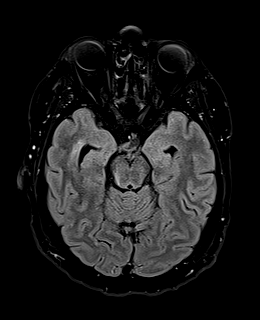
[im 35/52]
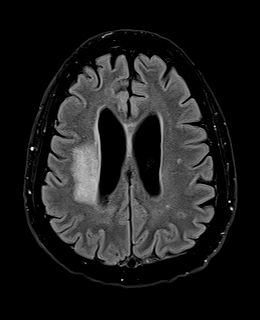
[im 52/52]
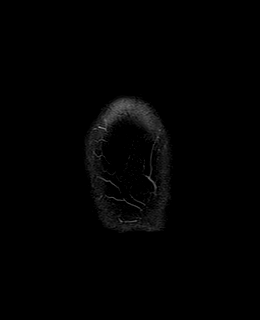

[Series 12: T1 · axial · 1.0mm · 0.94mm/px · z∈[-44,+96]mm · 10 of 144 slices shown (2 of 2)]
[im 1/144]
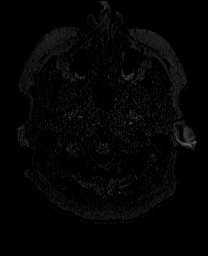
[im 16/144]
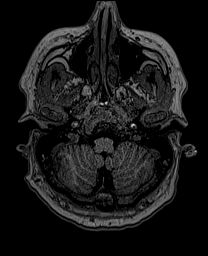
[im 32/144]
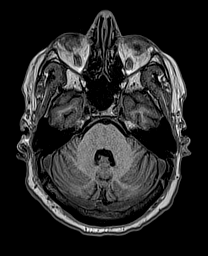
[im 48/144]
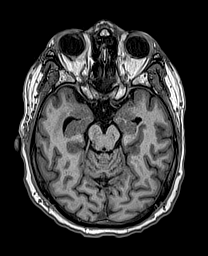
[im 64/144]
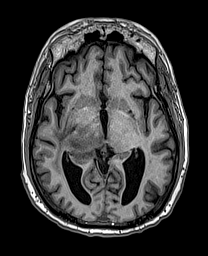
[im 80/144]
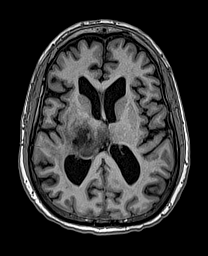
[im 96/144]
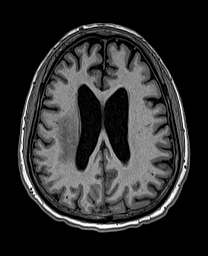
[im 112/144]
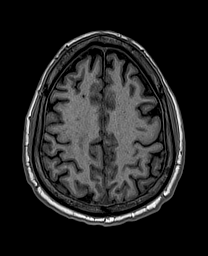
[im 128/144]
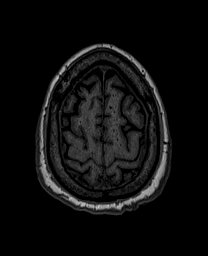
[im 144/144]
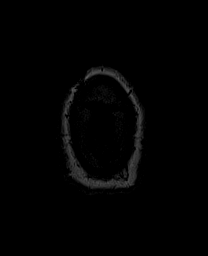

[Series 13: DWI · coronal · 5.0mm · 1.31mm/px · 5 of 72 slices shown (2 of 2)]
[im 1/72]
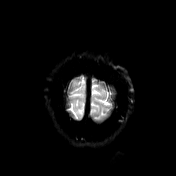
[im 18/72]
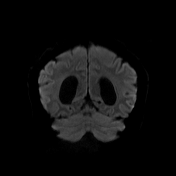
[im 36/72]
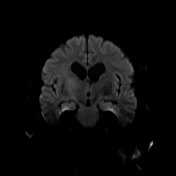
[im 54/72]
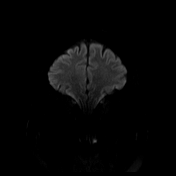
[im 72/72]
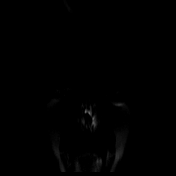

[Series 15: T2 post-contrast · coronal · 5.0mm · 0.57mm/px · 2 of 30 slices shown]
[im 1/30]
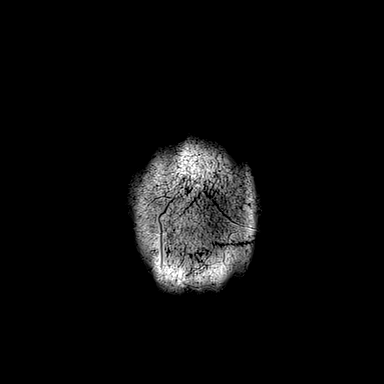
[im 30/30]
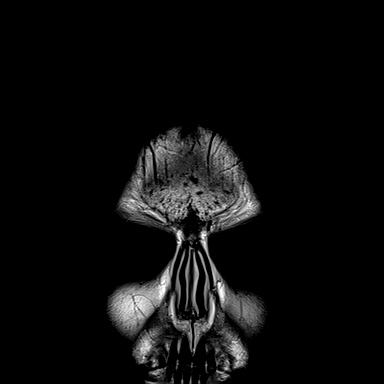

[Series 16: T1 post-contrast · axial · 1.0mm · 0.94mm/px · z∈[-44,+96]mm · 10 of 144 slices shown (1 of 3)]
[im 1/144]
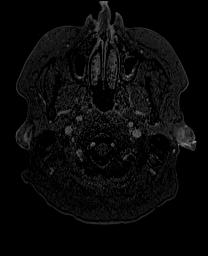
[im 16/144]
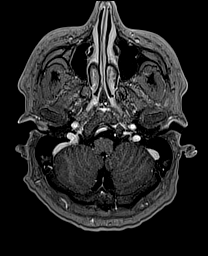
[im 32/144]
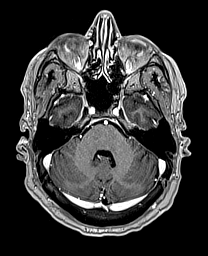
[im 48/144]
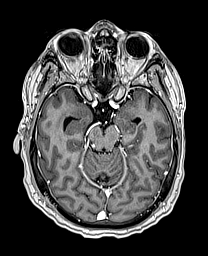
[im 64/144]
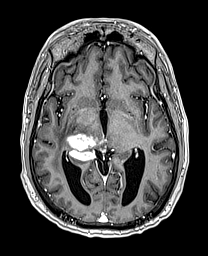
[im 80/144]
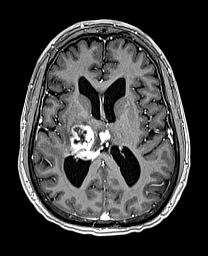
[im 96/144]
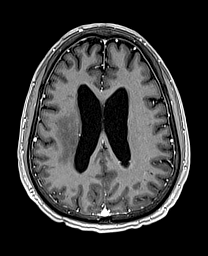
[im 112/144]
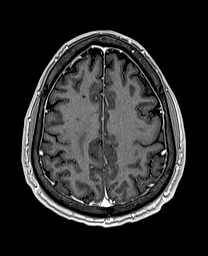
[im 128/144]
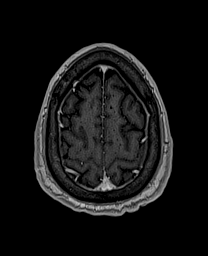
[im 144/144]
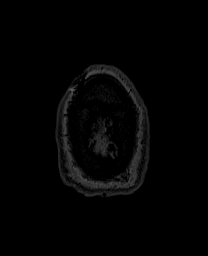

[Series 17: T1 post-contrast · coronal · 5.0mm · 0.43mm/px · 2 of 30 slices shown (2 of 3)]
[im 1/30]
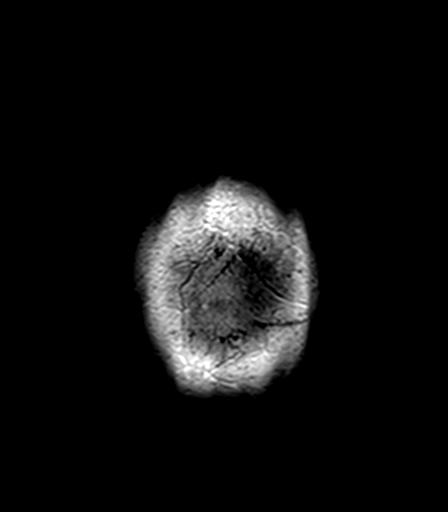
[im 30/30]
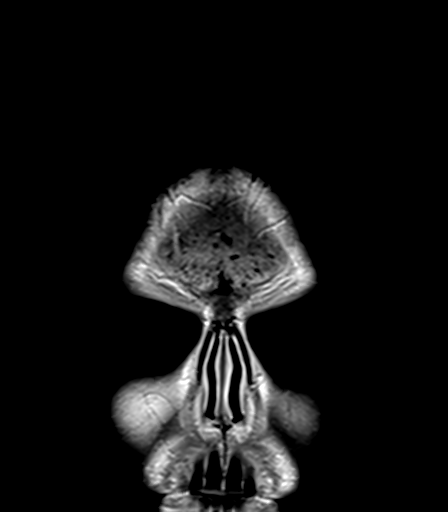

[Series 18: T1 post-contrast · sagittal · 5.0mm · 0.75mm/px · 2 of 24 slices shown (3 of 3)]
[im 1/24]
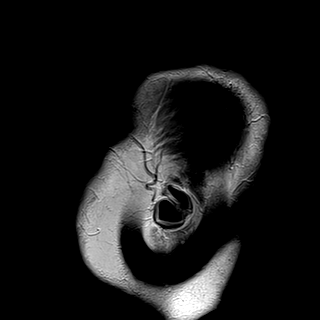
[im 24/24]
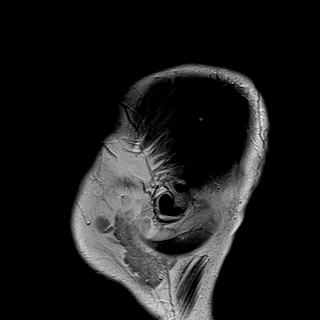

[48 of 48 positions shown; findings below may reference images not displayed]

FINDINGS: Brain: No change in an enhancing mass centered in the right
thalamus. Similar central necrosis with punctate areas of internal
hemorrhage. Similar invasion of the adjacent middle cerebral
peduncle and right midbrain. Similar linear enhancement involving
the fourth ventricle and floor of the third ventricle. Similar
surrounding T2/FLAIR hyperintensity. Similar mass effect with mild
leftward midline shift. No new areas of enhancement identified.
Similar mild ventricular and enlargement, likely related to
obstruction at the level of the cerebral aqueduct. No evidence of
acute infarct.

Vascular: Major arterial flow voids are maintained at the skull
base.

Skull and upper cervical spine: Normal marrow signal.

Sinuses/Orbits: Sinuses are clear.  Unremarkable orbits.

Other: No mastoid effusions.
IMPRESSION: No change in the enhancing mass centered in the right thalamus with
similar extension into the right cerebral peduncle and midbrain.
Similar enhancement involving the aqua duct and inferior third
ventricle, likely representing transependymal spread of tumor.
Similar mild obstructive hydrocephalus.

## 2020-01-27 MED ORDER — GADOBUTROL 1 MMOL/ML IV SOLN
9.0000 mL | Freq: Once | INTRAVENOUS | Status: AC | PRN
  Administered 2020-01-27: 9 mL via INTRAVENOUS

## 2020-01-31 ENCOUNTER — Inpatient Hospital Stay: Payer: BC Managed Care – PPO

## 2020-01-31 ENCOUNTER — Inpatient Hospital Stay: Payer: BC Managed Care – PPO | Attending: Internal Medicine | Admitting: Internal Medicine

## 2020-01-31 ENCOUNTER — Other Ambulatory Visit: Payer: Self-pay

## 2020-01-31 VITALS — BP 162/84 | HR 58 | Temp 96.6°F | Resp 18 | Ht 72.0 in | Wt 205.1 lb

## 2020-01-31 DIAGNOSIS — Z9221 Personal history of antineoplastic chemotherapy: Secondary | ICD-10-CM | POA: Insufficient documentation

## 2020-01-31 DIAGNOSIS — Z79899 Other long term (current) drug therapy: Secondary | ICD-10-CM | POA: Insufficient documentation

## 2020-01-31 DIAGNOSIS — Z7952 Long term (current) use of systemic steroids: Secondary | ICD-10-CM | POA: Insufficient documentation

## 2020-01-31 DIAGNOSIS — G911 Obstructive hydrocephalus: Secondary | ICD-10-CM | POA: Insufficient documentation

## 2020-01-31 DIAGNOSIS — R531 Weakness: Secondary | ICD-10-CM | POA: Diagnosis not present

## 2020-01-31 DIAGNOSIS — N400 Enlarged prostate without lower urinary tract symptoms: Secondary | ICD-10-CM | POA: Diagnosis not present

## 2020-01-31 DIAGNOSIS — C710 Malignant neoplasm of cerebrum, except lobes and ventricles: Secondary | ICD-10-CM | POA: Diagnosis not present

## 2020-01-31 DIAGNOSIS — Q8500 Neurofibromatosis, unspecified: Secondary | ICD-10-CM | POA: Diagnosis not present

## 2020-01-31 DIAGNOSIS — Z923 Personal history of irradiation: Secondary | ICD-10-CM | POA: Diagnosis not present

## 2020-01-31 DIAGNOSIS — Z87891 Personal history of nicotine dependence: Secondary | ICD-10-CM | POA: Insufficient documentation

## 2020-01-31 DIAGNOSIS — R5383 Other fatigue: Secondary | ICD-10-CM | POA: Diagnosis not present

## 2020-01-31 LAB — CBC WITH DIFFERENTIAL (CANCER CENTER ONLY)
Abs Immature Granulocytes: 0.25 10*3/uL — ABNORMAL HIGH (ref 0.00–0.07)
Basophils Absolute: 0 10*3/uL (ref 0.0–0.1)
Basophils Relative: 1 %
Eosinophils Absolute: 0 10*3/uL (ref 0.0–0.5)
Eosinophils Relative: 1 %
HCT: 44.6 % (ref 39.0–52.0)
Hemoglobin: 15.6 g/dL (ref 13.0–17.0)
Immature Granulocytes: 3 %
Lymphocytes Relative: 17 %
Lymphs Abs: 1.4 10*3/uL (ref 0.7–4.0)
MCH: 31.5 pg (ref 26.0–34.0)
MCHC: 35 g/dL (ref 30.0–36.0)
MCV: 89.9 fL (ref 80.0–100.0)
Monocytes Absolute: 0.8 10*3/uL (ref 0.1–1.0)
Monocytes Relative: 10 %
Neutro Abs: 5.7 10*3/uL (ref 1.7–7.7)
Neutrophils Relative %: 68 %
Platelet Count: 221 10*3/uL (ref 150–400)
RBC: 4.96 MIL/uL (ref 4.22–5.81)
RDW: 12.4 % (ref 11.5–15.5)
WBC Count: 8.2 10*3/uL (ref 4.0–10.5)
nRBC: 0 % (ref 0.0–0.2)

## 2020-01-31 LAB — CMP (CANCER CENTER ONLY)
ALT: 22 U/L (ref 0–44)
AST: 12 U/L — ABNORMAL LOW (ref 15–41)
Albumin: 3.8 g/dL (ref 3.5–5.0)
Alkaline Phosphatase: 45 U/L (ref 38–126)
Anion gap: 6 (ref 5–15)
BUN: 15 mg/dL (ref 6–20)
CO2: 28 mmol/L (ref 22–32)
Calcium: 9.7 mg/dL (ref 8.9–10.3)
Chloride: 99 mmol/L (ref 98–111)
Creatinine: 0.82 mg/dL (ref 0.61–1.24)
GFR, Estimated: >60 mL/min (ref >60)
Glucose, Bld: 90 mg/dL (ref 70–99)
Potassium: 3.8 mmol/L (ref 3.5–5.1)
Sodium: 133 mmol/L — ABNORMAL LOW (ref 135–145)
Total Bilirubin: 0.7 mg/dL (ref 0.3–1.2)
Total Protein: 6.9 g/dL (ref 6.5–8.1)

## 2020-01-31 NOTE — Progress Notes (Signed)
[facility] [place], [place] [address] [phone]   Interval Evaluation  Date of Service: 01/31/20 Patient Name: Aaron Espinoza Patient MRN: [id number] Patient DOB: 17-Jan-1976 Provider: Ventura Sellers, MD  Identifying Statement:  Aaron Espinoza is a 44 y.o. male with right thalamic anaplastic astrocytoma   Oncologic History: Oncology History  Anaplastic astrocytoma of thalamus ([place])  03/17/2019 Surgery   Stereotactic biopsy by Dr. Marcello Moores; path demonstrates anaplastic astrocytoma   04/12/2019 - 05/21/2019 Radiation Therapy   IMRT with concurrent Temodar 21m/m2 daily   06/14/2019 -  Chemotherapy   The patient had dexamethasone (DECADRON) 4 MG tablet, 1 of 1 cycle, Start date: --, End date: -- temozolomide (TEMODAR) 5 MG capsule, 200 mg/m2/day = 410 mg, Oral, Daily, 1 of 1 cycle, Start date: --, End date: -- temozolomide (TEMODAR) 20 MG capsule, 20 mg, , , 1 of 1 cycle, Start date: 08/30/2019, End date: -- Dose modification: 20 mg (original dose 20 mg, Cycle 1) temozolomide (TEMODAR) 100 MG capsule, 400 mg, Oral, Daily, 1 of 1 cycle, Start date: 12/27/2019, End date: 01/01/2020 Dose modification: 100 mg (original dose 100 mg, Cycle 1), 400 mg (original dose 100 mg, Cycle 1) temozolomide (TEMODAR) 140 MG capsule, 280 mg (100 % of original dose 280 mg), , , 1 of 1 cycle, Start date: 08/30/2019, End date: -- Dose modification: 280 mg (original dose 280 mg, Cycle 1)  for chemotherapy treatment.      Biomarkers:  MGMT Unknown.  IDH 1/2 Wild type.  EGFR Unknown  TERT Unknown   Interval History:  JKRISHAN MCBREENpresents to clinic for follow up now having completed cycle #8 of 5-day TMZ.  He continues to describe further increase in left sided weakness, including worsened dragging of left leg while walking.  Left arm seems to be weaker as well.  He is now relying full time on a four pointed walker, no longer able to use the  cane for ambulation. Fatigue is otherwise stable from prior. No other issues tolerating chemotherapy.  Decadron remains at 434mdaily.  H+P (03/30/19) Patient presented to medical attention in late January, when he noticed mild confusion, gait imbalance.  These symptoms worsened over the next ~10 days, and were accompanied by incontinence.  Brain MRI was performed and demonstrated a right thalamic mass consistent with likely primary brain tumor.  He underwent stereotactic biopsy and ventriculostomy on 03/17/19 with Dr. ThMarcello Moores Following surgery he describes considerable improvement in most of his symptoms; at this time he is near baseline function but "very fatigued" overall.  No seizures or headaches since surgery.  Medications: Current Outpatient Medications on File Prior to Visit  Medication Sig Dispense Refill  . acetaminophen (TYLENOL) 500 MG tablet Take 1,000 mg by mouth every 6 (six) hours as needed.    . Marland Kitchenexamethasone (DECADRON) 2 MG tablet TAKE 2 TABLETS (4 MG TOTAL) BY MOUTH DAILY. 60 tablet 1  . docusate sodium (COLACE) 100 MG capsule Take 100 mg by mouth daily.    . Marland Kitchenbuprofen (ADVIL) 200 MG tablet Take 800 mg by mouth every 6 (six) hours as needed.    . Marland Kitchenisinopril (PRINIVIL) 10 MG tablet Take 1 tablet (10 mg total) by mouth daily. 30 tablet 1  . NON FORMULARY Take 2 capsules by mouth daily. [employer] CORDYCEPS for Energy Support    . NON FORMULARY Take 2 capsules by mouth daily. [employer] BRAIN for Mental Clarity    .  NON FORMULARY Take 2 capsules by mouth daily. [employer] STAMETS 7 for Daily Immune Support    . NON FORMULARY Take 2 capsules by mouth daily. [employer] [place] TAIL for Immune Support    . ondansetron (ZOFRAN) 8 MG tablet Take 1 tablet (8 mg total) by mouth 2 (two) times daily as needed (nausea and vomiting). May take 30-60 minutes prior to Temodar administration if nausea/vomiting occurs. (Patient not taking: Reported on 09/20/2019) 30 tablet 1  . pantoprazole  (PROTONIX) 40 MG tablet TAKE 1 TABLET BY MOUTH EVERY DAY 30 tablet 3  . polyethylene glycol (MIRALAX / GLYCOLAX) 17 g packet Take 1 packet by mouth daily.    . STOOL SOFTENER 100 MG capsule TAKE 1 TABLET BY MOUTH 2 TIMES EVERY DAY AT BEDTIME AS NEEDED 60 tablet 2  . temozolomide (TEMODAR) 140 MG capsule TAKE 2 CAPSULES BY MOUTH DAILY FOR 5 DAYS. TAKE ON DAYS 1-5 OF CYCLE 1 OF TREATMENT. MAY TAKE ON AN EMPTY STOMACH TO DECREASE NAUSEA & VING. (Patient not taking: Reported on 09/20/2019) 10 capsule 0  . temozolomide (TEMODAR) 20 MG capsule TAKE 1 CAPSULE BY MOUTH DAILY FOR 5 DAYS. TAKE ON DAYS 1-5 OF CYCLE 1 OF TREATMENT. MAY TAKE ON AN EMPTY STOMACH TO DECREASE NAUSEA & VONG. (Patient not taking: Reported on 09/20/2019) 5 capsule 0   No current facility-administered medications on file prior to visit.    Allergies:  Allergies  Allergen Reactions  . Gadolinium Derivatives Itching   Past Medical History:  Past Medical History:  Diagnosis Date  . BPH (benign prostatic hyperplasia) 07/20/2015  . Brain mass   . Cognitive dysfunction 01/2019  . Fatigue 01/2019  . Headache   . NF (neurofibromatosis) ([facility]) 07/20/2015   NF1   Past Surgical History:  Past Surgical History:  Procedure Laterality Date  . HAND SURGERY Left    pinky - tumor benign  . LEG SURGERY Left    inner thigh tumor - benign  . VENTRICULOSTOMY N/A 03/17/2019   Procedure: ENDOSCOPIC THIRD VENTRICULOSTOMY;  Surgeon: Vallarie Mare, MD;  Location: [facility];  Service: Neurosurgery;  Laterality: N/A;  ENDOSCOPIC THIRD VENTRICULOSTOMY   Social History:  Social History   Socioeconomic History  . Marital status: Married    Spouse name: Not on file  . Number of children: Not on file  . Years of education: Not on file  . Highest education level: Not on file  Occupational History  . Not on file  Tobacco Use  . Smoking status: Former Smoker    Types: Cigars, Cigarettes  . Smokeless tobacco: Never Used  . Tobacco comment:  Quit smoking cigarettes 25 yrs ago, quit cigars in 2020  Vaping Use  . Vaping Use: Never used  Substance and Sexual Activity  . Alcohol use: Yes    Alcohol/week: 6.0 - 9.0 standard drinks    Types: 6 - 9 Standard drinks or equivalent per week  . Drug use: No  . Sexual activity: Not on file  Other Topics Concern  . Not on file  Social History Narrative  . Not on file   Social Determinants of Health   Financial Resource Strain: Not on file  Food Insecurity: Not on file  Transportation Needs: Not on file  Physical Activity: Not on file  Stress: Not on file  Social Connections: Not on file  Intimate Partner Violence: Not on file   Family History:  Family History  Problem Relation Age of Onset  . Cancer Paternal  Grandfather        Prostate    Review of Systems: Constitutional: Doesn't report fevers, chills or abnormal weight loss Eyes: Doesn't report blurriness of vision Ears, nose, mouth, throat, and face: Doesn't report sore throat Respiratory: Doesn't report cough, dyspnea or wheezes Cardiovascular: Doesn't report palpitation, chest discomfort  Gastrointestinal:  Doesn't report nausea, constipation, diarrhea GU: Doesn't report incontinence Skin: Doesn't report skin rashes Neurological: Per HPI Musculoskeletal: Doesn't report joint pain Behavioral/Psych: Doesn't report anxiety  Physical Exam: Vitals:   01/31/20 1210  BP: (!) 162/84  Pulse: (!) 58  Resp: 18  Temp: (!) 96.6 F (35.9 C)  SpO2: 100%   KPS: 70. General: ++fatigue Head: Normal EENT: No conjunctival injection or scleral icterus.  Lungs: Resp effort normal Cardiac: Regular rate Abdomen: Non-distended abdomen Skin: No rashes cyanosis or petechiae. Extremities: No clubbing or edema  Neurologic Exam: Mental Status: Awake, alert, attentive to examiner. Oriented to self and environment. Language is fluent with intact comprehension.  Cranial Nerves: Visual acuity is grossly normal. L visual field  impairment. Extra-ocular movements intact. No ptosis. Face is symmetric Motor: Tone and bulk are normal. Power is 3/5 in left arm and 3/5 in left leg. Reflexes are symmetric, no pathologic reflexes present.  Sensory: Intact to light touch Gait: Hemiparetic, not independent  Labs: I have reviewed the data as listed    Component Value Date/Time   NA 133 (L) 01/31/2020 1146   K 3.8 01/31/2020 1146   CL 99 01/31/2020 1146   CO2 28 01/31/2020 1146   GLUCOSE 90 01/31/2020 1146   BUN 15 01/31/2020 1146   CREATININE 0.82 01/31/2020 1146   CREATININE 0.83 03/04/2019 1206   CALCIUM 9.7 01/31/2020 1146   PROT 6.9 01/31/2020 1146   ALBUMIN 3.8 01/31/2020 1146   AST 12 (L) 01/31/2020 1146   ALT 22 01/31/2020 1146   ALKPHOS 45 01/31/2020 1146   BILITOT 0.7 01/31/2020 1146   GFRNONAA >60 01/31/2020 1146   GFRNONAA 106 03/04/2019 1206   GFRAA >60 10/25/2019 1209   GFRAA 123 03/04/2019 1206   Lab Results  Component Value Date   WBC 8.2 01/31/2020   NEUTROABS 5.7 01/31/2020   HGB 15.6 01/31/2020   HCT 44.6 01/31/2020   MCV 89.9 01/31/2020   PLT 221 01/31/2020   Imaging:  [place] Clinician Interpretation: I have personally reviewed the CNS images as listed.  My interpretation, in the context of the patient's clinical presentation, is stable disease  MR BRAIN W WO CONTRAST  Result Date: 01/27/2020 CLINICAL DATA:  Brain/CNS neoplasm, assess treatment response. Anaplastic astrocytoma. EXAM: MRI HEAD WITHOUT AND WITH CONTRAST TECHNIQUE: Multiplanar, multiecho pulse sequences of the brain and surrounding structures were obtained without and with intravenous contrast. CONTRAST:  44m GADAVIST GADOBUTROL 1 MMOL/ML IV SOLN COMPARISON:  MRI December 22, 2019. FINDINGS: Brain: No change in an enhancing mass centered in the right thalamus. Similar central necrosis with punctate areas of internal hemorrhage. Similar invasion of the adjacent middle cerebral peduncle and right midbrain. Similar linear  enhancement involving the fourth ventricle and floor of the third ventricle. Similar surrounding T2/FLAIR hyperintensity. Similar mass effect with mild leftward midline shift. No new areas of enhancement identified. Similar mild ventricular and enlargement, likely related to obstruction at the level of the cerebral aqueduct. No evidence of acute infarct. Vascular: Major arterial flow voids are maintained at the skull base. Skull and upper cervical spine: Normal marrow signal. Sinuses/Orbits: Sinuses are clear.  Unremarkable orbits. Other: No mastoid effusions. IMPRESSION:  No change in the enhancing mass centered in the right thalamus with similar extension into the right cerebral peduncle and midbrain. Similar enhancement involving the aqua duct and inferior third ventricle, likely representing transependymal spread of tumor. Similar mild obstructive hydrocephalus. Electronically Signed   By: Margaretha Sheffield MD   On: 01/27/2020 16:39    Assessment/Plan Anaplastic astrocytoma of thalamus ([facility]) [C71.0]  Mr. Reier presents today having completed cycle #8 5-day Temozolomide.  Clinically, left sided weakness continues to progress.  MRI brain again demonstrates stable disease, including with regards to T2 signal abnormality.  We suspect clinical decline may be related to underlying progressive radiation injury, obscured by extent of tumor on imaging.  There is no progression of tumor that would explain the clinical findings.   We then recommended continuing with cycle #9 Temozolomide 200 mg/m2, on for five days and off for twenty three days in twenty eight day cycles. The patient will have a complete blood count performed on days 21 and 28 of each cycle, and a comprehensive metabolic panel performed on day 28 of each cycle. Labs may need to be performed more often. Zofran will prescribed for home use for nausea/vomiting.   Chemotherapy should be held for the following:  ANC less than 1,000  Platelets  less than 100,000  LFT or creatinine greater than 2x ULN  If clinical concerns/contraindications develop  Will decrease decadron to 32m daily.  We appreciate the opportunity to participate in the care of JKYCEN SPALLA  He should return to clinic in 1 month with labs for review prior to cycle #10.  All questions were answered. The patient knows to call the clinic with any problems, questions or concerns. No barriers to learning were detected.  I have spent a total of 40 minutes of face-to-face and non-face-to-face time, excluding clinical staff time, preparing to see patient, ordering tests and/or medications, counseling the patient, and independently interpreting results and communicating results to the patient/family/caregiver   ZVentura Sellers MD Medical Director of Neuro-Oncology [facility]12/27/21 12:28 PM

## 2020-02-01 ENCOUNTER — Telehealth: Payer: Self-pay | Admitting: Internal Medicine

## 2020-02-01 ENCOUNTER — Encounter: Payer: Self-pay | Admitting: Internal Medicine

## 2020-02-01 NOTE — Telephone Encounter (Signed)
Scheduled follow-up appointment per 12/27 los. Patient is aware.

## 2020-02-02 ENCOUNTER — Other Ambulatory Visit: Payer: Self-pay | Admitting: Internal Medicine

## 2020-02-02 DIAGNOSIS — C710 Malignant neoplasm of cerebrum, except lobes and ventricles: Secondary | ICD-10-CM

## 2020-02-02 MED ORDER — TEMOZOLOMIDE 100 MG PO CAPS: 400.0000 mg | ORAL_CAPSULE | Freq: Every day | ORAL | 0 refills | Status: DC

## 2020-02-14 ENCOUNTER — Inpatient Hospital Stay: Payer: BC Managed Care – PPO

## 2020-02-25 ENCOUNTER — Other Ambulatory Visit: Payer: Self-pay | Admitting: Internal Medicine

## 2020-02-25 DIAGNOSIS — C710 Malignant neoplasm of cerebrum, except lobes and ventricles: Secondary | ICD-10-CM

## 2020-02-26 ENCOUNTER — Other Ambulatory Visit: Payer: Self-pay | Admitting: Internal Medicine

## 2020-03-03 NOTE — Telephone Encounter (Signed)
Refill request

## 2020-03-06 ENCOUNTER — Other Ambulatory Visit: Payer: Self-pay

## 2020-03-06 ENCOUNTER — Inpatient Hospital Stay: Payer: BC Managed Care – PPO | Attending: Internal Medicine | Admitting: Internal Medicine

## 2020-03-06 ENCOUNTER — Inpatient Hospital Stay: Payer: BC Managed Care – PPO

## 2020-03-06 VITALS — BP 149/88 | HR 60 | Temp 98.1°F | Resp 18 | Ht 72.0 in | Wt 200.3 lb

## 2020-03-06 DIAGNOSIS — R112 Nausea with vomiting, unspecified: Secondary | ICD-10-CM | POA: Insufficient documentation

## 2020-03-06 DIAGNOSIS — R5383 Other fatigue: Secondary | ICD-10-CM | POA: Diagnosis not present

## 2020-03-06 DIAGNOSIS — Q8500 Neurofibromatosis, unspecified: Secondary | ICD-10-CM | POA: Insufficient documentation

## 2020-03-06 DIAGNOSIS — N400 Enlarged prostate without lower urinary tract symptoms: Secondary | ICD-10-CM | POA: Diagnosis not present

## 2020-03-06 DIAGNOSIS — Z87891 Personal history of nicotine dependence: Secondary | ICD-10-CM | POA: Diagnosis not present

## 2020-03-06 DIAGNOSIS — Z7952 Long term (current) use of systemic steroids: Secondary | ICD-10-CM | POA: Diagnosis not present

## 2020-03-06 DIAGNOSIS — Z9221 Personal history of antineoplastic chemotherapy: Secondary | ICD-10-CM | POA: Insufficient documentation

## 2020-03-06 DIAGNOSIS — R531 Weakness: Secondary | ICD-10-CM | POA: Diagnosis not present

## 2020-03-06 DIAGNOSIS — C710 Malignant neoplasm of cerebrum, except lobes and ventricles: Secondary | ICD-10-CM | POA: Insufficient documentation

## 2020-03-06 DIAGNOSIS — Z923 Personal history of irradiation: Secondary | ICD-10-CM | POA: Insufficient documentation

## 2020-03-06 LAB — CMP (CANCER CENTER ONLY)
ALT: 18 U/L (ref 0–44)
AST: 11 U/L — ABNORMAL LOW (ref 15–41)
Albumin: 4 g/dL (ref 3.5–5.0)
Alkaline Phosphatase: 48 U/L (ref 38–126)
Anion gap: 6 (ref 5–15)
BUN: 23 mg/dL — ABNORMAL HIGH (ref 6–20)
CO2: 28 mmol/L (ref 22–32)
Calcium: 9.6 mg/dL (ref 8.9–10.3)
Chloride: 105 mmol/L (ref 98–111)
Creatinine: 0.89 mg/dL (ref 0.61–1.24)
GFR, Estimated: >60 mL/min (ref >60)
Glucose, Bld: 83 mg/dL (ref 70–99)
Potassium: 3.9 mmol/L (ref 3.5–5.1)
Sodium: 139 mmol/L (ref 135–145)
Total Bilirubin: 0.8 mg/dL (ref 0.3–1.2)
Total Protein: 7 g/dL (ref 6.5–8.1)

## 2020-03-06 LAB — CBC WITH DIFFERENTIAL (CANCER CENTER ONLY)
Abs Immature Granulocytes: 0.16 10*3/uL — ABNORMAL HIGH (ref 0.00–0.07)
Basophils Absolute: 0.1 10*3/uL (ref 0.0–0.1)
Basophils Relative: 1 %
Eosinophils Absolute: 0.1 10*3/uL (ref 0.0–0.5)
Eosinophils Relative: 1 %
HCT: 43.9 % (ref 39.0–52.0)
Hemoglobin: 15.6 g/dL (ref 13.0–17.0)
Immature Granulocytes: 3 %
Lymphocytes Relative: 21 %
Lymphs Abs: 1.2 10*3/uL (ref 0.7–4.0)
MCH: 31.5 pg (ref 26.0–34.0)
MCHC: 35.5 g/dL (ref 30.0–36.0)
MCV: 88.5 fL (ref 80.0–100.0)
Monocytes Absolute: 0.7 10*3/uL (ref 0.1–1.0)
Monocytes Relative: 12 %
Neutro Abs: 3.6 10*3/uL (ref 1.7–7.7)
Neutrophils Relative %: 62 %
Platelet Count: 262 10*3/uL (ref 150–400)
RBC: 4.96 MIL/uL (ref 4.22–5.81)
RDW: 12.5 % (ref 11.5–15.5)
WBC Count: 5.8 10*3/uL (ref 4.0–10.5)
nRBC: 0 % (ref 0.0–0.2)

## 2020-03-06 MED ORDER — DEXAMETHASONE 2 MG PO TABS: 2.0000 mg | ORAL_TABLET | Freq: Every day | ORAL | 1 refills | Status: DC

## 2020-03-06 MED ORDER — TEMOZOLOMIDE 100 MG PO CAPS: 200.0000 mg/m2/d | ORAL_CAPSULE | Freq: Every day | ORAL | 0 refills | Status: DC

## 2020-03-06 NOTE — Progress Notes (Signed)
[facility] [place], [place] [address] [phone]   Interval Evaluation  Date of Service: 03/06/20 Patient Name: Aaron Espinoza Patient MRN: [id number] Patient DOB: 01/02/1976 Provider: Ventura Sellers, MD  Identifying Statement:  EMILE KYLLO is a 45 y.o. male with right thalamic anaplastic astrocytoma   Oncologic History: Oncology History  Anaplastic astrocytoma of thalamus ([place])  03/17/2019 Surgery   Stereotactic biopsy by Dr. Marcello Moores; path demonstrates anaplastic astrocytoma   04/12/2019 - 05/21/2019 Radiation Therapy   IMRT with concurrent Temodar 76m/m2 daily   06/14/2019 -  Chemotherapy   The patient had dexamethasone (DECADRON) 4 MG tablet, 1 of 1 cycle, Start date: --, End date: -- temozolomide (TEMODAR) 5 MG capsule, 200 mg/m2/day = 410 mg, Oral, Daily, 1 of 1 cycle, Start date: --, End date: -- temozolomide (TEMODAR) 20 MG capsule, 20 mg, , , 1 of 1 cycle, Start date: 08/30/2019, End date: -- Dose modification: 20 mg (original dose 20 mg, Cycle 1) temozolomide (TEMODAR) 100 MG capsule, 400 mg, Oral, Daily, 1 of 1 cycle, Start date: 02/02/2020, End date: 02/07/2020 Dose modification: 100 mg (original dose 100 mg, Cycle 1), 400 mg (original dose 100 mg, Cycle 1) temozolomide (TEMODAR) 140 MG capsule, 280 mg (100 % of original dose 280 mg), , , 1 of 1 cycle, Start date: 08/30/2019, End date: -- Dose modification: 280 mg (original dose 280 mg, Cycle 1)  for chemotherapy treatment.      Biomarkers:  MGMT Unknown.  IDH 1/2 Wild type.  EGFR Unknown  TERT Unknown   Interval History:  Aaron PINEDOpresents to clinic for follow up now having completed cycle #9 of 5-day TMZ.  No further decline in left sided weakness since last month's post-MRI visit.  Left arm and hand continue to be weak and clumsy, now a little bit contracted.  He is now relying full time on a four pointed walker, no longer able to use the cane  for ambulation. Fatigue is otherwise stable from prior. No other issues tolerating chemotherapy.  Dosing 377mdaily decadron.  H+P (03/30/19) Patient presented to medical attention in late January, when he noticed mild confusion, gait imbalance.  These symptoms worsened over the next ~10 days, and were accompanied by incontinence.  Brain MRI was performed and demonstrated a right thalamic mass consistent with likely primary brain tumor.  He underwent stereotactic biopsy and ventriculostomy on 03/17/19 with Dr. ThMarcello Moores Following surgery he describes considerable improvement in most of his symptoms; at this time he is near baseline function but "very fatigued" overall.  No seizures or headaches since surgery.  Medications: Current Outpatient Medications on File Prior to Visit  Medication Sig Dispense Refill  . acetaminophen (TYLENOL) 500 MG tablet Take 1,000 mg by mouth every 6 (six) hours as needed.    . Marland Kitchenexamethasone (DECADRON) 2 MG tablet TAKE 2 TABLETS (4 MG TOTAL) BY MOUTH DAILY. 60 tablet 1  . docusate sodium (COLACE) 100 MG capsule Take 100 mg by mouth daily.    . Marland Kitchenbuprofen (ADVIL) 200 MG tablet Take 800 mg by mouth every 6 (six) hours as needed.    . Marland Kitchenisinopril (ZESTRIL) 10 MG tablet TAKE 1 TABLET BY MOUTH EVERY DAY 30 tablet 1  . NON FORMULARY Take 2 capsules by mouth daily. [employer] CORDYCEPS for Energy Support    . NON FORMULARY Take 2 capsules by mouth daily. [employer] BRAIN for Mental Clarity    .  NON FORMULARY Take 2 capsules by mouth daily. [employer] STAMETS 7 for Daily Immune Support    . NON FORMULARY Take 2 capsules by mouth daily. [employer] [place] TAIL for Immune Support    . ondansetron (ZOFRAN) 8 MG tablet Take 1 tablet (8 mg total) by mouth 2 (two) times daily as needed (nausea and vomiting). May take 30-60 minutes prior to Temodar administration if nausea/vomiting occurs. (Patient not taking: Reported on 09/20/2019) 30 tablet 1  . pantoprazole (PROTONIX) 40 MG tablet  TAKE 1 TABLET BY MOUTH EVERY DAY 30 tablet 3  . polyethylene glycol (MIRALAX / GLYCOLAX) 17 g packet Take 1 packet by mouth daily.    . STOOL SOFTENER 100 MG capsule TAKE 1 TABLET BY MOUTH 2 TIMES EVERY DAY AT BEDTIME AS NEEDED 60 tablet 2  . temozolomide (TEMODAR) 100 MG capsule TAKE 4 CAPSULES BY MOUTH 1 TIME A DAY FOR 5 DAYS OF 28 DAY CYCLE. (TOTAL DAILY DOSE OF 400 MG). MAY TAKE ON AN EMPTY STOMACH TO DECREASE NAUSEA AND VOMITING. 20 capsule 0  . temozolomide (TEMODAR) 140 MG capsule TAKE 2 CAPSULES BY MOUTH DAILY FOR 5 DAYS. TAKE ON DAYS 1-5 OF CYCLE 1 OF TREATMENT. MAY TAKE ON AN EMPTY STOMACH TO DECREASE NAUSEA & VING. (Patient not taking: Reported on 09/20/2019) 10 capsule 0  . temozolomide (TEMODAR) 20 MG capsule TAKE 1 CAPSULE BY MOUTH DAILY FOR 5 DAYS. TAKE ON DAYS 1-5 OF CYCLE 1 OF TREATMENT. MAY TAKE ON AN EMPTY STOMACH TO DECREASE NAUSEA & VONG. (Patient not taking: Reported on 09/20/2019) 5 capsule 0   No current facility-administered medications on file prior to visit.    Allergies:  Allergies  Allergen Reactions  . Gadolinium Derivatives Itching   Past Medical History:  Past Medical History:  Diagnosis Date  . BPH (benign prostatic hyperplasia) 07/20/2015  . Brain mass   . Cognitive dysfunction 01/2019  . Fatigue 01/2019  . Headache   . NF (neurofibromatosis) ([facility]) 07/20/2015   NF1   Past Surgical History:  Past Surgical History:  Procedure Laterality Date  . HAND SURGERY Left    pinky - tumor benign  . LEG SURGERY Left    inner thigh tumor - benign  . VENTRICULOSTOMY N/A 03/17/2019   Procedure: ENDOSCOPIC THIRD VENTRICULOSTOMY;  Surgeon: Vallarie Mare, MD;  Location: [facility];  Service: Neurosurgery;  Laterality: N/A;  ENDOSCOPIC THIRD VENTRICULOSTOMY   Social History:  Social History   Socioeconomic History  . Marital status: Married    Spouse name: Not on file  . Number of children: Not on file  . Years of education: Not on file  . Highest education  level: Not on file  Occupational History  . Not on file  Tobacco Use  . Smoking status: Former Smoker    Types: Cigars, Cigarettes  . Smokeless tobacco: Never Used  . Tobacco comment: Quit smoking cigarettes 25 yrs ago, quit cigars in 2020  Vaping Use  . Vaping Use: Never used  Substance and Sexual Activity  . Alcohol use: Yes    Alcohol/week: 6.0 - 9.0 standard drinks    Types: 6 - 9 Standard drinks or equivalent per week  . Drug use: No  . Sexual activity: Not on file  Other Topics Concern  . Not on file  Social History Narrative  . Not on file   Social Determinants of Health   Financial Resource Strain: Not on file  Food Insecurity: Not on file  Transportation Needs: Not  on file  Physical Activity: Not on file  Stress: Not on file  Social Connections: Not on file  Intimate Partner Violence: Not on file   Family History:  Family History  Problem Relation Age of Onset  . Cancer Paternal Grandfather        Prostate    Review of Systems: Constitutional: swelling in face Eyes: Doesn't report blurriness of vision Ears, nose, mouth, throat, and face: Doesn't report sore throat Respiratory: Doesn't report cough, dyspnea or wheezes Cardiovascular: Doesn't report palpitation, chest discomfort  Gastrointestinal:  Doesn't report nausea, constipation, diarrhea GU: Doesn't report incontinence Skin: Doesn't report skin rashes Neurological: Per HPI Musculoskeletal: Doesn't report joint pain Behavioral/Psych: Doesn't report anxiety  Physical Exam: Vitals:   03/06/20 1255  BP: (!) 149/88  Pulse: 60  Resp: 18  Temp: 98.1 F (36.7 C)  SpO2: 98%   KPS: 70. General: cushingoid features Head: Normal EENT: No conjunctival injection or scleral icterus.  Lungs: Resp effort normal Cardiac: Regular rate Abdomen: Non-distended abdomen Skin: No rashes cyanosis or petechiae. Extremities: No clubbing or edema  Neurologic Exam: Mental Status: Awake, alert, attentive to  examiner. Oriented to self and environment. Language is fluent with intact comprehension.  Cranial Nerves: Visual acuity is grossly normal. L visual field impairment. Extra-ocular movements intact. No ptosis. Face is symmetric Motor: Tone and bulk are normal. Power is 3/5 in left arm with poor fine motor and 3/5 in left leg. Reflexes are symmetric, no pathologic reflexes present.  Sensory: Intact to light touch Gait: Hemiparetic, not independent  Labs: I have reviewed the data as listed    Component Value Date/Time   NA 139 03/06/2020 1230   K 3.9 03/06/2020 1230   CL 105 03/06/2020 1230   CO2 28 03/06/2020 1230   GLUCOSE 83 03/06/2020 1230   BUN 23 (H) 03/06/2020 1230   CREATININE 0.89 03/06/2020 1230   CREATININE 0.83 03/04/2019 1206   CALCIUM 9.6 03/06/2020 1230   PROT 7.0 03/06/2020 1230   ALBUMIN 4.0 03/06/2020 1230   AST 11 (L) 03/06/2020 1230   ALT 18 03/06/2020 1230   ALKPHOS 48 03/06/2020 1230   BILITOT 0.8 03/06/2020 1230   GFRNONAA >60 03/06/2020 1230   GFRNONAA 106 03/04/2019 1206   GFRAA >60 10/25/2019 1209   GFRAA 123 03/04/2019 1206   Lab Results  Component Value Date   WBC 5.8 03/06/2020   NEUTROABS 3.6 03/06/2020   HGB 15.6 03/06/2020   HCT 43.9 03/06/2020   MCV 88.5 03/06/2020   PLT 262 03/06/2020   Assessment/Plan Anaplastic astrocytoma of thalamus ([place]) [C71.0]  Aaron Espinoza presents today having completed cycle #9 5-day Temozolomide.  He has notable motor deficits on the left, but these are stable today. Labs are within normal limits.  We then recommended continuing with cycle #10 Temozolomide 200 mg/m2, on for five days and off for twenty three days in twenty eight day cycles. The patient will have a complete blood count performed on days 21 and 28 of each cycle, and a comprehensive metabolic panel performed on day 28 of each cycle. Labs may need to be performed more often. Zofran will prescribed for home use for nausea/vomiting.   Chemotherapy  should be held for the following:  ANC less than 1,000  Platelets less than 100,000  LFT or creatinine greater than 2x ULN  If clinical concerns/contraindications develop  We strongly recommended re-initiating physical and occupational therapy given dense deficits and functional impairment.  Will place referral for outpatient neuro-PT/OT.  Will decrease decadron to 36m daily, and he may decrease further to 141min 2 weeks if comfortable.  We appreciate the opportunity to participate in the care of Aaron Espinoza He should return to clinic in 1 month with MRI brain for review prior to cycle #11.  All questions were answered. The patient knows to call the clinic with any problems, questions or concerns. No barriers to learning were detected.  I have spent a total of 30 minutes of face-to-face and non-face-to-face time, excluding clinical staff time, preparing to see patient, ordering tests and/or medications, counseling the patient, and independently interpreting results and communicating results to the patient/family/caregiver   ZaVentura SellersMD Medical Director of Neuro-Oncology [facility]1/31/22 1:18 PM

## 2020-03-15 ENCOUNTER — Encounter: Payer: Self-pay | Admitting: Internal Medicine

## 2020-03-20 ENCOUNTER — Other Ambulatory Visit: Payer: Self-pay | Admitting: Radiation Therapy

## 2020-03-21 ENCOUNTER — Other Ambulatory Visit: Payer: Self-pay | Admitting: Internal Medicine

## 2020-03-21 NOTE — Telephone Encounter (Signed)
Rx refill request

## 2020-03-27 ENCOUNTER — Other Ambulatory Visit: Payer: Self-pay | Admitting: Internal Medicine

## 2020-03-27 NOTE — Telephone Encounter (Signed)
Rx refill request

## 2020-03-28 ENCOUNTER — Ambulatory Visit (HOSPITAL_COMMUNITY): Admission: RE | Admit: 2020-03-28 | Payer: BC Managed Care – PPO | Source: Ambulatory Visit

## 2020-03-28 ENCOUNTER — Other Ambulatory Visit: Payer: Self-pay | Admitting: Internal Medicine

## 2020-03-28 NOTE — Telephone Encounter (Signed)
Last note states possible taper.  Refill request received.  Please review.

## 2020-03-29 ENCOUNTER — Encounter: Payer: Self-pay | Admitting: Internal Medicine

## 2020-03-30 ENCOUNTER — Other Ambulatory Visit: Payer: Self-pay

## 2020-03-30 ENCOUNTER — Ambulatory Visit: Payer: BC Managed Care – PPO | Attending: Internal Medicine | Admitting: Occupational Therapy

## 2020-03-30 DIAGNOSIS — R2681 Unsteadiness on feet: Secondary | ICD-10-CM

## 2020-03-30 DIAGNOSIS — R278 Other lack of coordination: Secondary | ICD-10-CM | POA: Diagnosis not present

## 2020-03-30 DIAGNOSIS — R414 Neurologic neglect syndrome: Secondary | ICD-10-CM | POA: Insufficient documentation

## 2020-03-30 DIAGNOSIS — R41842 Visuospatial deficit: Secondary | ICD-10-CM | POA: Diagnosis not present

## 2020-03-30 DIAGNOSIS — R208 Other disturbances of skin sensation: Secondary | ICD-10-CM | POA: Insufficient documentation

## 2020-03-30 DIAGNOSIS — G8114 Spastic hemiplegia affecting left nondominant side: Secondary | ICD-10-CM | POA: Insufficient documentation

## 2020-03-30 NOTE — Therapy (Signed)
[facility] [address] [place], [place], [address] Phone: [phone]   Fax:  [phone]  Occupational Therapy Evaluation  Patient Details  Name: Aaron Espinoza MRN: [id number] Date of Birth: December 15, 1975 Referring Provider (OT): Dr. Arlan Organ   Encounter Date: 03/30/2020   OT End of Session - 03/30/20 1614    Visit Number 1    Number of Visits 25    Date for OT Re-Evaluation 06/24/20    Authorization Type BC/BS - [place]: [place]    OT Start Time 1320    OT Stop Time 1410    OT Time Calculation (min) 50 min    Activity Tolerance Patient tolerated treatment well    Behavior During Therapy [facility] for tasks assessed/performed           Past Medical History:  Diagnosis Date  . BPH (benign prostatic hyperplasia) 07/20/2015  . Brain mass   . Cognitive dysfunction 01/2019  . Fatigue 01/2019  . Headache   . NF (neurofibromatosis) ([place]) 07/20/2015   NF1    Past Surgical History:  Procedure Laterality Date  . HAND SURGERY Left    pinky - tumor benign  . LEG SURGERY Left    inner thigh tumor - benign  . VENTRICULOSTOMY N/A 03/17/2019   Procedure: ENDOSCOPIC THIRD VENTRICULOSTOMY;  Surgeon: Vallarie Mare, MD;  Location: [facility];  Service: Neurosurgery;  Laterality: N/A;  ENDOSCOPIC THIRD VENTRICULOSTOMY    There were no vitals filed for this visit.   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 No/denies             [facility] OT Assessment - 03/30/20 0001      Assessment   Medical Diagnosis Anaplastic astrocytoma of thalamus    Referring Provider (OT)  Dr. Arlan Organ    Onset Date/Surgical Date --   Jan 2021, but most decline within last 6 months   Hand Dominance Right    Prior Therapy outpatient P.T. ([facility])      Precautions   Precautions Fall    Precaution Comments high fall risk, active CA      Balance Screen   Has the patient fallen in the past 6 months Yes    How many times? --   multiple times     Home  Environment   Bathroom Shower/Tub Tub/Shower unit    Adaptive equipment Long-handled sponge;Reacher;Long-handled TEFL teacher seat;Cane -quad   uses urinal, pt has rollator but reports he has RW?   Additional Comments Pt lives on 1st floor (guest bedroom) of 2 story home with 2 steps to enter.    Lives With Spouse      Prior Function   Level of Independence Independent   prior to approx 6 months ago   Vocation On disability    Vocation Requirements Worked at [employer] for 20 years    Leisure did disc golf before      ADL   Eating/Feeding Needs assist with cutting food    Grooming Modified independent  Upper Body Bathing Supervision/safety   seated   Lower Body Bathing Supervision/safety   seated   Upper Body Dressing Moderate assistance    Lower Body Dressing Maximal assistance    Toilet Transfer --   fluctuating assistance   Toileting - Clothing Manipulation --   fluctuating assistance   Tub/Shower Transfer Moderate assistance   per pt report, but needs tub bench for safety (pt provided w/ handout and referral for MD to sign)   ADL comments does not participate in any IADLS for the last 4-6 months and hasn't driven the past year      Mobility   Mobility Status Needs assist    Mobility Status Comments had been walking with cane w/ assist from wife per pt report, but then began using rollator (therapist feels this is unsafe). Pt fell in parking lot before evaluation and required w/c      Written Expression   Dominant Hand Right    Handwriting --   no changes per pt report     Vision -  History   Baseline Vision Wears glasses all the time    Additional Comments Lt inattention      Vision Assessment   Visual Fields Left visual field deficit      Cognition   Cognition Comments Pt reports intact but demo decreased awareness into abilities, decr attention to Lt side, and repeats himself multiple times during evaluation      Observation/Other Assessments   Observations dense Lt hemiplegia from tumor, Lt inattention and visual field cut, abscent sensation/proprioception Lt side, apraxia      Posture/Postural Control   Posture/Postural Control No significant limitations      Sensation   Light Touch Impaired Detail    Light Touch Impaired Details Impaired LUE   unable to detect/localize 90% but fluctuates some in hand   Proprioception Impaired Detail   abscent   Proprioception Impaired Details Absent LUE      Coordination   Coordination no functional use of LUE at this time (RUE intact)      Perception   Perception Impaired    Inattention/Neglect Does not attend to left side of body      Praxis   Praxis Impaired    Praxis Impairment Details Motor planning      Edema   Edema minimal Lt hand      Tone   Assessment Location Left Upper Extremity      ROM / Strength   AROM / PROM / Strength AROM;PROM      AROM   Overall AROM  Deficits    Overall AROM Comments LUE w/ very limited active movement dominated by synergy pattern w/ scaption motion at shoulder (sh hiking and abd, IR, elbow flex and pronation). Pt has 50% elbow flex and 75% composite finger flexion, minimal finger extension      PROM   Overall PROM  Within functional limits for tasks performed   approx 75% shoulder ROM with no pain     LUE Tone   LUE Tone Hypertonic;Modified Ashworth      LUE Tone   Modified Ashworth Scale for Grading Hypertonia LUE More marked increase in muscle tone through most of the ROM, but affected part(s) easily moved   3/4 at elbow                           OT Education - 03/30/20 1615    Education Details Recommendation for tub  bench, discussed potential transfer to AF location after PT evaluation (d/t commute)    Person(s) Educated Patient;Parent(s)    Methods Explanation;Handout    Comprehension Verbalized understanding            OT Short Term Goals - 03/30/20 1625      OT SHORT TERM GOAL #1   Title Independent with HEP for LUE (self ROM/stretches)    Time 4    Period Weeks    Status New      OT SHORT TERM GOAL #2   Title Pt to safely transfer from bed to chair and to commode w/ mod assist    Time 4    Period Weeks    Status New      OT SHORT TERM GOAL #3   Title Pt to verbalize understanding with one handed techniques and A/E needs to increase independence with cutting food and dressing (rocker knife, shoe buttons, etc)    Time 4    Period Weeks    Status New      OT SHORT TERM GOAL #4   Title Pt to be independent with splint wear and care for Lt hand (? resting hand splint)    Time 4    Period Weeks    Status New      OT SHORT TERM GOAL #5   Title Pt to verbalize understanding with visual scanning strategies and safety considerations due to lack of sensation Lt side    Time 4    Period Weeks    Status New             OT Long Term Goals - 03/30/20 1629      OT LONG TERM GOAL #1   Title Pt to be mod I with all BADLS    Time 12    Period Weeks    Status New      OT LONG TERM GOAL #2   Title Pt to demo use of LUE as gross assist for bilateral tasks and ADLS    Time 12    Period Weeks    Status New      OT LONG TERM GOAL #3   Title Pt to perform simple snack and sandwich prep w/ necessary DME at distant supervision level (seated level prn)    Time 12    Period Weeks    Status New      OT LONG TERM GOAL #4   Title Pt to consistently attend to Lt side of body with transfers and functional ambulation and scanning activities    Time 12    Period Weeks    Status New       OT LONG TERM GOAL #5   Title Pt to perform gross finger extension to 75% or greater to release cylindrical objects    Time 12    Period Weeks    Status New      Long Term Additional Goals   Additional Long Term Goals Yes      OT LONG TERM GOAL #6   Title Pt to demo shoulder flexion to 40* for low level reaching    Time 12    Period Weeks    Status New                 Plan - 03/30/20 1616    Clinical Impression Statement Pt is a 45 y.o. 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 Pt has had  rapid decline in function over the last 6 months (per pt report) and presents with Lt non dominant side dense hemiplegia, and significant deficits in balance, LUE movement, sensation including proprioception, apraxia, and Lt visual field cut. Pt reports he is now on disability and still takes chemo 1x/month for 5 consecutive days. Pt very discouraged because his balance and ambulation has declined more recently and arm function has gotten worse. Pt fell in parking lot of clinic before his scheduled evaluation (this therapist did not witness) and he required w/c to get into clinic. Pt will benefit from skilled O.T. services to address above mentioned deficits, increase safety and independence with ADLS, explore A/E options, and improve quality of life.    OT Occupational Profile and History Detailed Assessment- Review of Records and additional review of physical, cognitive, psychosocial history related to current functional performance    Occupational performance deficits (Please refer to evaluation for details): ADL's;IADL's;Leisure;Social Participation    Body Structure / Function / Physical Skills ADL;Decreased knowledge of use of DME;Strength;Dexterity;GMC;Balance;Tone;UE functional use;Proprioception;Body mechanics;IADL;ROM;Vision;Mobility;Coordination;Decreased knowledge of precautions;FMC    Cognitive Skills Attention;Perception    Rehab  Potential Fair   given diagnosis   Clinical Decision Making Multiple treatment options, significant modification of task necessary    Comorbidities Affecting Occupational Performance: May have comorbidities impacting occupational performance    Modification or Assistance to Complete Evaluation  Min-Moderate modification of tasks or assist with assess necessary to complete eval    OT Frequency 2x / week    OT Duration 12 weeks   plus evaluation   OT Treatment/Interventions Self-care/ADL training;Moist Heat;DME and/or AE instruction;Splinting;Therapeutic activities;Therapeutic exercise;Cognitive remediation/compensation;Coping strategies training;Neuromuscular education;Functional Mobility Training;Passive range of motion;Visual/perceptual remediation/compensation;Energy conservation;Manual Therapy;Patient/family education;Electrical Stimulation;Aquatic Therapy    Plan HEP (self ROM/stretches for LUE), assess for resting hand splint?    Consulted and Agree with Plan of Care Patient           Patient will benefit from skilled therapeutic intervention in order to improve the following deficits and impairments:   Body Structure / Function / Physical Skills: ADL,Decreased knowledge of use of DME,Strength,Dexterity,GMC,Balance,Tone,UE functional use,Proprioception,Body mechanics,IADL,ROM,Vision,Mobility,Coordination,Decreased knowledge of precautions,FMC Cognitive Skills: Attention,Perception     Visit Diagnosis: Spastic hemiplegia affecting left nondominant side, unspecified etiology ([place])  Unsteadiness on feet  Visuospatial deficit  Neurological neglect syndrome  Other disturbances of skin sensation  Other lack of coordination    Problem List Patient Active Problem List   Diagnosis Date Noted  . Right arm pain 07/27/2019  . Anaplastic astrocytoma of thalamus ([place]) 03/17/2019  . NF (neurofibromatosis) ([place]) 07/20/2015  . BPH (benign prostatic hyperplasia) 07/20/2015  . ED  (erectile dysfunction) 07/20/2015  . Seborrheic keratoses 07/20/2015  . Cherry angioma 07/20/2015    Carey Bullocks, OTR/L 03/30/2020, 4:39 PM  [facility] [address] [place], [place], [address] Phone: [phone]   Fax:  [phone]  Name: Aaron Espinoza MRN: [id number] Date of Birth: 1975-10-01

## 2020-04-01 ENCOUNTER — Ambulatory Visit (HOSPITAL_COMMUNITY)
Admission: RE | Admit: 2020-04-01 | Discharge: 2020-04-01 | Disposition: A | Discharge status: Home / Self Care | Payer: BC Managed Care – PPO | Source: Ambulatory Visit | Attending: Internal Medicine | Admitting: Internal Medicine

## 2020-04-01 DIAGNOSIS — G9389 Other specified disorders of brain: Secondary | ICD-10-CM | POA: Diagnosis not present

## 2020-04-01 DIAGNOSIS — D496 Neoplasm of unspecified behavior of brain: Secondary | ICD-10-CM | POA: Diagnosis not present

## 2020-04-01 DIAGNOSIS — C719 Malignant neoplasm of brain, unspecified: Secondary | ICD-10-CM | POA: Diagnosis not present

## 2020-04-01 DIAGNOSIS — C710 Malignant neoplasm of cerebrum, except lobes and ventricles: Secondary | ICD-10-CM | POA: Diagnosis not present

## 2020-04-01 IMAGING — MR MR HEAD WO/W CM
16 series · 48 of 48 positions shown · IV contrast (gadavist)
Comparison: MRI of the head without and with contrast [DATE] and
[DATE]

CLINICAL DATA: Anaplastic astrocytoma. Assess treatment response.

EXAM:
MRI HEAD WITHOUT AND WITH CONTRAST
TECHNIQUE: Multiplanar, multiecho pulse sequences of the brain and surrounding
structures were obtained without and with intravenous contrast.
CONTRAST:  9mL GADAVIST GADOBUTROL 1 MMOL/ML IV SOLN

[Series 5: DWI · axial · 3.0mm · 1.36mm/px · z∈[-32,+131]mm · 4 of 116 slices shown (1 of 4)]
[im 1/116]
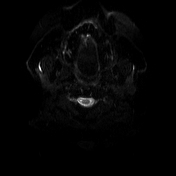
[im 39/116]
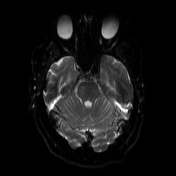
[im 77/116]
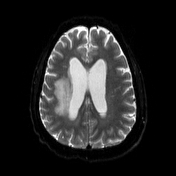
[im 116/116]
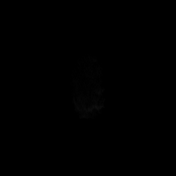

[Series 6: DWI · axial · 3.0mm · 1.36mm/px · z∈[-32,+131]mm · 2 of 58 slices shown (2 of 4)]
[im 1/58]
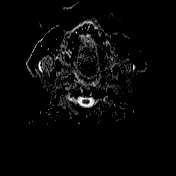
[im 58/58]
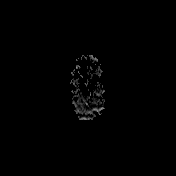

[Series 7: mip_images(sw) · axial · 24.0mm · 0.72mm/px · z∈[-20,+118]mm · 2 of 49 slices shown]
[im 1/49]
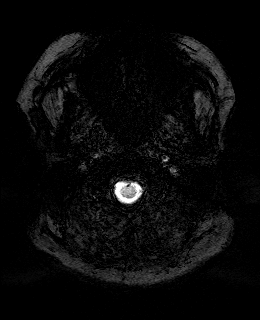
[im 49/49]
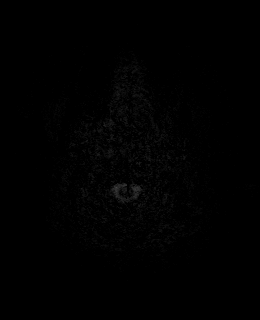

[Series 8: swi_images · axial · 3.0mm · 0.72mm/px · z∈[-30,+128]mm · 2 of 56 slices shown]
[im 1/56]
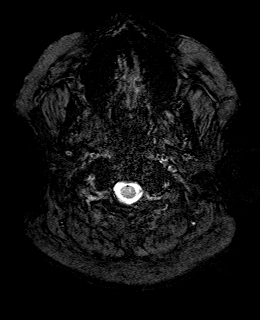
[im 56/56]
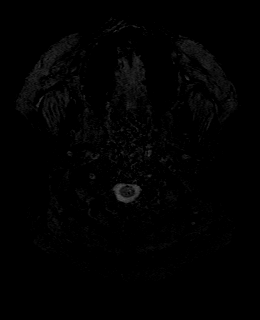

[Series 9: FLAIR · axial · 3.0mm · 0.72mm/px · z∈[-29,+129]mm · 2 of 56 slices shown]
[im 1/56]
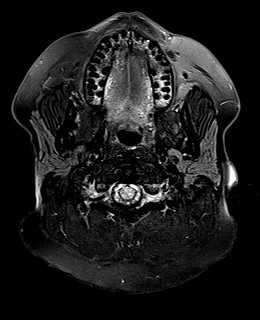
[im 56/56]
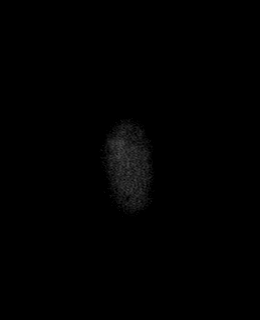

[Series 10: DWI · coronal · 5.0mm · 1.31mm/px · 3 of 76 slices shown (3 of 4)]
[im 1/76]
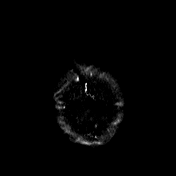
[im 38/76]
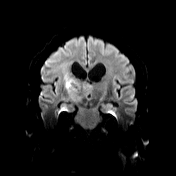
[im 76/76]
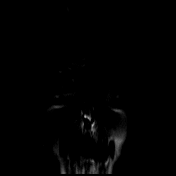

[Series 11: DWI · coronal · 5.0mm · 1.31mm/px · 1 of 38 slices shown (4 of 4)]
[im 1/38]
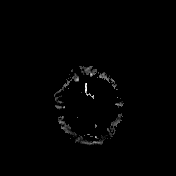

[Series 12: T1 · sagittal · 5.0mm · 0.75mm/px · 1 of 25 slices shown (1 of 2)]
[im 1/25]
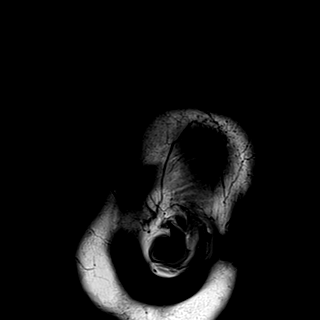

[Series 13: T2 · axial · 5.0mm · 0.60mm/px · 1 of 26 slices shown (1 of 2)]
[im 1/26]
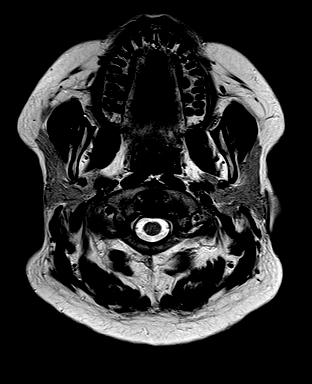

[Series 14: T1 · axial · 1.0mm · 0.90mm/px · z∈[-35,+132]mm · 7 of 176 slices shown (2 of 2)]
[im 1/176]
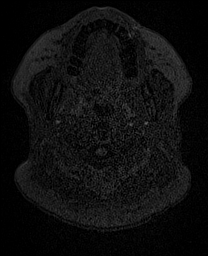
[im 30/176]
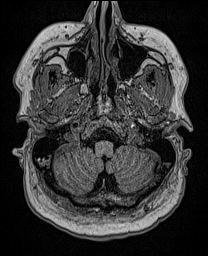
[im 59/176]
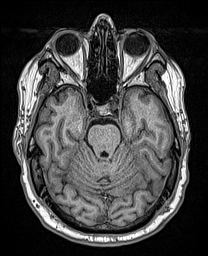
[im 88/176]
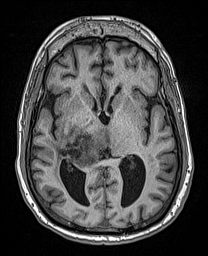
[im 117/176]
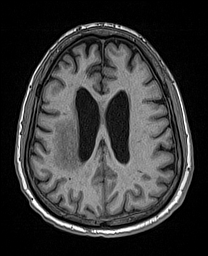
[im 146/176]
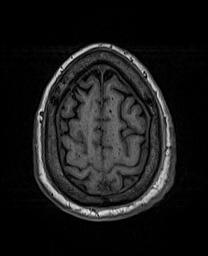
[im 176/176]
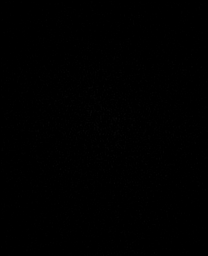

[Series 15: T2 · coronal · 5.0mm · 0.57mm/px · 1 of 30 slices shown (2 of 2)]
[im 1/30]
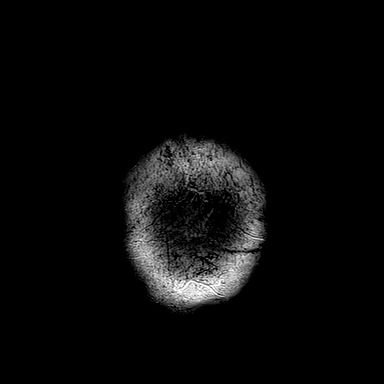

[Series 16: T1 post-contrast · axial · 1.0mm · 0.90mm/px · z∈[-35,+132]mm · 7 of 176 slices shown (1 of 3)]
[im 1/176]
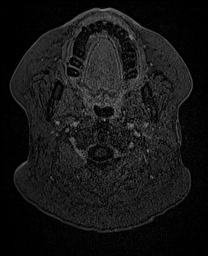
[im 30/176]
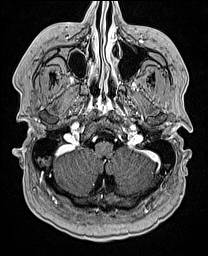
[im 59/176]
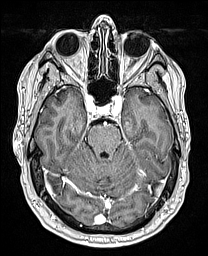
[im 88/176]
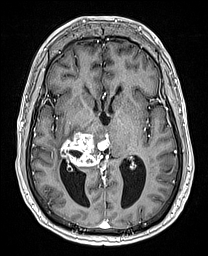
[im 117/176]
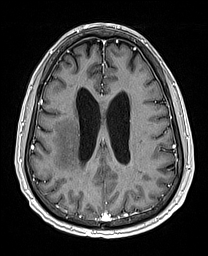
[im 146/176]
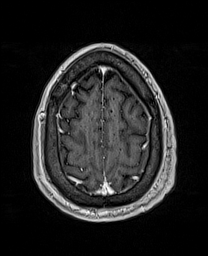
[im 176/176]
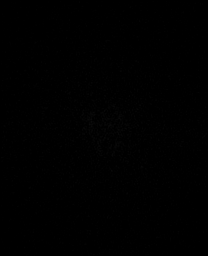

[Series 17: T1 post-contrast · coronal · 5.0mm · 0.43mm/px · 1 of 30 slices shown (2 of 3)]
[im 1/30]
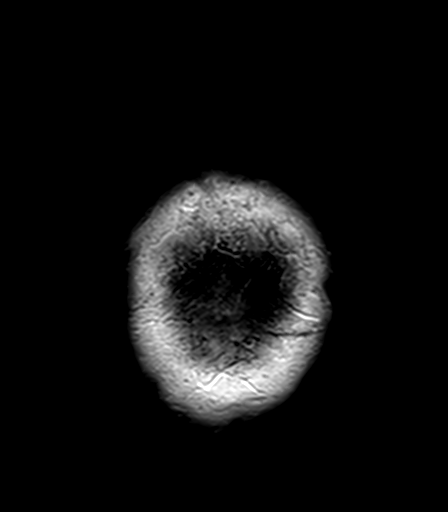

[Series 18: T1 post-contrast · sagittal · 5.0mm · 0.75mm/px · 1 of 25 slices shown (3 of 3)]
[im 1/25]
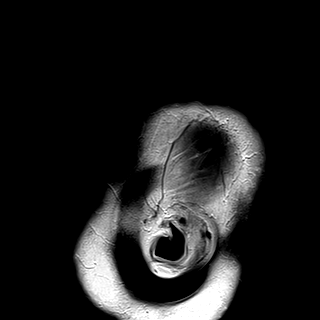

[Series 100: <mpr range> · axial · 1.0mm · 0.48mm/px · z∈[+52,+125]mm · 7 of 177 slices shown]
[im 1/177]
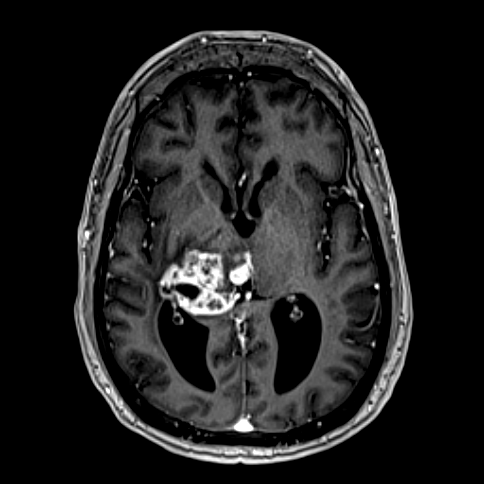
[im 30/177]
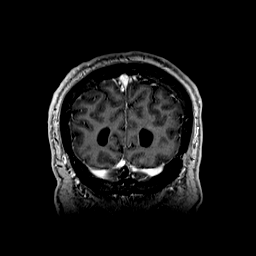
[im 59/177]
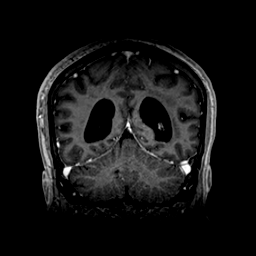
[im 89/177]
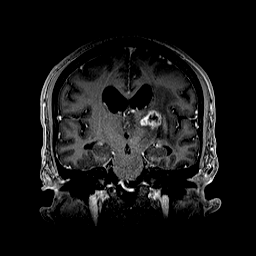
[im 118/177]
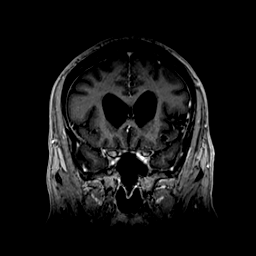
[im 147/177]
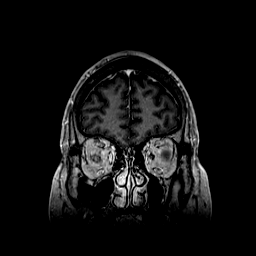
[im 177/177]
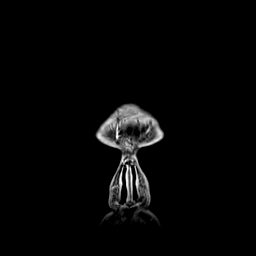

[Series 101: <mpr range(1)> · axial · 1.0mm · 0.48mm/px · z∈[+52,+156]mm · 6 of 169 slices shown]
[im 1/169]
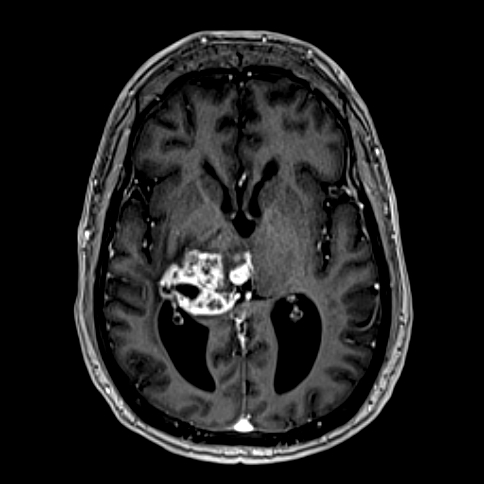
[im 34/169]
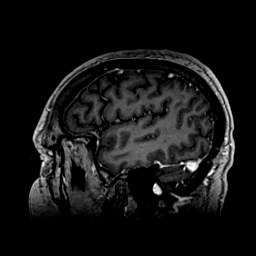
[im 68/169]
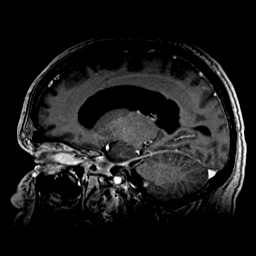
[im 101/169]
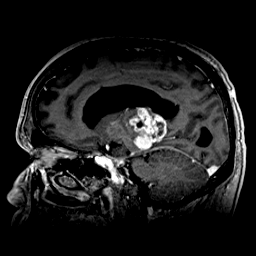
[im 135/169]
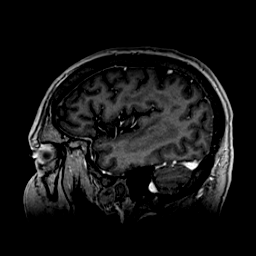
[im 169/169]
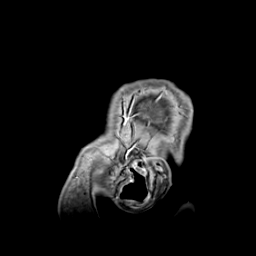

[48 of 48 positions shown; findings below may reference images not displayed]

FINDINGS: Brain: Heterogeneous HANS OLIVER enhancing tumor involving the right
thalamus with extension into the third ventricle is slightly
decreased in size compared to the last 2 exams. The lesion now
measures 3.7 x 3.5 x 3.6 cm. Previous measurements in the same plane
were 4.0 x 3.6 x 3.8 cm. Tumor and third ventricle is similar the
prior exam.

Mild dilation of the ventricles is stable.

No new tumor enhancement is present. T2 signal changes extend into
the right side of the brainstem, the right external capsule and
corona radiata, similar the prior exam.

No acute infarct or hemorrhage is present.

Vascular: Flow is present in the major intracranial arteries.

Skull and upper cervical spine: The craniocervical junction is
normal. Upper cervical spine is within normal limits. Marrow signal
is unremarkable.

Sinuses/Orbits: The paranasal sinuses and mastoid air cells are
clear. The globes and orbits are within normal limits.
IMPRESSION: 1. Slight decrease in size of treated enhancing tumor involving the
right thalamus with extension into the third ventricle.
2. Stable T2 signal changes extending into the right side of the
brainstem, external capsule, and corona radiata, likely related to
radiation therapy.
3. Stable mild dilation of the ventricles suggesting partial
obstruction by the tumor mass.
4. No new lesions.

## 2020-04-01 MED ORDER — GADOBUTROL 1 MMOL/ML IV SOLN
9.0000 mL | Freq: Once | INTRAVENOUS | Status: AC | PRN
  Administered 2020-04-01: 9 mL via INTRAVENOUS

## 2020-04-03 ENCOUNTER — Inpatient Hospital Stay: Payer: BC Managed Care – PPO

## 2020-04-03 ENCOUNTER — Inpatient Hospital Stay: Payer: BC Managed Care – PPO | Attending: Internal Medicine

## 2020-04-03 ENCOUNTER — Other Ambulatory Visit: Payer: Self-pay

## 2020-04-03 ENCOUNTER — Inpatient Hospital Stay: Payer: BC Managed Care – PPO | Admitting: Internal Medicine

## 2020-04-03 VITALS — BP 128/89 | HR 20 | Temp 97.9°F | Resp 20 | Ht 72.0 in

## 2020-04-03 DIAGNOSIS — C710 Malignant neoplasm of cerebrum, except lobes and ventricles: Secondary | ICD-10-CM

## 2020-04-03 DIAGNOSIS — R5383 Other fatigue: Secondary | ICD-10-CM | POA: Diagnosis not present

## 2020-04-03 DIAGNOSIS — R270 Ataxia, unspecified: Secondary | ICD-10-CM | POA: Diagnosis not present

## 2020-04-03 DIAGNOSIS — Q8500 Neurofibromatosis, unspecified: Secondary | ICD-10-CM | POA: Insufficient documentation

## 2020-04-03 DIAGNOSIS — Z79899 Other long term (current) drug therapy: Secondary | ICD-10-CM | POA: Diagnosis not present

## 2020-04-03 DIAGNOSIS — Z923 Personal history of irradiation: Secondary | ICD-10-CM | POA: Diagnosis not present

## 2020-04-03 DIAGNOSIS — Z7952 Long term (current) use of systemic steroids: Secondary | ICD-10-CM | POA: Insufficient documentation

## 2020-04-03 DIAGNOSIS — R531 Weakness: Secondary | ICD-10-CM | POA: Insufficient documentation

## 2020-04-03 DIAGNOSIS — Z9221 Personal history of antineoplastic chemotherapy: Secondary | ICD-10-CM | POA: Insufficient documentation

## 2020-04-03 DIAGNOSIS — N400 Enlarged prostate without lower urinary tract symptoms: Secondary | ICD-10-CM | POA: Diagnosis not present

## 2020-04-03 DIAGNOSIS — Z87891 Personal history of nicotine dependence: Secondary | ICD-10-CM | POA: Diagnosis not present

## 2020-04-03 LAB — CBC WITH DIFFERENTIAL (CANCER CENTER ONLY)
Abs Immature Granulocytes: 0.04 10*3/uL (ref 0.00–0.07)
Basophils Absolute: 0 10*3/uL (ref 0.0–0.1)
Basophils Relative: 1 %
Eosinophils Absolute: 0.1 10*3/uL (ref 0.0–0.5)
Eosinophils Relative: 1 %
HCT: 44.8 % (ref 39.0–52.0)
Hemoglobin: 16 g/dL (ref 13.0–17.0)
Immature Granulocytes: 1 %
Lymphocytes Relative: 15 %
Lymphs Abs: 0.6 10*3/uL — ABNORMAL LOW (ref 0.7–4.0)
MCH: 31.7 pg (ref 26.0–34.0)
MCHC: 35.7 g/dL (ref 30.0–36.0)
MCV: 88.9 fL (ref 80.0–100.0)
Monocytes Absolute: 0.7 10*3/uL (ref 0.1–1.0)
Monocytes Relative: 16 %
Neutro Abs: 2.8 10*3/uL (ref 1.7–7.7)
Neutrophils Relative %: 66 %
Platelet Count: 268 10*3/uL (ref 150–400)
RBC: 5.04 MIL/uL (ref 4.22–5.81)
RDW: 12.3 % (ref 11.5–15.5)
WBC Count: 4.2 10*3/uL (ref 4.0–10.5)
nRBC: 0 % (ref 0.0–0.2)

## 2020-04-03 LAB — CMP (CANCER CENTER ONLY)
ALT: 15 U/L (ref 0–44)
AST: 11 U/L — ABNORMAL LOW (ref 15–41)
Albumin: 4.1 g/dL (ref 3.5–5.0)
Alkaline Phosphatase: 62 U/L (ref 38–126)
Anion gap: 8 (ref 5–15)
BUN: 18 mg/dL (ref 6–20)
CO2: 23 mmol/L (ref 22–32)
Calcium: 9.5 mg/dL (ref 8.9–10.3)
Chloride: 106 mmol/L (ref 98–111)
Creatinine: 0.85 mg/dL (ref 0.61–1.24)
GFR, Estimated: >60 mL/min (ref >60)
Glucose, Bld: 105 mg/dL — ABNORMAL HIGH (ref 70–99)
Potassium: 3.7 mmol/L (ref 3.5–5.1)
Sodium: 137 mmol/L (ref 135–145)
Total Bilirubin: 0.8 mg/dL (ref 0.3–1.2)
Total Protein: 7.3 g/dL (ref 6.5–8.1)

## 2020-04-03 MED ORDER — TEMOZOLOMIDE 100 MG PO CAPS: 200.0000 mg/m2/d | ORAL_CAPSULE | Freq: Every day | ORAL | 0 refills | Status: DC

## 2020-04-03 NOTE — Progress Notes (Signed)
[facility] at [place], [place] [address] [phone]   Interval Evaluation  Date of Service: 04/03/20 Patient Name: Aaron Espinoza Patient MRN: [id number] Patient DOB: 09/06/1975 Provider: Ventura Sellers, MD  Identifying Statement:  Aaron Espinoza is a 45 y.o. male with right thalamic anaplastic astrocytoma   Oncologic History: Oncology History  Anaplastic astrocytoma of thalamus ([place])  03/17/2019 Surgery   Stereotactic biopsy by Dr. Marcello Moores; path demonstrates anaplastic astrocytoma   04/12/2019 - 05/21/2019 Radiation Therapy   IMRT with concurrent Temodar 90m/m2 daily   06/14/2019 -  Chemotherapy    Patient is on Treatment Plan: BRAIN ANAPLASTIC GLIOMA GRADE III TEMOZOLOMIDE POST XRT Q28D        Biomarkers:  MGMT Unknown.  IDH 1/2 Wild type.  EGFR Unknown  TERT Unknown   Interval History:  Aaron KROFTpresents to clinic for follow up now having completed cycle #10 of 5-day TMZ.  He tolerated chemo and MRI well with month, although has had several falls in recent days.  Left arm and hand are quite weak and in general disuse.  There is some curling of the hand now.  He is now relying full time on a four pointed walker, no longer able to use the cane for ambulation. Fatigue is otherwise stable from prior. No other issues tolerating chemotherapy.  Decadron at 179mdaily, he clearly felt better on 47m39mr higher.  H+P (03/30/19) Patient presented to medical attention in late January, when he noticed mild confusion, gait imbalance.  These symptoms worsened over the next ~10 days, and were accompanied by incontinence.  Brain MRI was performed and demonstrated a right thalamic mass consistent with likely primary brain tumor.  He underwent stereotactic biopsy and ventriculostomy on 03/17/19 with Dr. ThoMarcello MooresFollowing surgery he describes considerable improvement in most of his symptoms; at this time he is near baseline function  but "very fatigued" overall.  No seizures or headaches since surgery.  Medications: Current Outpatient Medications on File Prior to Visit  Medication Sig Dispense Refill  . acetaminophen (TYLENOL) 500 MG tablet Take 1,000 mg by mouth every 6 (six) hours as needed.    . dMarland Kitchenxamethasone (DECADRON) 2 MG tablet TAKE 2 TABLETS BY MOUTH DAILY. 60 tablet 1  . docusate sodium (COLACE) 100 MG capsule Take 100 mg by mouth daily.    . iMarland Kitchenuprofen (ADVIL) 200 MG tablet Take 800 mg by mouth every 6 (six) hours as needed.    . lMarland Kitchensinopril (ZESTRIL) 10 MG tablet TAKE 1 TABLET BY MOUTH EVERY DAY 30 tablet 1  . NON FORMULARY Take 2 capsules by mouth daily. [employer] CORDYCEPS for Energy Support    . NON FORMULARY Take 2 capsules by mouth daily. [employer] BRAIN for Mental Clarity    . NON FORMULARY Take 2 capsules by mouth daily. [employer] STAMETS 7 for Daily Immune Support    . NON FORMULARY Take 2 capsules by mouth daily. [employer] TURKu[place]IL for Immune Support    . ondansetron (ZOFRAN) 8 MG tablet Take 1 tablet (8 mg total) by mouth 2 (two) times daily as needed (nausea and vomiting). May take 30-60 minutes prior to Temodar administration if nausea/vomiting occurs. (Patient not taking: Reported on 09/20/2019) 30 tablet 1  . pantoprazole (PROTONIX) 40 MG tablet TAKE 1 TABLET BY MOUTH EVERY DAY 30 tablet 3  . polyethylene glycol (MIRALAX / GLYCOLAX) 17 g packet Take 1 packet by mouth daily.    .Marland Kitchen  STOOL SOFTENER 100 MG capsule TAKE 1 TABLET BY MOUTH 2 TIMES EVERY DAY AT BEDTIME AS NEEDED 60 tablet 2  . temozolomide (TEMODAR) 100 MG capsule TAKE 4 CAPSULES BY MOUTH 1 TIME A DAY FOR 5 DAYS OF 28 DAY CYCLE. (TOTAL DAILY DOSE OF 400 MG). MAY TAKE ON AN EMPTY STOMACH TO DECREASE NAUSEA AND VOMITING. 20 capsule 0  . temozolomide (TEMODAR) 100 MG capsule Take 4 capsules (400 mg total) by mouth at bedtime. 20 capsule 0  . temozolomide (TEMODAR) 140 MG capsule TAKE 2 CAPSULES BY MOUTH DAILY FOR 5 DAYS. TAKE ON DAYS  1-5 OF CYCLE 1 OF TREATMENT. MAY TAKE ON AN EMPTY STOMACH TO DECREASE NAUSEA & VING. (Patient not taking: Reported on 09/20/2019) 10 capsule 0  . temozolomide (TEMODAR) 20 MG capsule TAKE 1 CAPSULE BY MOUTH DAILY FOR 5 DAYS. TAKE ON DAYS 1-5 OF CYCLE 1 OF TREATMENT. MAY TAKE ON AN EMPTY STOMACH TO DECREASE NAUSEA & VONG. (Patient not taking: Reported on 09/20/2019) 5 capsule 0   No current facility-administered medications on file prior to visit.    Allergies:  Allergies  Allergen Reactions  . Gadolinium Derivatives Itching   Past Medical History:  Past Medical History:  Diagnosis Date  . BPH (benign prostatic hyperplasia) 07/20/2015  . Brain mass   . Cognitive dysfunction 01/2019  . Fatigue 01/2019  . Headache   . NF (neurofibromatosis) ([place]) 07/20/2015   NF1   Past Surgical History:  Past Surgical History:  Procedure Laterality Date  . HAND SURGERY Left    pinky - tumor benign  . LEG SURGERY Left    inner thigh tumor - benign  . VENTRICULOSTOMY N/A 03/17/2019   Procedure: ENDOSCOPIC THIRD VENTRICULOSTOMY;  Surgeon: Vallarie Mare, MD;  Location: [facility];  Service: Neurosurgery;  Laterality: N/A;  ENDOSCOPIC THIRD VENTRICULOSTOMY   Social History:  Social History   Socioeconomic History  . Marital status: Married    Spouse name: Not on file  . Number of children: Not on file  . Years of education: Not on file  . Highest education level: Not on file  Occupational History  . Not on file  Tobacco Use  . Smoking status: Former Smoker    Types: Cigars, Cigarettes  . Smokeless tobacco: Never Used  . Tobacco comment: Quit smoking cigarettes 25 yrs ago, quit cigars in 2020  Vaping Use  . Vaping Use: Never used  Substance and Sexual Activity  . Alcohol use: Yes    Alcohol/week: 6.0 - 9.0 standard drinks    Types: 6 - 9 Standard drinks or equivalent per week  . Drug use: No  . Sexual activity: Not on file  Other Topics Concern  . Not on file  Social History Narrative   . Not on file   Social Determinants of Health   Financial Resource Strain: Not on file  Food Insecurity: Not on file  Transportation Needs: Not on file  Physical Activity: Not on file  Stress: Not on file  Social Connections: Not on file  Intimate Partner Violence: Not on file   Family History:  Family History  Problem Relation Age of Onset  . Cancer Paternal Grandfather        Prostate    Review of Systems: Constitutional: swelling in face Eyes: Doesn't report blurriness of vision Ears, nose, mouth, throat, and face: Doesn't report sore throat Respiratory: Doesn't report cough, dyspnea or wheezes Cardiovascular: Doesn't report palpitation, chest discomfort  Gastrointestinal:  Doesn't report nausea, constipation,  diarrhea GU: Doesn't report incontinence Skin: Doesn't report skin rashes Neurological: Per HPI Musculoskeletal: Doesn't report joint pain Behavioral/Psych: Doesn't report anxiety  Physical Exam: Vitals:   04/03/20 1258  BP: 128/89  Pulse: (!) 20  Resp: 20  Temp: 97.9 F (36.6 C)  SpO2: 96%   KPS: 60. General: cushingoid features Head: Normal EENT: No conjunctival injection or scleral icterus.  Lungs: Resp effort normal Cardiac: Regular rate Abdomen: Non-distended abdomen Skin: No rashes cyanosis or petechiae. Extremities: No clubbing or edema  Neurologic Exam: Mental Status: Awake, alert, attentive to examiner. Oriented to self and environment. Language is fluent with intact comprehension.  Cranial Nerves: Visual acuity is grossly normal. L visual field impairment. Extra-ocular movements intact. No ptosis. Face is symmetric Motor: Tone and bulk are normal. Power is 2/5 in left arm and 3/5 in left leg. Reflexes are symmetric, no pathologic reflexes present.  Sensory: Intact to light touch Gait: Hemiparetic, not independent  Labs: I have reviewed the data as listed    Component Value Date/Time   NA 139 03/06/2020 1230   K 3.9 03/06/2020 1230    CL 105 03/06/2020 1230   CO2 28 03/06/2020 1230   GLUCOSE 83 03/06/2020 1230   BUN 23 (H) 03/06/2020 1230   CREATININE 0.89 03/06/2020 1230   CREATININE 0.83 03/04/2019 1206   CALCIUM 9.6 03/06/2020 1230   PROT 7.0 03/06/2020 1230   ALBUMIN 4.0 03/06/2020 1230   AST 11 (L) 03/06/2020 1230   ALT 18 03/06/2020 1230   ALKPHOS 48 03/06/2020 1230   BILITOT 0.8 03/06/2020 1230   GFRNONAA >60 03/06/2020 1230   GFRNONAA 106 03/04/2019 1206   GFRAA >60 10/25/2019 1209   GFRAA 123 03/04/2019 1206   Lab Results  Component Value Date   WBC 5.8 03/06/2020   NEUTROABS 3.6 03/06/2020   HGB 15.6 03/06/2020   HCT 43.9 03/06/2020   MCV 88.5 03/06/2020   PLT 262 03/06/2020   Imaging:  [facility] Clinician Interpretation: I have personally reviewed the CNS images as listed.  My interpretation, in the context of the patient's clinical presentation, is stable disease  MR BRAIN W WO CONTRAST  Result Date: 04/02/2020 CLINICAL DATA:  Anaplastic astrocytoma. Assess treatment response. EXAM: MRI HEAD WITHOUT AND WITH CONTRAST TECHNIQUE: Multiplanar, multiecho pulse sequences of the brain and surrounding structures were obtained without and with intravenous contrast. CONTRAST:  97m GADAVIST GADOBUTROL 1 MMOL/ML IV SOLN COMPARISON:  MRI of the head without and with contrast 01/27/20 and 12/22/19 FINDINGS: Brain: Heterogeneous Lea enhancing tumor involving the right thalamus with extension into the third ventricle is slightly decreased in size compared to the last 2 exams. The lesion now measures 3.7 x 3.5 x 3.6 cm. Previous measurements in the same plane were 4.0 x 3.6 x 3.8 cm. Tumor and third ventricle is similar the prior exam. Mild dilation of the ventricles is stable. No new tumor enhancement is present. T2 signal changes extend into the right side of the brainstem, the right external capsule and corona radiata, similar the prior exam. No acute infarct or hemorrhage is present. Vascular: Flow is present in the  major intracranial arteries. Skull and upper cervical spine: The craniocervical junction is normal. Upper cervical spine is within normal limits. Marrow signal is unremarkable. Sinuses/Orbits: The paranasal sinuses and mastoid air cells are clear. The globes and orbits are within normal limits. IMPRESSION: 1. Slight decrease in size of treated enhancing tumor involving the right thalamus with extension into the third ventricle. 2. Stable T2  signal changes extending into the right side of the brainstem, external capsule, and corona radiata, likely related to radiation therapy. 3. Stable mild dilation of the ventricles suggesting partial obstruction by the tumor mass. 4. No new lesions. Electronically Signed   By: San Morelle M.D.   On: 04/02/2020 20:53   Assessment/Plan Anaplastic astrocytoma of thalamus ([place]) [C71.0]  Aaron Espinoza presents today having completed cycle #10 5-day Temozolomide.  He continues to experience progressive motor deficits on the left.  Fortunately, MRI demonstrates stable findings.  We then recommended continuing with cycle #11 Temozolomide 200 mg/m2, on for five days and off for twenty three days in twenty eight day cycles. The patient will have a complete blood count performed on days 21 and 28 of each cycle, and a comprehensive metabolic panel performed on day 28 of each cycle. Labs may need to be performed more often. Zofran will prescribed for home use for nausea/vomiting.   Chemotherapy should be held for the following:  ANC less than 1,000  Platelets less than 100,000  LFT or creatinine greater than 2x ULN  If clinical concerns/contraindications develop  We will recommend home PT and OT due to difficulty with travel during the day, related to motor deficits.   [place] with short course of higher dose dex, 54m daily x5 days, followed by 226mthereafter.  We appreciate the opportunity to participate in the care of Aaron Espinoza He should return to clinic in  1 month with labs for review prior to cycle #12.  All questions were answered. The patient knows to call the clinic with any problems, questions or concerns. No barriers to learning were detected.  I have spent a total of 40 minutes of face-to-face and non-face-to-face time, excluding clinical staff time, preparing to see patient, ordering tests and/or medications, counseling the patient, and independently interpreting results and communicating results to the patient/family/caregiver   ZaVentura SellersMD Medical Director of Neuro-Oncology [facility] WeRoland2/28/22 12:58 PM

## 2020-04-20 ENCOUNTER — Encounter: Payer: Self-pay | Admitting: Internal Medicine

## 2020-04-20 ENCOUNTER — Telehealth: Payer: Self-pay

## 2020-04-20 ENCOUNTER — Other Ambulatory Visit: Payer: Self-pay

## 2020-04-20 ENCOUNTER — Telehealth: Payer: Self-pay | Admitting: Pharmacist

## 2020-04-20 DIAGNOSIS — C710 Malignant neoplasm of cerebrum, except lobes and ventricles: Secondary | ICD-10-CM

## 2020-04-20 MED ORDER — TEMOZOLOMIDE 100 MG PO CAPS: 200.0000 mg/m2/d | ORAL_CAPSULE | Freq: Every day | ORAL | 0 refills | Status: DC

## 2020-04-20 NOTE — Telephone Encounter (Addendum)
Oral Oncology Pharmacist Encounter  Received notification from [employer] that prior authorization for Temodar (temozolomide) is required due to change in patient's insurance.    PA submitted verbally to [employer] PA department (phone number: [phone]) Patient's Member ID # [id number] Reference number: [id number] Supporting clinical information faxed to: [phone]  Status is pending.   Oral Oncology Clinic will continue to follow.  Leron Croak, PharmD, BCPS Hematology/Oncology Clinical Pharmacist [facility] [phone] 04/20/2020 3:59 PM

## 2020-04-20 NOTE — Telephone Encounter (Signed)
I called pts wife, Aaron Espinoza, and advised rx of Temodar has been redirected to [facility].  I also advised I will need to follow-up with the RN who sent the original [facility] referral to determine which agency she sent it to. Ms. Aaron Espinoza advised they are out of town 3/23-3/29 but will be available for in home evaluation on or after 3/30. I advised her I will note this just in case we receive a call indicating they have been unreachable for scheduling. Ms. Aaron Espinoza expressed understanding of this information.

## 2020-04-24 ENCOUNTER — Other Ambulatory Visit: Payer: Self-pay | Admitting: Internal Medicine

## 2020-04-24 NOTE — Telephone Encounter (Signed)
Oral Oncology Pharmacist Encounter   Prior Authorization for Temodar (temozolomide) has been approved.     Reference number: [id number] Approved until 04/20/2021   Leron Croak, PharmD, BCPS Hematology/Oncology Clinical Pharmacist [facility] [phone] 04/24/2020 11:32 AM

## 2020-05-04 ENCOUNTER — Other Ambulatory Visit: Payer: Self-pay | Admitting: Internal Medicine

## 2020-05-04 DIAGNOSIS — C710 Malignant neoplasm of cerebrum, except lobes and ventricles: Secondary | ICD-10-CM

## 2020-05-08 NOTE — Telephone Encounter (Signed)
Patient is scheduled to see you 05/15/2020

## 2020-05-15 ENCOUNTER — Telehealth: Payer: Self-pay | Admitting: *Deleted

## 2020-05-15 ENCOUNTER — Inpatient Hospital Stay: Payer: BC Managed Care – PPO

## 2020-05-15 ENCOUNTER — Inpatient Hospital Stay: Payer: BC Managed Care – PPO | Admitting: Internal Medicine

## 2020-05-15 NOTE — Telephone Encounter (Signed)
Referral follow up showed no active agency following patient for PT or OT.  Faxed referral from 04/03/20 to the following agencies, pending one to accept.  Alvis Lemmings [facility] [employer]

## 2020-05-22 ENCOUNTER — Inpatient Hospital Stay: Payer: BC Managed Care – PPO | Attending: Internal Medicine

## 2020-05-22 ENCOUNTER — Inpatient Hospital Stay (HOSPITAL_BASED_OUTPATIENT_CLINIC_OR_DEPARTMENT_OTHER): Payer: BC Managed Care – PPO | Admitting: Internal Medicine

## 2020-05-22 ENCOUNTER — Other Ambulatory Visit: Payer: Self-pay

## 2020-05-22 VITALS — BP 154/77 | HR 59 | Temp 98.1°F | Resp 18 | Ht 72.0 in | Wt 199.8 lb

## 2020-05-22 DIAGNOSIS — N401 Enlarged prostate with lower urinary tract symptoms: Secondary | ICD-10-CM | POA: Insufficient documentation

## 2020-05-22 DIAGNOSIS — Z87891 Personal history of nicotine dependence: Secondary | ICD-10-CM | POA: Diagnosis not present

## 2020-05-22 DIAGNOSIS — C710 Malignant neoplasm of cerebrum, except lobes and ventricles: Secondary | ICD-10-CM

## 2020-05-22 DIAGNOSIS — Z923 Personal history of irradiation: Secondary | ICD-10-CM | POA: Insufficient documentation

## 2020-05-22 DIAGNOSIS — Z79899 Other long term (current) drug therapy: Secondary | ICD-10-CM | POA: Diagnosis not present

## 2020-05-22 DIAGNOSIS — Z9221 Personal history of antineoplastic chemotherapy: Secondary | ICD-10-CM | POA: Diagnosis not present

## 2020-05-22 DIAGNOSIS — Q8500 Neurofibromatosis, unspecified: Secondary | ICD-10-CM | POA: Diagnosis not present

## 2020-05-22 DIAGNOSIS — R5383 Other fatigue: Secondary | ICD-10-CM | POA: Diagnosis not present

## 2020-05-22 DIAGNOSIS — Z7952 Long term (current) use of systemic steroids: Secondary | ICD-10-CM | POA: Insufficient documentation

## 2020-05-22 LAB — CMP (CANCER CENTER ONLY)
ALT: 13 U/L (ref 0–44)
AST: 9 U/L — ABNORMAL LOW (ref 15–41)
Albumin: 4 g/dL (ref 3.5–5.0)
Alkaline Phosphatase: 49 U/L (ref 38–126)
Anion gap: 10 (ref 5–15)
BUN: 13 mg/dL (ref 6–20)
CO2: 28 mmol/L (ref 22–32)
Calcium: 9.5 mg/dL (ref 8.9–10.3)
Chloride: 100 mmol/L (ref 98–111)
Creatinine: 0.74 mg/dL (ref 0.61–1.24)
GFR, Estimated: >60 mL/min (ref >60)
Glucose, Bld: 95 mg/dL (ref 70–99)
Potassium: 3.9 mmol/L (ref 3.5–5.1)
Sodium: 138 mmol/L (ref 135–145)
Total Bilirubin: 0.6 mg/dL (ref 0.3–1.2)
Total Protein: 6.9 g/dL (ref 6.5–8.1)

## 2020-05-22 LAB — CBC WITH DIFFERENTIAL (CANCER CENTER ONLY)
Abs Immature Granulocytes: 0.13 10*3/uL — ABNORMAL HIGH (ref 0.00–0.07)
Basophils Absolute: 0 10*3/uL (ref 0.0–0.1)
Basophils Relative: 1 %
Eosinophils Absolute: 0 10*3/uL (ref 0.0–0.5)
Eosinophils Relative: 1 %
HCT: 45.6 % (ref 39.0–52.0)
Hemoglobin: 16.2 g/dL (ref 13.0–17.0)
Immature Granulocytes: 2 %
Lymphocytes Relative: 15 %
Lymphs Abs: 1.2 10*3/uL (ref 0.7–4.0)
MCH: 31.2 pg (ref 26.0–34.0)
MCHC: 35.5 g/dL (ref 30.0–36.0)
MCV: 87.7 fL (ref 80.0–100.0)
Monocytes Absolute: 0.9 10*3/uL (ref 0.1–1.0)
Monocytes Relative: 10 %
Neutro Abs: 6 10*3/uL (ref 1.7–7.7)
Neutrophils Relative %: 71 %
Platelet Count: 233 10*3/uL (ref 150–400)
RBC: 5.2 MIL/uL (ref 4.22–5.81)
RDW: 12.1 % (ref 11.5–15.5)
WBC Count: 8.3 10*3/uL (ref 4.0–10.5)
nRBC: 0 % (ref 0.0–0.2)

## 2020-05-22 MED ORDER — TEMOZOLOMIDE 100 MG PO CAPS: 400.0000 mg | ORAL_CAPSULE | Freq: Every day | ORAL | 0 refills | Status: DC

## 2020-05-22 MED ORDER — HYDROCORTISONE 10 MG PO TABS: 10.0000 mg | ORAL_TABLET | Freq: Two times a day (BID) | ORAL | 2 refills | Status: DC

## 2020-05-22 MED ORDER — TAMSULOSIN HCL 0.4 MG PO CAPS: 0.4000 mg | ORAL_CAPSULE | Freq: Every day | ORAL | 3 refills | Status: DC

## 2020-05-22 NOTE — Progress Notes (Signed)
[facility] at [address] [place], [place] [address] [phone]   Interval Evaluation  Date of Service: 05/22/20 Patient Name: Aaron Espinoza Patient MRN: [id number] Patient DOB: [birth year] Provider: Ventura Sellers, MD  Identifying Statement:  Aaron Espinoza is a 45 y.o. male with right thalamic anaplastic astrocytoma   Oncologic History: Oncology History  Anaplastic astrocytoma of thalamus ([place])  03/17/2019 Surgery   Stereotactic biopsy by Dr. Marcello Moores; path demonstrates anaplastic astrocytoma   04/12/2019 - 05/21/2019 Radiation Therapy   IMRT with concurrent Temodar 66m/m2 daily   06/14/2019 -  Chemotherapy    Patient is on Treatment Plan: BRAIN ANAPLASTIC GLIOMA GRADE III TEMOZOLOMIDE POST XRT Q28D        Biomarkers:  MGMT Unknown.  IDH 1/2 Wild type.  EGFR Unknown  TERT Unknown   Interval History:  Aaron ELTINGpresents to clinic for follow up now having completed cycle #11 of 5-day TMZ.  Continues to experience signficant left sided weakness, left arm and hand are still very weak and in general disuse.  He is not using the hand at all at this time.  He is now relying full time on a four pointed walker, no longer able to use the cane for ambulation. Fatigue is otherwise stable from prior. No other issues tolerating chemotherapy.  Decadron still at 232mdaily.  H+P (03/30/19) Patient presented to medical attention in late January, when he noticed mild confusion, gait imbalance.  These symptoms worsened over the next ~10 days, and were accompanied by incontinence.  Brain MRI was performed and demonstrated a right thalamic mass consistent with likely primary brain tumor.  He underwent stereotactic biopsy and ventriculostomy on 03/17/19 with Dr. ThMarcello Moores Following surgery he describes considerable improvement in most of his symptoms; at this time he is near baseline function but "very fatigued" overall.  No seizures or headaches since  surgery.  Medications: Current Outpatient Medications on File Prior to Visit  Medication Sig Dispense Refill  . acetaminophen (TYLENOL) 500 MG tablet Take 1,000 mg by mouth every 6 (six) hours as needed.    . Marland Kitchenexamethasone (DECADRON) 2 MG tablet TAKE 2 TABLETS BY MOUTH DAILY. 60 tablet 1  . docusate sodium (COLACE) 100 MG capsule Take 100 mg by mouth daily.    . Marland Kitchenbuprofen (ADVIL) 200 MG tablet Take 800 mg by mouth every 6 (six) hours as needed.    . Marland Kitchenisinopril (ZESTRIL) 10 MG tablet TAKE 1 TABLET BY MOUTH EVERY DAY 30 tablet 1  . NON FORMULARY Take 2 capsules by mouth daily. [employer] CORDYCEPS for Energy Support    . NON FORMULARY Take 2 capsules by mouth daily. [employer] BRAIN for Mental Clarity    . NON FORMULARY Take 2 capsules by mouth daily. [employer] STAMETS 7 for Daily Immune Support    . NON FORMULARY Take 2 capsules by mouth daily. [employer] TUKu[place]AIL for Immune Support    . ondansetron (ZOFRAN) 8 MG tablet Take 1 tablet (8 mg total) by mouth 2 (two) times daily as needed (nausea and vomiting). May take 30-60 minutes prior to Temodar administration if nausea/vomiting occurs. (Patient not taking: Reported on 09/20/2019) 30 tablet 1  . pantoprazole (PROTONIX) 40 MG tablet TAKE 1 TABLET BY MOUTH EVERY DAY 30 tablet 3  . polyethylene glycol (MIRALAX / GLYCOLAX) 17 g packet Take 1 packet by mouth daily.    . STOOL SOFTENER 100 MG capsule TAKE 1 TABLET BY  MOUTH 2 TIMES EVERY DAY AT BEDTIME AS NEEDED 60 tablet 2  . temozolomide (TEMODAR) 100 MG capsule Take 4 capsules (400 mg total) by mouth at bedtime. 20 capsule 0  . temozolomide (TEMODAR) 100 MG capsule TAKE 4 CAPSULES BY MOUTH 1 TIME A DAY AT BEDTIME FOR 5 DAYS OF 28 DAY CYCLE. (TOTAL DAILY DOSE OF 400MG) 20 capsule 0   No current facility-administered medications on file prior to visit.    Allergies:  Allergies  Allergen Reactions  . Gadolinium Derivatives Itching   Past Medical History:  Past Medical History:   Diagnosis Date  . BPH (benign prostatic hyperplasia) 07/20/2015  . Brain mass   . Cognitive dysfunction 01/2019  . Fatigue 01/2019  . Headache   . NF (neurofibromatosis) ([place]) 07/20/2015   NF1   Past Surgical History:  Past Surgical History:  Procedure Laterality Date  . HAND SURGERY Left    pinky - tumor benign  . LEG SURGERY Left    inner thigh tumor - benign  . VENTRICULOSTOMY N/A 03/17/2019   Procedure: ENDOSCOPIC THIRD VENTRICULOSTOMY;  Surgeon: Vallarie Mare, MD;  Location: [facility];  Service: Neurosurgery;  Laterality: N/A;  ENDOSCOPIC THIRD VENTRICULOSTOMY   Social History:  Social History   Socioeconomic History  . Marital status: Married    Spouse name: Not on file  . Number of children: Not on file  . Years of education: Not on file  . Highest education level: Not on file  Occupational History  . Not on file  Tobacco Use  . Smoking status: Former Smoker    Types: Cigars, Cigarettes  . Smokeless tobacco: Never Used  . Tobacco comment: Quit smoking cigarettes 25 yrs ago, quit cigars in 2020  Vaping Use  . Vaping Use: Never used  Substance and Sexual Activity  . Alcohol use: Yes    Alcohol/week: 6.0 - 9.0 standard drinks    Types: 6 - 9 Standard drinks or equivalent per week  . Drug use: No  . Sexual activity: Not on file  Other Topics Concern  . Not on file  Social History Narrative  . Not on file   Social Determinants of Health   Financial Resource Strain: Not on file  Food Insecurity: Not on file  Transportation Needs: Not on file  Physical Activity: Not on file  Stress: Not on file  Social Connections: Not on file  Intimate Partner Violence: Not on file   Family History:  Family History  Problem Relation Age of Onset  . Cancer Paternal Grandfather        Prostate    Review of Systems: Constitutional: swelling in face Eyes: Doesn't report blurriness of vision Ears, nose, mouth, throat, and face: Doesn't report sore throat Respiratory:  Doesn't report cough, dyspnea or wheezes Cardiovascular: Doesn't report palpitation, chest discomfort  Gastrointestinal:  Doesn't report nausea, constipation, diarrhea GU: difficulty initiating stream, nocturia Skin: Doesn't report skin rashes Neurological: Per HPI Musculoskeletal: Doesn't report joint pain Behavioral/Psych: Doesn't report anxiety  Physical Exam: Vitals:   05/22/20 1404  BP: (!) 154/77  Pulse: (!) 59  Resp: 18  Temp: 98.1 F (36.7 C)  SpO2: 100%   KPS: 60. General: cushingoid features Head: Normal EENT: No conjunctival injection or scleral icterus.  Lungs: Resp effort normal Cardiac: Regular rate Abdomen: Non-distended abdomen Skin: No rashes cyanosis or petechiae. Extremities: No clubbing or edema  Neurologic Exam: Mental Status: Awake, alert, attentive to examiner. Oriented to self and environment. Language is fluent with intact  comprehension.  Cranial Nerves: Visual acuity is grossly normal. L visual field impairment. Extra-ocular movements intact. No ptosis. Face is symmetric Motor: Tone and bulk are normal. Power is 1/5 in left arm and 3/5 in left leg. Reflexes are symmetric, no pathologic reflexes present.  Sensory: Intact to light touch Gait: Hemiparetic, not independent  Labs: I have reviewed the data as listed    Component Value Date/Time   NA 137 04/03/2020 1239   K 3.7 04/03/2020 1239   CL 106 04/03/2020 1239   CO2 23 04/03/2020 1239   GLUCOSE 105 (H) 04/03/2020 1239   BUN 18 04/03/2020 1239   CREATININE 0.85 04/03/2020 1239   CREATININE 0.83 03/04/2019 1206   CALCIUM 9.5 04/03/2020 1239   PROT 7.3 04/03/2020 1239   ALBUMIN 4.1 04/03/2020 1239   AST 11 (L) 04/03/2020 1239   ALT 15 04/03/2020 1239   ALKPHOS 62 04/03/2020 1239   BILITOT 0.8 04/03/2020 1239   GFRNONAA >60 04/03/2020 1239   GFRNONAA 106 03/04/2019 1206   GFRAA >60 10/25/2019 1209   GFRAA 123 03/04/2019 1206   Lab Results  Component Value Date   WBC 8.3 05/22/2020    NEUTROABS 6.0 05/22/2020   HGB 16.2 05/22/2020   HCT 45.6 05/22/2020   MCV 87.7 05/22/2020   PLT 233 05/22/2020    Assessment/Plan Anaplastic astrocytoma of thalamus ([facility]) [C71.0]  Mr. Kustra presents today having completed cycle #11 5-day Temozolomide.  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   We then recommended continuing with cycle #12 Temozolomide 200 mg/m2, on for five days and off for twenty three days in twenty eight day cycles. The patient will have a complete blood count performed on days 21 and 28 of each cycle, and a comprehensive metabolic panel performed on day 28 of each cycle. Labs may need to be performed more often. Zofran will prescribed for home use for nausea/vomiting.   Chemotherapy should be held for the following:  ANC less than 1,000  Platelets less than 100,000  LFT or creatinine greater than 2x ULN  If clinical concerns/contraindications develop  We will again strongly recommend home PT and OT due to motor deficits, spasticity.   Decadron should be decreased to 46m daily x10 days, then stopped.  To support this taper, will initiate hydrocortisone bridge 16mBID.  Should see PCP for dysuria, nocturia, but will initiate therapy with Flomax 0.56m47maily for now (to be adjusted, stopped, etc. by PCP)  We appreciate the opportunity to participate in the care of JasGERALD KUEHL We ask that JasBEULAH MATUSEKturn to clinic in 1 months following next brain MRI, or sooner as needed.  All questions were answered. The patient knows to call the clinic with any problems, questions or concerns. No barriers to learning were detected.  I have spent a total of 40 minutes of face-to-face and non-face-to-face time, excluding clinical staff time, preparing to see patient, ordering tests and/or medications, counseling the patient, and independently interpreting results and communicating results to the  patient/family/caregiver   ZacVentura SellersD Medical Director of Neuro-Oncology [facility] [place]/18/22 2:20 PM

## 2020-05-25 ENCOUNTER — Other Ambulatory Visit: Payer: Self-pay | Admitting: *Deleted

## 2020-05-25 DIAGNOSIS — C710 Malignant neoplasm of cerebrum, except lobes and ventricles: Secondary | ICD-10-CM

## 2020-05-25 NOTE — Progress Notes (Signed)
Unable to get response from [facility].  We have attempted without success the following agencies:  [facility]) [facility]   Wife requested we switch to outpatient neuro rehab for physical therapy.  Order placed and called referral in

## 2020-05-30 ENCOUNTER — Other Ambulatory Visit: Payer: Self-pay | Admitting: Internal Medicine

## 2020-05-30 NOTE — Telephone Encounter (Signed)
Rx refill request

## 2020-06-03 ENCOUNTER — Other Ambulatory Visit: Payer: Self-pay | Admitting: Internal Medicine

## 2020-06-05 NOTE — Telephone Encounter (Signed)
Refill Request.  

## 2020-06-07 ENCOUNTER — Other Ambulatory Visit: Payer: Self-pay

## 2020-06-07 ENCOUNTER — Ambulatory Visit: Payer: BC Managed Care – PPO | Attending: Internal Medicine | Admitting: Occupational Therapy

## 2020-06-07 ENCOUNTER — Encounter: Payer: Self-pay | Admitting: Occupational Therapy

## 2020-06-07 ENCOUNTER — Ambulatory Visit: Payer: BC Managed Care – PPO

## 2020-06-07 DIAGNOSIS — G8114 Spastic hemiplegia affecting left nondominant side: Secondary | ICD-10-CM

## 2020-06-07 DIAGNOSIS — R41842 Visuospatial deficit: Secondary | ICD-10-CM | POA: Insufficient documentation

## 2020-06-07 DIAGNOSIS — R278 Other lack of coordination: Secondary | ICD-10-CM | POA: Insufficient documentation

## 2020-06-07 DIAGNOSIS — R2681 Unsteadiness on feet: Secondary | ICD-10-CM

## 2020-06-07 DIAGNOSIS — M6281 Muscle weakness (generalized): Secondary | ICD-10-CM

## 2020-06-07 DIAGNOSIS — R208 Other disturbances of skin sensation: Secondary | ICD-10-CM | POA: Diagnosis not present

## 2020-06-07 DIAGNOSIS — R414 Neurologic neglect syndrome: Secondary | ICD-10-CM | POA: Insufficient documentation

## 2020-06-07 DIAGNOSIS — R2689 Other abnormalities of gait and mobility: Secondary | ICD-10-CM | POA: Insufficient documentation

## 2020-06-07 NOTE — Therapy (Signed)
[place]. [place], [place], [address] Phone: [phone]   Fax:  [phone]  Physical Therapy Evaluation  Patient Details  Name: Aaron Espinoza MRN: [id number] Date of Birth: May 14, 1975 Referring Provider (PT): Aaron Sellers, MD   Encounter Date: 06/07/2020   PT End of Session - 06/07/20 1659    Visit Number 1    Number of Visits 17    Date for PT Re-Evaluation 08/02/20    Authorization Type BCBS - 30 visits for year    PT Start Time 1703    PT Stop Time 1745    PT Time Calculation (min) 42 min    Equipment Utilized During Treatment Gait belt    Activity Tolerance Patient tolerated treatment well;No increased pain    Behavior During Therapy WFL for tasks assessed/performed           Past Medical History:  Diagnosis Date  . BPH (benign prostatic hyperplasia) 07/20/2015  . Brain mass   . Cognitive dysfunction 01/2019  . Fatigue 01/2019  . Headache   . NF (neurofibromatosis) ([place]) 07/20/2015   NF1    Past Surgical History:  Procedure Laterality Date  . HAND SURGERY Left    pinky - tumor benign  . LEG SURGERY Left    inner thigh tumor - benign  . VENTRICULOSTOMY N/A 03/17/2019   Procedure: ENDOSCOPIC THIRD VENTRICULOSTOMY;  Surgeon: Vallarie Mare, MD;  Location: [facility];  Service: Neurosurgery;  Laterality: N/A;  ENDOSCOPIC THIRD VENTRICULOSTOMY    There were no vitals filed for this visit.    Subjective Assessment - 06/07/20 1700    Subjective Just finishes 12/12 rounds of chemo, previously had radiation. Gets tremors on the left side when standing.  Left arm/leg. Currently using single point cane with large base for distances in home, did the driveway today. Has had multiple falls (last were 3x in 1 week around the time he did last OT eval), none as recent, using WC for outdoors. Has also fallen with 1 step into garage. Now only walking with wife behind him. Have been trying to do more  wlaking on his own with cane in home.    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    Patient Stated Goals get back to walking, be able to walk more efficiently and get more confident and improve balance.    Currently in Pain? No/denies    Pain Location Shoulder              [facility] PT Assessment - 06/07/20 1657      Assessment   Medical Diagnosis Anaplastic astrocytoma of thalamus    Referring Provider (PT) Aaron Sellers, MD    Onset Date/Surgical Date --   Jan 2021, but most decline within last 6 months   Hand Dominance Right    Next MD Visit TBD usually sees [facility] each month    Prior Therapy outpatient P.T. ([facility])      Precautions   Precautions Fall    Precaution Comments high fall risk, active CA      Home Environment   Additional Comments 2 story home, living on first floor bed and bath but would like to be able to get to second floor. to enter through garage 8" step + 6" step  to get to main level.  Lives at home with wife. has grown children who come visit. 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    Leisure did disc golf before      Sensation   Additional Comments to be further assessed      Coordination   Gross Motor Movements are Fluid and Coordinated No    Coordination and Movement Description difficulty with motor processing, decr coordination of LLE mvmts      Posture/Postural Control   Posture Comments lateral trunk lean to the left in sitting toward spastic side      Tone   Assessment Location Left Lower Extremity      ROM / Strength   AROM / PROM / Strength Strength;AROM      AROM   Overall AROM Comments L knee ROM WFL some  clonus at ankle      Strength   Overall Strength Comments RLE grossly 4+/5. LLE hip flex 2+/5, Knee ext 2+/5, knee flex to 90 deg 2+/5      Flexibility   Soft Tissue Assessment /Muscle Length --   grossly WNL other than some end range tightness sensations, moreso affected by incr mms tone.     Transfers   Transfers Sit to Stand;Stand to [employer] Transfers    Sit to Stand 4: Min assist    Sit to Stand Details Tactile cues for sequencing;Tactile cues for placement    Five time sit to stand comments  with min A and RUE support x 5 = 2 min    Stand to Sit 4: Min assist;With upper extremity assist;With armrests    Stand to Sit Details (indicate cue type and reason) Tactile cues for sequencing;Tactile cues for weight shifting;Tactile cues for placement;Manual facilitation for weight shifting    Stand [facility] Transfers 4: Min assist   with large base single point cane   Stand Pivot Transfer Details (indicate cue type and reason) alot of difficulty noted with sequencing foot placement for turn requiring mod to max cues and guidance for safe sitting into WC from standing.      Ambulation/Gait   Ambulation/Gait Yes    Ambulation/Gait Assistance 4: Min guard   alot of compensations, close guarding, slow asymmetrical step length, increased WB RLE >> LLE, with large based single point cane. Trialed with hemiwalker with much difficulty sequencing. clonus kicked in with LLE weightbearing,   Ambulation Distance (Feet) 8 Feet    Ambulation Surface Level;Indoor      Wheelchair Mobility   Wheelchair [employer] --   able to lock/unlock right side, max assist for left side. Unable to propel self forward     Balance   Balance Assessed Yes      Static Sitting Balance   Static Sitting - Balance Support Right upper extremity supported;Feet supported   grossly WNL with some posterior trunk lean but no LOB.     Static Standing Balance   Static Standing - Balance Support Right upper extremity  supported      Dynamic Standing Balance   Dynamic Standing - Balance Support Right upper extremity supported;During functional activity   CGA to min A with short mvmts     LLE Tone   LLE Tone Hypertonic;Modified Ashworth      LLE Tone  Hypertonic Details adductor spasticity which limited ability to get appropriate step width.    Modified Ashworth Scale for Grading Hypertonia LLE More marked increase in muscle tone through most of the ROM, but affected part(s) easily moved   for hip and knee flex >ext. left ankle 3 with clonus                     Objective measurements completed on examination: See above findings.                 PT Short Term Goals - 06/07/20 1659      PT SHORT TERM GOAL #1   Title Independent with initial HEP with caregiver assist    Time 2    Period Weeks    Status New    Target Date 06/21/20      PT SHORT TERM GOAL #2   Title Pt will demonstrate improved independent anterior weight shift for improved ease with initiation of sit to stand transfers    Time 4    Period Weeks    Status New    Target Date 07/05/20      PT SHORT TERM GOAL #3   Title Pt to be able to complete TUG and attain initial score.    Time 2    Period Weeks    Status New    Target Date 06/21/20             PT Long Term Goals - 06/07/20 1700      PT LONG TERM GOAL #1   Title Independent with advanced HEP with caregiver assist    Time 8    Period Weeks    Status New    Target Date 08/02/20      PT LONG TERM GOAL #2   Title Pt will be able to scoot forward and laterally in sitting to assist with repositioning, pressure releife for prolonged sitting, and assist with ease of transfers.    Time 8    Period Weeks    Status New    Target Date 08/02/20      PT LONG TERM GOAL #3   Title Pt will demonstrate improved functional strength of LLE to at least 3+/5 to demonstrate improved active isolated movement.    Time 8    Period Weeks    Status New     Target Date 08/02/20      PT LONG TERM GOAL #4   Title Pt will be able to ambulate 100 ft with LRAD, no greater than CS, and without LOB  to facilitate safety with negotiation of home and community environments.    Time 8    Period Weeks    Target Date 08/02/20                  Plan - 06/07/20 1704    Clinical Impression Statement Pt is 45 yo male with anaplastic astrocytoma of the thalamus who recently completed his 12th and final round of chemotherapy. He currently presents with spasticity along the LUE and LLE with more of an extensor synergy (stronger hip ADD and ankle PF/inversion tone) with clonus that effects weightbearing on the LLE in standing. Despite increased mms tone LLE ROM is grossly WNL without any contractures at this time.  Furthermore, he demonstrates general mms weakness (LLE>RLE), decreased postural control, decreased processing speed, decr coordination and abnormal gait. He is CGA to min A with sit to stand transfers although with RUE assistance  in addition to multiple attempts required to complete due to inefficient sequencing and weight shifting. Gait with AD is also slow, asymmetrical and unsteady with limtied WB on the LLE, and increased clonus with more weightbearing on the left with each step. Aaron Espinoza would benefit form skilled physical therapy at this time to address aforementioned deficits and improve independence, efficiency, and safety with functional mobility, gait, strength and balance.    Stability/Clinical Decision Making Unstable/Unpredictable    Clinical Decision Making High    Rehab Potential Fair    PT Frequency 2x / week   (1-2 x/wk)   PT Duration 8 weeks    PT Treatment/Interventions ADLs/Self Care Home Management;Electrical Stimulation;DME Instruction;Gait training;Neuromuscular re-education;Therapeutic exercise;Therapeutic activities;Functional mobility training;Patient/family education;Orthotic Fit/Training;Manual techniques;Energy  conservation;Taping;Vestibular;Cryotherapy;Moist Heat    PT Next Visit Plan Plan to get a TUG score and  provide/ spend time reviewing an HEP with pt and caregiver next session. BLE strengthening, balance and transfer training, gait training. May benefit from trial of AFO to improve ankle stability on the left.    PT Home Exercise Plan to be provided next visit formally. Discussed verbally seated march and LAQ today    Consulted and Agree with Plan of Care Patient;Family member/caregiver    Family Member Consulted Wife, Aaron Espinoza           Patient will benefit from skilled therapeutic intervention in order to improve the following deficits and impairments:  Abnormal gait,Decreased coordination,Difficulty walking,Pain,Impaired UE functional use,Increased muscle spasms,Impaired tone,Decreased balance,Decreased mobility,Decreased strength,Increased edema,Impaired sensation,Postural dysfunction,Decreased activity tolerance,Decreased knowledge of use of DME  Visit Diagnosis: Spastic hemiplegia affecting left nondominant side, unspecified etiology ([place]) - Plan: PT plan of care cert/re-cert  Unsteadiness on feet - Plan: PT plan of care cert/re-cert  Other lack of coordination - Plan: PT plan of care cert/re-cert  Muscle weakness (generalized) - Plan: PT plan of care cert/re-cert     Problem List Patient Active Problem List   Diagnosis Date Noted  . Right arm pain 07/27/2019  . Anaplastic astrocytoma of thalamus ([place]) 03/17/2019  . NF (neurofibromatosis) ([place]) 07/20/2015  . BPH (benign prostatic hyperplasia) 07/20/2015  . ED (erectile dysfunction) 07/20/2015  . Seborrheic keratoses 07/20/2015  . Cherry angioma 07/20/2015    Hall Busing, PT, DPT 06/07/2020, 6:33 PM  [place]. [place], [place], [address] Phone: [phone]   Fax:  [phone]  Name: Aaron Espinoza MRN: [id number] Date of Birth: Jun 17, 1975

## 2020-06-08 NOTE — Therapy (Signed)
[facility]. [place], [place], [address] Phone: [phone]   Fax:  [phone]  Occupational Therapy Evaluation  Patient Details  Name: Aaron Espinoza MRN: [id number] Date of Birth: December 15, 1975 Referring Provider (OT): Dr. Arlan Organ   Encounter Date: 06/07/2020   OT End of Session - 06/07/20 1645    Visit Number 1    Number of Visits 17    Date for OT Re-Evaluation 09/05/20    Authorization Type BCBS    Progress Note Due on Visit 10    OT Start Time 1620   pt arrived late   OT Stop Time 1703    OT Time Calculation (min) 43 min    Activity Tolerance Patient tolerated treatment well    Behavior During Therapy [facility] for tasks assessed/performed           Past Medical History:  Diagnosis Date  . BPH (benign prostatic hyperplasia) 07/20/2015  . Brain mass   . Cognitive dysfunction 01/2019  . Fatigue 01/2019  . Headache   . NF (neurofibromatosis) ([place]) 07/20/2015   NF1    Past Surgical History:  Procedure Laterality Date  . HAND SURGERY Left    pinky - tumor benign  . LEG SURGERY Left    inner thigh tumor - benign  . VENTRICULOSTOMY N/A 03/17/2019   Procedure: ENDOSCOPIC THIRD VENTRICULOSTOMY;  Surgeon: Vallarie Mare, MD;  Location: [facility];  Service: Neurosurgery;  Laterality: N/A;  ENDOSCOPIC THIRD VENTRICULOSTOMY    There were no vitals filed for this visit.   Subjective Assessment - 06/07/20 1632    Subjective  Pt arrived to session today in a manual w/c with his wife, Aaron Espinoza. Pt states he received current diagnosis in Jan 2021, after which he stopped working, but has noticed the most significant decline in function within the past ~6 months. Per pt and spouse report, pt received an evaluation for OT services at [facility] in late February but did not receive a PT evaluation, eventually attempting to receive [facility] services instead. Due to difficulties/limitations w/ insurance, pt requested a  new referral for OP services and report they are willing and able to make the commute to receive whatever services will help him get stronger and become more independent.    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 Yes    Pain Score 3     Pain Location Shoulder    Pain Orientation Left    Pain Descriptors / Indicators Tender    Pain Onset More than a month ago    Pain Frequency Intermittent             OT Education - 06/07/20 1645    Education Details Education provided on role and purpose of OT, as well as potential interventions and goals for therapy. Initiated HEP w/ gentle stretches for elbow, wrist, and fingers.    Person(s) Educated Patient;Spouse   Denise   Methods Explanation;Handout    Comprehension Verbalized understanding             OT Short Term Goals - 06/07/20 1655      OT SHORT TERM GOAL #1   Title  Pt and spouse will be independent w/ initial HEP for LUE (self ROM/stretches)    Baseline No HEP at this time    Time 4    Period Weeks    Status New    Target Date 07/05/20      OT SHORT TERM GOAL #2   Title Pt will be able to don overhead/open-front shirt w/ Mod A    Baseline Dependent w/ UB dressing per pt report    Time 4    Period Weeks    Status New      OT SHORT TERM GOAL #3   Title Pt to verbalize understanding of compensatory strategies, including A/E prn, to increase independence with cutting food and dressing (rocker knife, shoe buttons, etc)    Baseline Decreased knowledge of compensatory strategies    Time 4    Period Weeks    Status New      OT SHORT TERM GOAL #4   Title Pt to be independent with wear and care of L hand orthosis prn    Baseline No  braces/orthoses at this time    Time 4    Period Weeks    Status New      OT SHORT TERM GOAL #5   Title Pt to verbalize understanding of visual scanning strategies and safety considerations due to lack of sensation L side    Baseline Decreaseed knowledge of safety strategies    Time 4    Period Weeks    Status New             OT Long Term Goals - 06/07/20 1655      OT LONG TERM GOAL #1   Title Pt will be able to complete UB/LB dressing w/ Min A    Baseline Per pt report, Dep w/ dressing    Time 8    Period Weeks    Status New    Target Date 08/02/20      OT LONG TERM GOAL #2   Title Pt to demo use of LUE as gross assist for bilateral tasks and ADLs    Time 8    Period Weeks    Status New      OT LONG TERM GOAL #3   Title Pt to perform simple snack/meal prep w/ necessary DME/compensatory strategies prn at distant SPV level    Baseline Unable to complete meal prep tasks at this time    Time 8    Period Weeks    Status New      OT LONG TERM GOAL #4   Title Pt to attend to L side of body with transfers, functional ambulation, and scanning activities at least 75% of the time    Baseline L-sided inattention/visual field cut    Time 8    Period Weeks    Status New      OT LONG TERM GOAL #5   Title Pt to perform gross finger extension to 75% or greater to facilitate release of cylindrical objects    Baseline Decreased finger extension    Time 8    Period Weeks    Status New      OT LONG TERM GOAL #6   Title Pt to demo AROM of shoulder flexion to 40* for low level reaching during ADLs    Baseline No active L shoulder flexion at this time    Time 8    Period Weeks    Status New  Plan - 06/07/20 1700    Clinical Impression Statement Pt is a 45 y.o. male who presents to OP OT w/ anaplastic astrocytoma of thalamus diagnosed late January 2021. Pt currently lives with his [name]) in a multi-level home and presents this session with L-sided  (non-dominant) hemiparesis, decreased balance, altered sensation including proprioception, apraxia, and L visual field cut. Pt will benefit from skilled occupational therapy services to address strength and coordination, ROM, pain management, sensation, compensatory strategies, and introduction of A/E to improve independence, quality of life, and participation and safety during ADLs and IADLs.    OT Occupational Profile and History Detailed Assessment- Review of Records and additional review of physical, cognitive, psychosocial history related to current functional performance    Occupational performance deficits (Please refer to evaluation for details): ADL's;IADL's;Leisure;Social Participation    Body Structure / Function / Physical Skills ADL;Decreased knowledge of use of DME;Strength;Dexterity;GMC;Balance;Tone;UE functional use;Proprioception;Body mechanics;IADL;ROM;Vision;Mobility;Coordination;FMC;Muscle spasms;Pain;Sensation    Cognitive Skills Attention;Perception;Safety Awareness    Rehab Potential Fair   given diagnosis   Clinical Decision Making Multiple treatment options, significant modification of task necessary    Comorbidities Affecting Occupational Performance: May have comorbidities impacting occupational performance    Modification or Assistance to Complete Evaluation  Min-Moderate modification of tasks or assist with assess necessary to complete eval    OT Frequency 2x / week   May reduce frequency to 1x/week after first few weeks due to insurance VL   OT Duration 8 weeks    OT Treatment/Interventions Self-care/ADL training;Moist Heat;DME and/or AE instruction;Splinting;Therapeutic activities;Therapeutic exercise;Cognitive remediation/compensation;Coping strategies training;Neuromuscular education;Functional Mobility Training;Passive range of motion;Visual/perceptual remediation/compensation;Energy conservation;Manual Therapy;Patient/family education;Electrical Stimulation;Aquatic  Therapy;Balance training    Plan Review all goals w/ pt; assess for resting hand orthosis    Consulted and Agree with Plan of Care Patient;Family member/caregiver    Family Member Consulted [name])           Patient will benefit from skilled therapeutic intervention in order to improve the following deficits and impairments:   Body Structure / Function / Physical Skills: ADL,Decreased knowledge of use of DME,Strength,Dexterity,GMC,Balance,Tone,UE functional use,Proprioception,Body mechanics,IADL,ROM,Vision,Mobility,Coordination,FMC,Muscle spasms,Pain,Sensation Cognitive Skills: Attention,Perception,Safety Awareness     Visit Diagnosis: Spastic hemiplegia affecting left nondominant side, unspecified etiology ([place])  Other lack of coordination  Visuospatial deficit  Neurological neglect syndrome  Other disturbances of skin sensation  Other abnormalities of gait and mobility    Problem List Patient Active Problem List   Diagnosis Date Noted  . Right arm pain 07/27/2019  . Anaplastic astrocytoma of thalamus ([place]) 03/17/2019  . NF (neurofibromatosis) ([place]) 07/20/2015  . BPH (benign prostatic hyperplasia) 07/20/2015  . ED (erectile dysfunction) 07/20/2015  . Seborrheic keratoses 07/20/2015  . [place] angioma 07/20/2015     Kathrine Cords, OTR/L, MSOT 06/07/2020, 5:00 PM  [facility]. [place], [place], [address] Phone: [phone]   Fax:  [phone]  Name: SHAHZAD THOMANN MRN: [id number] Date of Birth: [birth year]

## 2020-06-12 ENCOUNTER — Other Ambulatory Visit: Payer: Self-pay | Admitting: Radiation Therapy

## 2020-06-13 ENCOUNTER — Other Ambulatory Visit: Payer: Self-pay | Admitting: Internal Medicine

## 2020-06-13 NOTE — Telephone Encounter (Signed)
90 refill request

## 2020-06-14 ENCOUNTER — Other Ambulatory Visit: Payer: Self-pay

## 2020-06-14 ENCOUNTER — Ambulatory Visit: Payer: BC Managed Care – PPO | Admitting: Physical Therapy

## 2020-06-14 ENCOUNTER — Encounter: Payer: Self-pay | Admitting: Physical Therapy

## 2020-06-14 ENCOUNTER — Ambulatory Visit: Payer: BC Managed Care – PPO | Admitting: Occupational Therapy

## 2020-06-14 ENCOUNTER — Encounter: Payer: Self-pay | Admitting: Occupational Therapy

## 2020-06-14 DIAGNOSIS — R2681 Unsteadiness on feet: Secondary | ICD-10-CM

## 2020-06-14 DIAGNOSIS — R41842 Visuospatial deficit: Secondary | ICD-10-CM

## 2020-06-14 DIAGNOSIS — G8114 Spastic hemiplegia affecting left nondominant side: Secondary | ICD-10-CM | POA: Diagnosis not present

## 2020-06-14 DIAGNOSIS — M6281 Muscle weakness (generalized): Secondary | ICD-10-CM

## 2020-06-14 DIAGNOSIS — R278 Other lack of coordination: Secondary | ICD-10-CM

## 2020-06-14 DIAGNOSIS — R2689 Other abnormalities of gait and mobility: Secondary | ICD-10-CM

## 2020-06-14 DIAGNOSIS — R414 Neurologic neglect syndrome: Secondary | ICD-10-CM | POA: Diagnosis not present

## 2020-06-14 DIAGNOSIS — R208 Other disturbances of skin sensation: Secondary | ICD-10-CM | POA: Diagnosis not present

## 2020-06-14 NOTE — Patient Instructions (Signed)
Access Code: [email or web address] URL: [email or web address] Date: 06/14/2020 Prepared by: Amador Cunas  Exercises Seated March - 1 x daily - 7 x weekly - 2 sets - 10 reps Seated Long Arc Quad - 1 x daily - 7 x weekly - 2 sets - 10 reps - 1-2 sec hold Seated Hamstring Curl with Anchored Resistance - 1 x daily - 7 x weekly - 2 sets - 10 reps Seated Heel Slide - 1 x daily - 7 x weekly - 2 sets - 10 reps Seated Hip Abduction with Resistance - 1 x daily - 7 x weekly - 2 sets - 10 reps Seated Hip Adduction Squeeze with Ball - 1 x daily - 7 x weekly - 2 sets - 10 reps - 3 sec hold

## 2020-06-14 NOTE — Therapy (Signed)
[place] [place], [place], [address] Phone: [phone]   Fax:  [phone]  Physical Therapy Treatment  Patient Details  Name: Aaron Espinoza MRN: [id number] Date of Birth: 04-13-1975 Referring Provider (PT): Ventura Sellers, MD   Encounter Date: 06/14/2020   PT End of Session - 06/14/20 1543    Visit Number 2    Date for PT Re-Evaluation 08/02/20    Authorization Type BC[employer] - 30 visits for year    PT Start Time 1451    PT Stop Time 1530    PT Time Calculation (min) 39 min    Equipment Utilized During Treatment Gait belt    Activity Tolerance Patient tolerated treatment well;No increased pain    Behavior During Therapy WFL for tasks assessed/performed           Past Medical History:  Diagnosis Date  . BPH (benign prostatic hyperplasia) 07/20/2015  . Brain mass   . Cognitive dysfunction 01/2019  . Fatigue 01/2019  . Headache   . NF (neurofibromatosis) ([place]) 07/20/2015   NF1    Past Surgical History:  Procedure Laterality Date  . HAND SURGERY Left    pinky - tumor benign  . LEG SURGERY Left    inner thigh tumor - benign  . VENTRICULOSTOMY N/A 03/17/2019   Procedure: ENDOSCOPIC THIRD VENTRICULOSTOMY;  Surgeon: Vallarie Mare, MD;  Location: [facility];  Service: Neurosurgery;  Laterality: N/A;  ENDOSCOPIC THIRD VENTRICULOSTOMY    There were no vitals filed for this visit.   Subjective Assessment - 06/14/20 1452    Subjective Pt reports no new changes since last rx. Has been doing seated leg exercises.    Currently in Pain? No/denies                             [facility] Adult PT Treatment/Exercise - 06/14/20 0001      Transfers   Transfers Sit to Stand;Stand to Sit    Sit to Stand 4: Min guard;4: Min assist    Sit to Stand Details Tactile cues for sequencing;Tactile cues for placement    Five time sit to stand comments  with CGA-minA; mod cuing required to avoid pushing back  on WC    Stand to Sit 4: Min guard    Stand to Sit Details (indicate cue type and reason) Tactile cues for sequencing;Tactile cues for weight shifting;Tactile cues for placement;Manual facilitation for weight shifting      Ambulation/Gait   Gait Comments ambulation x10 ft with SPC and CGA; pt utilizes verbal "cane, 1, 2" for sequencing      Exercises   Exercises Knee/Hip;Lumbar      Lumbar Exercises: Seated   Other Seated Lumbar Exercises cues for upright sitting, core activation, reaching outside of BOS multidirectionally x10    Other Seated Lumbar Exercises STS x5 with cues for RLE foot placement along with anterior weight shift      Knee/Hip Exercises: Seated   Long Arc Quad Both;2 sets;10 reps    Long Arc Quad Limitations very limited movement LLE with hip flexor compensation    Heel Slides Left;2 sets;5 reps    Ball Squeeze x15 3 sec hold    Clamshell with TheraBand Red   x15   Marching Both;2 sets;10 reps    Hamstring Curl Right;2 sets;10 reps    Hamstring Limitations red TB  PT Short Term Goals - 06/07/20 1659      PT SHORT TERM GOAL #1   Title Independent with initial HEP with caregiver assist    Time 2    Period Weeks    Status New    Target Date 06/21/20      PT SHORT TERM GOAL #2   Title Pt will demonstrate improved independent anterior weight shift for improved ease with initiation of sit to stand transfers    Time 4    Period Weeks    Status New    Target Date 07/05/20      PT SHORT TERM GOAL #3   Title Pt to be able to complete TUG and attain initial score.    Time 2    Period Weeks    Status New    Target Date 06/21/20             PT Long Term Goals - 06/07/20 1700      PT LONG TERM GOAL #1   Title Independent with advanced HEP with caregiver assist    Time 8    Period Weeks    Status New    Target Date 08/02/20      PT LONG TERM GOAL #2   Title Pt will be able to scoot forward and laterally in sitting to  assist with repositioning, pressure releife for prolonged sitting, and assist with ease of transfers.    Time 8    Period Weeks    Status New    Target Date 08/02/20      PT LONG TERM GOAL #3   Title Pt will demonstrate improved functional strength of LLE to at least 3+/5 to demonstrate improved active isolated movement.    Time 8    Period Weeks    Status New    Target Date 08/02/20      PT LONG TERM GOAL #4   Title Pt will be able to ambulate 100 ft with LRAD, no greater than CS, and without LOB  to facilitate safety with negotiation of home and community environments.    Time 8    Period Weeks    Target Date 08/02/20                 Plan - 06/14/20 1548    Clinical Impression Statement Pt tolerated progression to TE well. Able to perform STS with CGA-minA but had OT holding WC d/t strong pushback on chair when rising to stand. Mod cuing for LE placement and sequencing with decreased pushback into WC and mild improved anterior weight shift. Does demo L extensor synergy in standing and clonus with LLE WB. Gait x10 ft with CGA and pt utilizing verbal cuing for sequencing. Did not try sequencing turns today but pt wife states this is a difficulty at home. Prescribed seated HEP with pt demo understanding. Further education on process for getting AFO/brace and will follow up with this in future sessions.    PT Treatment/Interventions ADLs/Self Care Home Management;Electrical Stimulation;DME Instruction;Gait training;Neuromuscular re-education;Therapeutic exercise;Therapeutic activities;Functional mobility training;Patient/family education;Orthotic Fit/Training;Manual techniques;Energy conservation;Taping;Vestibular;Cryotherapy;Moist Heat    PT Next Visit Plan Plan to get a TUG score. BLE strengthening, balance and transfer training, gait training. May benefit from trial of AFO to improve ankle stability on the left.    Consulted and Agree with Plan of Care Patient;Family member/caregiver     Family Member Consulted Wife, [name]           Patient will benefit from skilled  therapeutic intervention in order to improve the following deficits and impairments:  Abnormal gait,Decreased coordination,Difficulty walking,Pain,Impaired UE functional use,Increased muscle spasms,Impaired tone,Decreased balance,Decreased mobility,Decreased strength,Increased edema,Impaired sensation,Postural dysfunction,Decreased activity tolerance,Decreased knowledge of use of DME  Visit Diagnosis: Spastic hemiplegia affecting left nondominant side, unspecified etiology ([place])  Unsteadiness on feet  Other lack of coordination  Muscle weakness (generalized)  Other abnormalities of gait and mobility     Problem List Patient Active Problem List   Diagnosis Date Noted  . Right arm pain 07/27/2019  . 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 NF (neurofibromatosis) ([place]) 07/20/2015  . BPH (benign prostatic hyperplasia) 07/20/2015  . ED (erectile dysfunction) 07/20/2015  . Seborrheic keratoses 07/20/2015  . Cherry angioma 07/20/2015   Amador Cunas, PT, DPT Donald Prose Shakima Nisley 06/14/2020, 3:56 PM  [place]. [place], [place], [address] Phone: [phone]   Fax:  [phone]  Name: Aaron Espinoza MRN: [id number] Date of Birth: 22-Apr-1975

## 2020-06-15 NOTE — Therapy (Signed)
[place]. [place], [place], [address] Phone: [phone]   Fax:  [phone]  Occupational Therapy Treatment  Patient Details  Name: Aaron Espinoza MRN: [id number] Date of Birth: Sep 11, 1975 Referring Provider (OT): Dr. Arlan Organ   Encounter Date: 06/14/2020   OT End of Session - 06/14/20 1559    Visit Number 2    Number of Visits 17    Date for OT Re-Evaluation 09/05/20    Authorization Type BCBS    Progress Note Due on Visit 10    OT Start Time 1530    OT Stop Time 1615    OT Time Calculation (min) 45 min    Activity Tolerance Patient tolerated treatment well    Behavior During Therapy [facility] for tasks assessed/performed           Past Medical History:  Diagnosis Date  . BPH (benign prostatic hyperplasia) 07/20/2015  . Brain mass   . Cognitive dysfunction 01/2019  . Fatigue 01/2019  . Headache   . NF (neurofibromatosis) ([place]) 07/20/2015   NF1    Past Surgical History:  Procedure Laterality Date  . HAND SURGERY Left    pinky - tumor benign  . LEG SURGERY Left    inner thigh tumor - benign  . VENTRICULOSTOMY N/A 03/17/2019   Procedure: ENDOSCOPIC THIRD VENTRICULOSTOMY;  Surgeon: Vallarie Mare, MD;  Location: [facility];  Service: Neurosurgery;  Laterality: N/A;  ENDOSCOPIC THIRD VENTRICULOSTOMY    There were no vitals filed for this visit.   Subjective Assessment - 06/14/20 1557    Subjective  Pt states he feels a little tired, but is otherwise feeling okay    Patient is accompanied by: Family member   mother   Pertinent History anaplastic astrocytoma of thalamus (diagnosed January 2021)    Limitations high fall risk, active CA (Chemo treatment 1x/month for 5 days)    Patient Stated Goals Regain LUE functional use, work on walking and being more independent w/ ADLs    Currently in Pain? No/denies             OT Treatments/Exercises (OP) - 06/14/20 1600      ADLs   UB Dressing OT  demo'd and explained single-arm donning of pullover shirt; pt verbalized understanding and returned demo of threading BUEs, requiring Min A w/ extended processing time to problem-solve threading LUE and 1 verbal cue to then thread RUE; able to doff pullover shirt w/ Mod I, per pt report    LB Dressing OT demo'd and explained single-arm strategy using reacher to don/doff bottoms. Pt required Max A to don, w/ OT helping to coordinate pulling reacher w/ shorts hooked while also manipulating LLE during threading; Min A to doff, using reacher for LLE    Toileting OT problem-solved w/ pt to determine safe alternative methods for clothing manipulation during toileting, particularly to pull up bottoms when standing             OT Short Term Goals - 06/14/20 1545      OT SHORT TERM GOAL #1   Title Pt and spouse will be independent w/ initial HEP for LUE (self ROM/stretches)    Baseline No HEP at this time    Time 4    Period Weeks    Status On-going    Target Date 07/05/20      OT SHORT TERM GOAL #2   Title Pt will be able to don overhead/open-front shirt w/ Mod A  Baseline Dependent w/ UB dressing per pt report    Time 4    Period Weeks    Status On-going      OT SHORT TERM GOAL #3   Title Pt to verbalize understanding of compensatory strategies, including A/E prn, to increase independence with cutting food and dressing (rocker knife, shoe buttons, etc)    Baseline Decreased knowledge of compensatory strategies    Time 4    Period Weeks    Status On-going      OT SHORT TERM GOAL #4   Title Pt to be independent with wear and care of L hand orthosis prn    Baseline No braces/orthoses at this time    Time 4    Period Weeks    Status On-going      OT SHORT TERM GOAL #5   Title Pt to verbalize understanding of visual scanning strategies and safety considerations due to lack of sensation L side    Baseline Decreaseed knowledge of safety strategies    Time 4    Period Weeks    Status  On-going             OT Long Term Goals - 06/14/20 1545      OT LONG TERM GOAL #1   Title Pt will be able to complete UB/LB dressing w/ Min A    Baseline Per pt report, Dep w/ dressing    Time 8    Period Weeks    Status On-going      OT LONG TERM GOAL #2   Title Pt to demo use of LUE as gross assist for bilateral tasks and ADLs    Time 8    Period Weeks    Status On-going      OT LONG TERM GOAL #3   Title Pt to perform simple snack/meal prep w/ necessary DME/compensatory strategies prn at distant SPV level    Baseline Unable to complete meal prep tasks at this time    Time 8    Period Weeks    Status On-going      OT LONG TERM GOAL #4   Title Pt to attend to L side of body with transfers, functional ambulation, and scanning activities at least 75% of the time    Baseline L-sided inattention/visual field cut    Time 8    Period Weeks    Status On-going      OT LONG TERM GOAL #5   Title Pt to perform gross finger extension to 75% or greater to facilitate release of cylindrical objects    Baseline Decreased finger extension    Time 8    Period Weeks    Status On-going      OT LONG TERM GOAL #6   Title Pt to demo AROM of shoulder flexion to 40* for low level reaching during ADLs    Baseline No active L shoulder flexion at this time    Time 8    Period Weeks    Status On-going             Plan - 06/14/20 1600    Clinical Impression Statement OT reviewed all goals w/ pt and introduced compensatory strategies for UB and LB dressing this session. Pt was able to demo threading bilateral sleeves of overhead shirt w/ Min A, requiring assist to problem-solve how to thread LUE. Pt also demo'd difficulty threading LLE into shorts using reacher due to decreased knee extension, requiring Max A. Pt exhibited decreased  motor planning during functional activities, needing assist for coordination and problem-solving. Pt's spouse also reports they purchased a TTB and that it feels  much safer.    OT Occupational Profile and History Detailed Assessment- Review of Records and additional review of physical, cognitive, psychosocial history related to current functional performance    Occupational performance deficits (Please refer to evaluation for details): ADL's;IADL's;Leisure;Social Participation    Body Structure / Function / Physical Skills ADL;Decreased knowledge of use of DME;Strength;Dexterity;GMC;Balance;Tone;UE functional use;Proprioception;Body mechanics;IADL;ROM;Vision;Mobility;Coordination;FMC;Muscle spasms;Pain;Sensation    Cognitive Skills Attention;Perception;Safety Awareness    Rehab Potential Fair   given diagnosis   Clinical Decision Making Multiple treatment options, significant modification of task necessary    Comorbidities Affecting Occupational Performance: May have comorbidities impacting occupational performance    Modification or Assistance to Complete Evaluation  Min-Moderate modification of tasks or assist with assess necessary to complete eval    OT Frequency 2x / week   May reduce frequency to 1x/week after first few weeks due to insurance VL   OT Duration 8 weeks    OT Treatment/Interventions Self-care/ADL training;Moist Heat;DME and/or AE instruction;Splinting;Therapeutic activities;Therapeutic exercise;Cognitive remediation/compensation;Coping strategies training;Neuromuscular education;Functional Mobility Training;Passive range of motion;Visual/perceptual remediation/compensation;Energy conservation;Manual Therapy;Patient/family education;Electrical Stimulation;Aquatic Therapy;Balance training    Plan LUE stretches; assess for resting hand orthosis?    Consulted and Agree with Plan of Care Patient;Family member/caregiver    Family Member Consulted Wife [name])           Patient will benefit from skilled therapeutic intervention in order to improve the following deficits and impairments:   Body Structure / Function / Physical Skills:  ADL,Decreased knowledge of use of DME,Strength,Dexterity,GMC,Balance,Tone,UE functional use,Proprioception,Body mechanics,IADL,ROM,Vision,Mobility,Coordination,FMC,Muscle spasms,Pain,Sensation Cognitive Skills: Attention,Perception,Safety Awareness     Visit Diagnosis: Spastic hemiplegia affecting left nondominant side, unspecified etiology ([place])  Other lack of coordination  Muscle weakness (generalized)  Visuospatial deficit    Problem List Patient Active Problem List   Diagnosis Date Noted  . Right arm pain 07/27/2019  . 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 NF (neurofibromatosis) ([place]) 07/20/2015  . BPH (benign prostatic hyperplasia) 07/20/2015  . ED (erectile dysfunction) 07/20/2015  . Seborrheic keratoses 07/20/2015  . Cherry angioma 07/20/2015     Kathrine Cords, OTR/L, MSOT 06/15/2020, 5:41 PM  [place]. [place], [place], [address] Phone: [phone]   Fax:  [phone]  Name: ROLANDO HESSLING MRN: [id number] Date of Birth: [birth year]

## 2020-06-16 ENCOUNTER — Ambulatory Visit: Payer: BC Managed Care – PPO

## 2020-06-16 ENCOUNTER — Ambulatory Visit (HOSPITAL_COMMUNITY)
Admission: RE | Admit: 2020-06-16 | Discharge: 2020-06-16 | Disposition: A | Discharge status: Home / Self Care | Payer: BC Managed Care – PPO | Source: Ambulatory Visit | Attending: Internal Medicine | Admitting: Internal Medicine

## 2020-06-16 ENCOUNTER — Other Ambulatory Visit: Payer: Self-pay

## 2020-06-16 ENCOUNTER — Ambulatory Visit: Payer: BC Managed Care – PPO | Admitting: Occupational Therapy

## 2020-06-16 DIAGNOSIS — R2689 Other abnormalities of gait and mobility: Secondary | ICD-10-CM | POA: Diagnosis not present

## 2020-06-16 DIAGNOSIS — G8114 Spastic hemiplegia affecting left nondominant side: Secondary | ICD-10-CM

## 2020-06-16 DIAGNOSIS — R278 Other lack of coordination: Secondary | ICD-10-CM | POA: Diagnosis not present

## 2020-06-16 DIAGNOSIS — R208 Other disturbances of skin sensation: Secondary | ICD-10-CM | POA: Diagnosis not present

## 2020-06-16 DIAGNOSIS — R41842 Visuospatial deficit: Secondary | ICD-10-CM | POA: Diagnosis not present

## 2020-06-16 DIAGNOSIS — R414 Neurologic neglect syndrome: Secondary | ICD-10-CM | POA: Diagnosis not present

## 2020-06-16 DIAGNOSIS — M6281 Muscle weakness (generalized): Secondary | ICD-10-CM

## 2020-06-16 DIAGNOSIS — C710 Malignant neoplasm of cerebrum, except lobes and ventricles: Secondary | ICD-10-CM | POA: Insufficient documentation

## 2020-06-16 DIAGNOSIS — R2681 Unsteadiness on feet: Secondary | ICD-10-CM | POA: Diagnosis not present

## 2020-06-16 IMAGING — MR MR HEAD WO/W CM
13 series · 48 of 48 positions shown · IV contrast (gadavist)
Comparison: Prior brain MRI examinations [DATE] and earlier.

CLINICAL DATA: Anaplastic astrocytoma of thalamus. Brain/CNS
neoplasm, assess treatment response.

EXAM:
MRI HEAD WITHOUT AND WITH CONTRAST
TECHNIQUE: Multiplanar, multiecho pulse sequences of the brain and surrounding
structures were obtained without and with intravenous contrast.
CONTRAST:  9mL GADAVIST GADOBUTROL 1 MMOL/ML IV SOLN. Please note
the patient was pre-medicated prior to contrast administration.

[Series 5: DWI · axial · 3.0mm · 1.36mm/px · z∈[-51,+89]mm · 6 of 96 slices shown (1 of 2)]
[im 1/96]
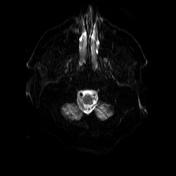
[im 20/96]
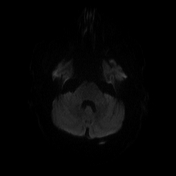
[im 39/96]
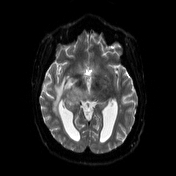
[im 58/96]
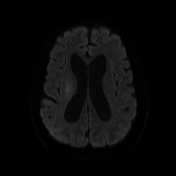
[im 77/96]
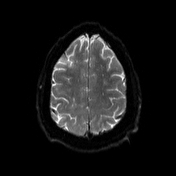
[im 96/96]
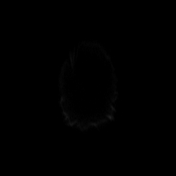

[Series 6: DWI · axial · 3.0mm · 1.36mm/px · z∈[-51,+89]mm · 3 of 48 slices shown (2 of 2)]
[im 1/48]
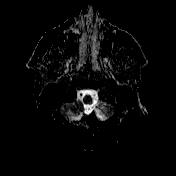
[im 24/48]
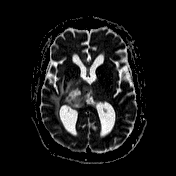
[im 48/48]
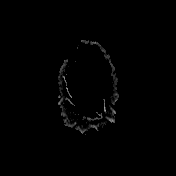

[Series 7: T1 · sagittal · 5.0mm · 0.75mm/px · 1 of 24 slices shown (1 of 2)]
[im 1/24]
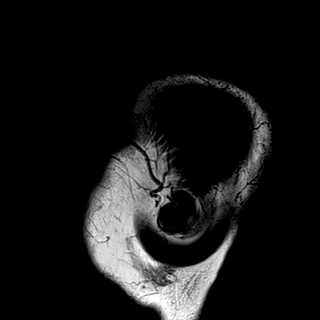

[Series 8: T2 · axial · 5.0mm · 0.62mm/px · z∈[-62,+99]mm · 2 of 26 slices shown (1 of 2)]
[im 1/26]
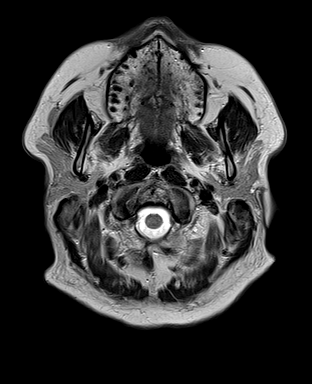
[im 26/26]
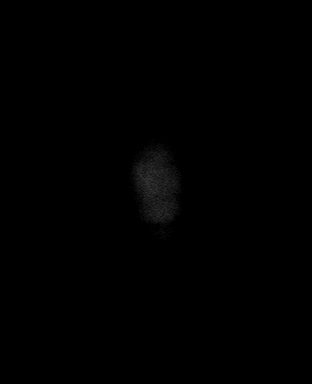

[Series 10: swi_images · axial · 3.0mm · 0.75mm/px · z∈[-87,+124]mm · 4 of 72 slices shown]
[im 1/72]
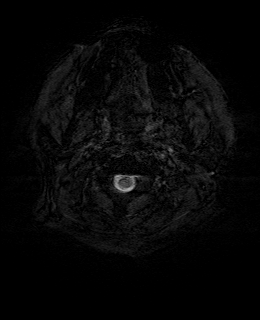
[im 24/72]
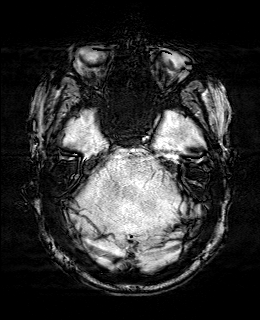
[im 48/72]
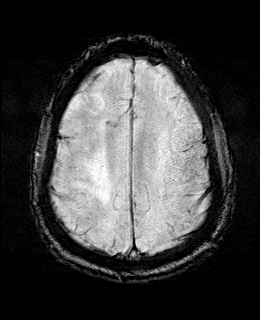
[im 72/72]
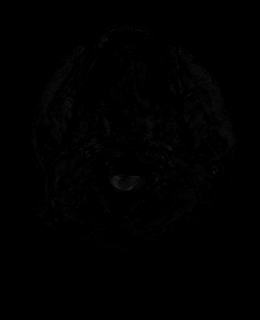

[Series 11: FLAIR · axial · 3.0mm · 0.75mm/px · z∈[-58,+94]mm · 3 of 52 slices shown]
[im 1/52]
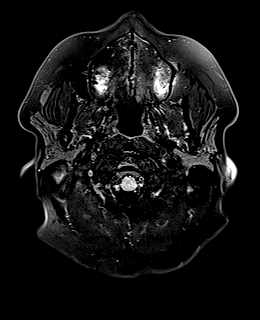
[im 26/52]
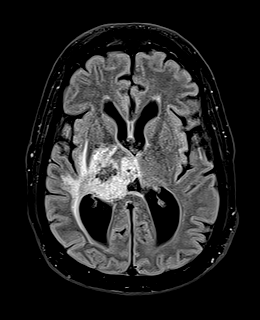
[im 52/52]
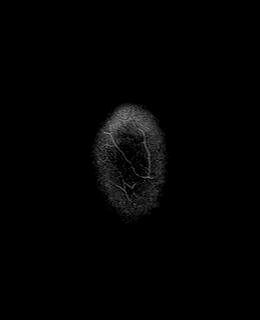

[Series 12: T1 · axial · 1.0mm · 0.94mm/px · z∈[-53,+89]mm · 9 of 144 slices shown (2 of 2)]
[im 1/144]
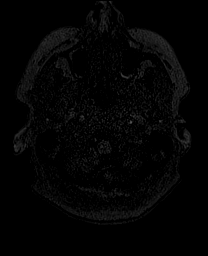
[im 18/144]
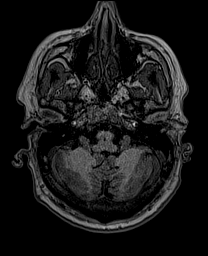
[im 36/144]
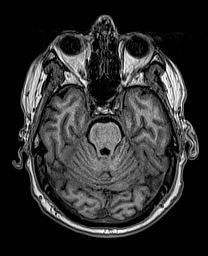
[im 54/144]
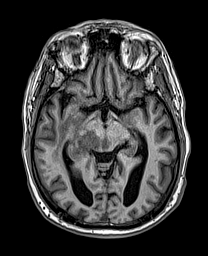
[im 72/144]
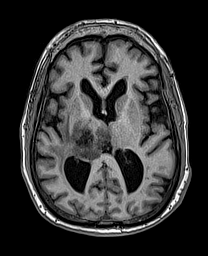
[im 90/144]
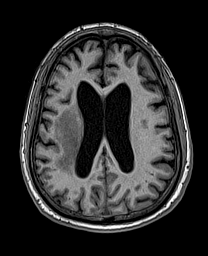
[im 108/144]
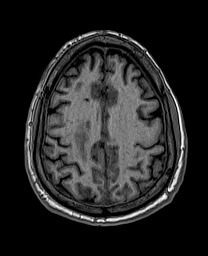
[im 126/144]
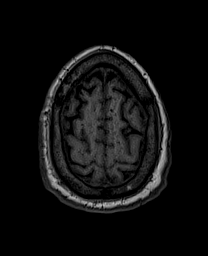
[im 144/144]
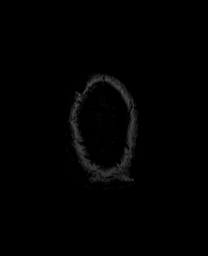

[Series 13: cor dwi_tracew · coronal · 5.0mm · 1.53mm/px · 4 of 60 slices shown]
[im 1/60]
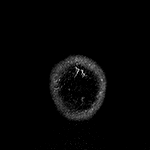
[im 20/60]
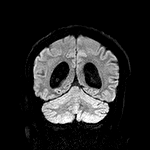
[im 40/60]
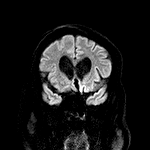
[im 60/60]
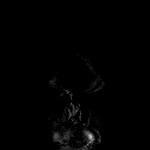

[Series 14: cor dwi_adc · coronal · 5.0mm · 1.53mm/px · 2 of 30 slices shown]
[im 1/30]
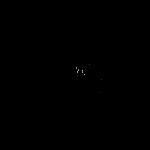
[im 30/30]
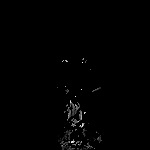

[Series 15: T2 · coronal · 5.0mm · 0.57mm/px · 2 of 32 slices shown (2 of 2)]
[im 1/32]
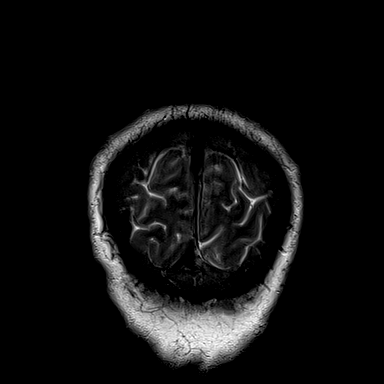
[im 32/32]
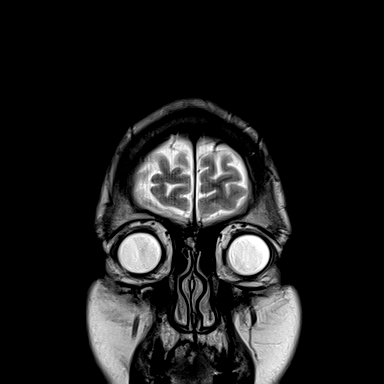

[Series 16: T1 post-contrast · axial · 1.0mm · 0.94mm/px · z∈[-53,+89]mm · 9 of 144 slices shown (1 of 3)]
[im 1/144]
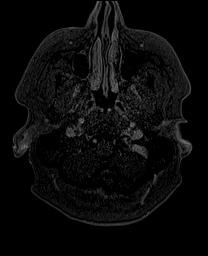
[im 18/144]
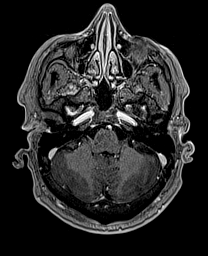
[im 36/144]
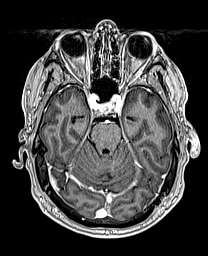
[im 54/144]
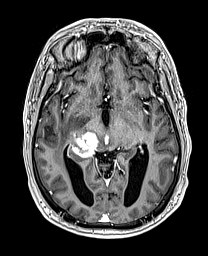
[im 72/144]
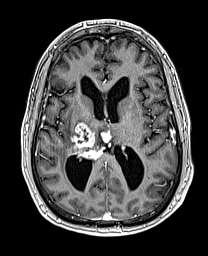
[im 90/144]
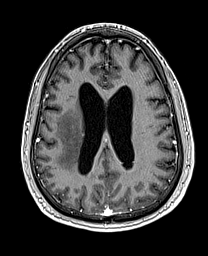
[im 108/144]
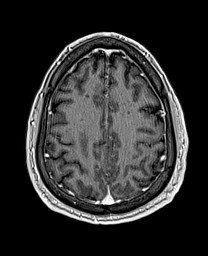
[im 126/144]
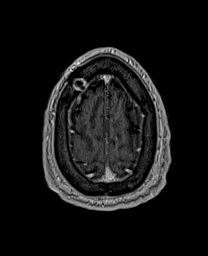
[im 144/144]
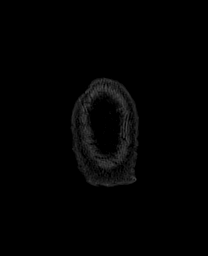

[Series 17: T1 post-contrast · coronal · 5.0mm · 0.43mm/px · 2 of 32 slices shown (2 of 3)]
[im 1/32]
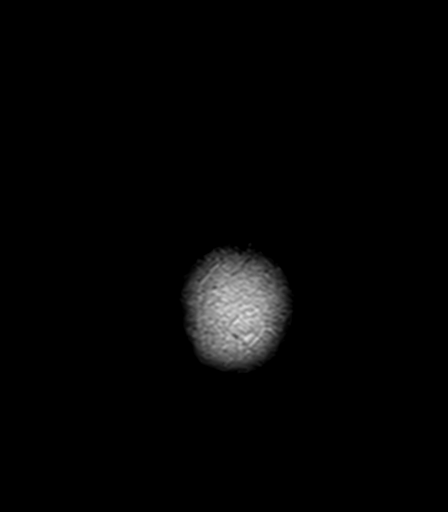
[im 32/32]
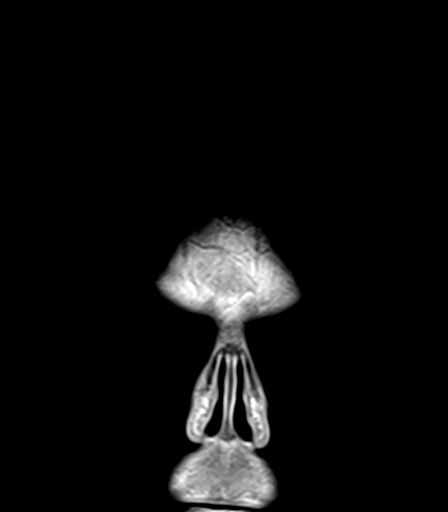

[Series 18: T1 post-contrast · sagittal · 5.0mm · 0.75mm/px · 1 of 24 slices shown (3 of 3)]
[im 1/24]
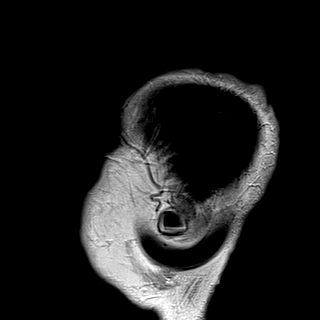

[48 of 48 positions shown; findings below may reference images not displayed]

FINDINGS: Brain:

Mild cerebral and cerebellar atrophy.

A heterogeneously enhancing mass centered within the right thalamus
has not significantly changed in size as compared to the prior brain
MRI of [DATE]. The mass measures 4.1 x 3.9 x 3.7 cm (AP x TV x
CC) (unchanged when remeasured on prior). Unchanged enhancing tumor
extension within the third ventricle and cerebral aqueduct.
Unchanged enhancing tumor extension into the right midbrain.
Unchanged enhancing tumor extension into the medial right temporal
occipital lobes, right basal ganglia, right internal capsule and
right corona radiata.

Stable surrounding T2/FLAIR hyperintense signal abnormality
extending into the right brainstem, right internal and external
capsules and right cerebral hemisphere white matter.

Unchanged mild ventricular prominence likely reflecting some degree
of obstruction at the level of the third ventricle/cerebral
aqueduct.

No evidence of acute infarction. No extra-axial fluid collection. No
midline shift at the level of the septum pellucidum.

As before, there are small scattered foci of SWI signal loss at the
tumor site which may reflect chronic blood products and/or
mineralization.

Vascular: Expected proximal arterial flow voids.

Skull and upper cervical spine: Right frontal burr hole with
overlying cranioplasty. No focal marrow lesion.

Sinuses/Orbits: Visualized orbits show no acute finding. No
significant paranasal sinus disease at the imaged levels.
IMPRESSION: Stable examination as compared to [DATE].

Unchanged size of an enhancing tumor centered within the right
thalamus, with extension into surrounding structures (including the
third ventricle/cerebral aqueduct), as detailed. Stable surrounding
T2/FLAIR hyperintense signal abnormality extending into the right
brainstem, right internal and external capsules and right cerebral
hemispheric white matter.

Unchanged mild ventricular prominence, likely reflecting some degree
of obstruction at the level of the third ventricle/cerebral
aqueduct.

## 2020-06-16 MED ORDER — GADOBUTROL 1 MMOL/ML IV SOLN
9.0000 mL | Freq: Once | INTRAVENOUS | Status: AC | PRN
  Administered 2020-06-16: 9 mL via INTRAVENOUS

## 2020-06-16 NOTE — Therapy (Signed)
[facility]. [place], [place], [address] Phone: [phone]   Fax:  [phone]  Physical Therapy Treatment  Patient Details  Name: Aaron Espinoza MRN: [id number] Date of Birth: 08-Dec-1975 Referring Provider (PT): Ventura Sellers, MD   Encounter Date: 06/16/2020   PT End of Session - 06/16/20 1108    Visit Number 3    Date for PT Re-Evaluation 08/02/20    Authorization Type [employer] - 30 visits for year    PT Start Time 1023    PT Stop Time 1100    PT Time Calculation (min) 37 min    Equipment Utilized During Treatment Gait belt    Activity Tolerance Patient tolerated treatment well;No increased pain    Behavior During Therapy WFL for tasks assessed/performed           Past Medical History:  Diagnosis Date  . BPH (benign prostatic hyperplasia) 07/20/2015  . Brain mass   . Cognitive dysfunction 01/2019  . Fatigue 01/2019  . Headache   . NF (neurofibromatosis) ([place]) 07/20/2015   NF1    Past Surgical History:  Procedure Laterality Date  . HAND SURGERY Left    pinky - tumor benign  . LEG SURGERY Left    inner thigh tumor - benign  . VENTRICULOSTOMY N/A 03/17/2019   Procedure: ENDOSCOPIC THIRD VENTRICULOSTOMY;  Surgeon: Vallarie Mare, MD;  Location: [facility];  Service: Neurosurgery;  Laterality: N/A;  ENDOSCOPIC THIRD VENTRICULOSTOMY    There were no vitals filed for this visit.   Subjective Assessment - 06/16/20 1025    Subjective Some left knee pain today mainly when starting to walk    Currently in Pain? Yes                             [facility] Adult PT Treatment/Exercise - 06/16/20 0001      Transfers   Transfers Sit to Stand;Stand to Sit;Lateral/Scoot Transfers   2 x 10.   Sit to Stand 4: Min guard;4: Min assist    Sit to Stand Details Tactile cues for sequencing;Tactile cues for placement    Five time sit to stand comments  with CGA-minA; mod cuing required to avoid pushing  back on WC    Stand to Sit 4: Min guard    Stand to Sit Details (indicate cue type and reason) Tactile cues for sequencing;Tactile cues for weight shifting;Tactile cues for placement;Manual facilitation for weight shifting    Lateral/Scoot Transfers --   Scooting fwd/bwd in WC with Sit to stands     Ambulation/Gait   Pre-Gait Activities Standing lateral WS mod  A in  bars. Standing march 5x2 right up/left down withL knee blocked; x5 left up    Gait Comments --      Exercises   Exercises Knee/Hip;Lumbar      Lumbar Exercises: Seated   Other Seated Lumbar Exercises cues for upright sitting, core activation, reaching outside of BOS multidirectionally reach and place cones for anterior WS x10    Other Seated Lumbar Exercises STS x5 with cues for RLE foot placement along with anterior weight shift      Knee/Hip Exercises: Seated   Long Arc Quad Both;2 sets;10 reps    Long Arc Quad Limitations very limited movement LLE with hip flexor compensation    Heel Slides Left;2 sets;5 reps   difficult   Ball Squeeze x15 3 sec hold    Clamshell  with TheraBand Red   x15 more with rle,   Marching Both;2 sets;10 reps    Hamstring Curl Right;2 sets;10 reps    Hamstring Limitations red TB    Abduction/Adduction  Left;AROM;15 reps   tactile cue alt ABD and ADD ROM with initial guided mvmt                   PT Short Term Goals - 06/07/20 1659      PT SHORT TERM GOAL #1   Title Independent with initial HEP with caregiver assist    Time 2    Period Weeks    Status New    Target Date 06/21/20      PT SHORT TERM GOAL #2   Title Pt will demonstrate improved independent anterior weight shift for improved ease with initiation of sit to stand transfers    Time 4    Period Weeks    Status New    Target Date 07/05/20      PT SHORT TERM GOAL #3   Title Pt to be able to complete TUG and attain initial score.    Time 2    Period Weeks    Status New    Target Date 06/21/20              PT Long Term Goals - 06/07/20 1700      PT LONG TERM GOAL #1   Title Independent with advanced HEP with caregiver assist    Time 8    Period Weeks    Status New    Target Date 08/02/20      PT LONG TERM GOAL #2   Title Pt will be able to scoot forward and laterally in sitting to assist with repositioning, pressure releife for prolonged sitting, and assist with ease of transfers.    Time 8    Period Weeks    Status New    Target Date 08/02/20      PT LONG TERM GOAL #3   Title Pt will demonstrate improved functional strength of LLE to at least 3+/5 to demonstrate improved active isolated movement.    Time 8    Period Weeks    Status New    Target Date 08/02/20      PT LONG TERM GOAL #4   Title Pt will be able to ambulate 100 ft with LRAD, no greater than CS, and without LOB  to facilitate safety with negotiation of home and community environments.    Time 8    Period Weeks    Target Date 08/02/20                 Plan - 06/16/20 1109    Clinical Impression Statement Arrived a few minutes late today. Pt tolerated progression to TE well.  Start of session with review of HEP exercises. Able to perform STS with CGA-minA with less push back onto [facility] after mod cues and repeated practice of scooting forward to improve foot placement for initiation of STS. There was not consistent carryover of this so this was repeated with each practice of STS.   Was able to complete some lateral  WS with mod cues and assist for incr LLE WB, and tolerated some standing marches with LLE blocked in stance.   Further education on process for getting AFO/brace and will follow up with this in future sessions    PT Treatment/Interventions ADLs/Self Care Home Management;Electrical Stimulation;DME Instruction;Gait training;Neuromuscular re-education;Therapeutic exercise;Therapeutic activities;Functional mobility training;Patient/family education;Orthotic  Fit/Training;Manual techniques;Energy  conservation;Taping;Vestibular;Cryotherapy;Moist Heat    PT Next Visit Plan Plan to get a TUG score. BLE strengthening, balance and transfer training, gait training. May benefit from trial of AFO to improve ankle stability on the left.    Consulted and Agree with Plan of Care Patient;Family member/caregiver    Family Member Consulted Wife, [name]           Patient will benefit from skilled therapeutic intervention in order to improve the following deficits and impairments:  Abnormal gait,Decreased coordination,Difficulty walking,Pain,Impaired UE functional use,Increased muscle spasms,Impaired tone,Decreased balance,Decreased mobility,Decreased strength,Increased edema,Impaired sensation,Postural dysfunction,Decreased activity tolerance,Decreased knowledge of use of DME  Visit Diagnosis: Spastic hemiplegia affecting left nondominant side, unspecified etiology ([place])  Muscle weakness (generalized)  Unsteadiness on feet  Other abnormalities of gait and mobility     Problem List Patient Active Problem List   Diagnosis Date Noted  . Right arm pain 07/27/2019  . 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 NF (neurofibromatosis) ([facility]) 07/20/2015  . BPH (benign prostatic hyperplasia) 07/20/2015  . ED (erectile dysfunction) 07/20/2015  . Seborrheic keratoses 07/20/2015  . Cherry angioma 07/20/2015    Hall Busing , PT, DPT  06/16/2020, 11:27 AM  [facility]. [place], [place], [address] Phone: [phone]   Fax:  [phone]  Name: Aaron Espinoza MRN: [id number] Date of Birth: 02-21-1975

## 2020-06-17 NOTE — Therapy (Signed)
[facility]. [place], [place], [address] Phone: [phone]   Fax:  [phone]  Occupational Therapy Treatment  Patient Details  Name: Aaron Espinoza MRN: [id number] Date of Birth: 01/26/1976 Referring Provider (OT): Dr. Arlan Organ   Encounter Date: 06/16/2020   OT End of Session - 06/16/20 1130    Visit Number 3    Number of Visits 17    Date for OT Re-Evaluation 09/05/20    Authorization Type BCBS    Progress Note Due on Visit 10    OT Start Time 1100    OT Stop Time 1145    OT Time Calculation (min) 45 min    Activity Tolerance Patient tolerated treatment well    Behavior During Therapy [facility] for tasks assessed/performed           Past Medical History:  Diagnosis Date  . BPH (benign prostatic hyperplasia) 07/20/2015  . Brain mass   . Cognitive dysfunction 01/2019  . Fatigue 01/2019  . Headache   . NF (neurofibromatosis) ([facility]) 07/20/2015   NF1    Past Surgical History:  Procedure Laterality Date  . HAND SURGERY Left    pinky - tumor benign  . LEG SURGERY Left    inner thigh tumor - benign  . VENTRICULOSTOMY N/A 03/17/2019   Procedure: ENDOSCOPIC THIRD VENTRICULOSTOMY;  Surgeon: Vallarie Mare, MD;  Location: [facility];  Service: Neurosurgery;  Laterality: N/A;  ENDOSCOPIC THIRD VENTRICULOSTOMY    There were no vitals filed for this visit.   Subjective Assessment - 06/16/20 1109    Subjective  Pt states he was able to don his t-shirt independently today    Patient is accompanied by: Family member   mother   Pertinent History anaplastic astrocytoma of thalamus (diagnosed January 2021)    Limitations high fall risk, active CA (Chemo treatment 1x/month for 5 days)    Patient Stated Goals Regain LUE functional use, work on walking and being more independent w/ ADLs    Currently in Pain? No/denies              [facility] OT Assessment - 06/16/20 1200      AROM   Overall AROM Comments Unable  to achieve AROM of L shoulder flexion/internal rotation/external rotation, wrist, and hand/fingers    AROM Assessment Site Shoulder;Elbow    Right/Left Shoulder Left    Left Shoulder Extension 8 Degrees    Left Shoulder ABduction 27 Degrees    Right/Left Elbow Left    Left Elbow Flexion 90    Left Elbow Extension 46   Able to extend to 46* of elbow flexion     PROM   PROM Assessment Site Shoulder;Elbow;Forearm;Wrist    Right/Left Shoulder Left    Left Shoulder Flexion 156 Degrees    Left Shoulder ABduction 53 Degrees    Left Shoulder Internal Rotation 39 Degrees    Left Shoulder External Rotation 80 Degrees    Right/Left Elbow Left    Left Elbow Flexion 130    Left Elbow Extension 20   Extended to 20* of flexion   Right/Left Forearm Left    Left Forearm Pronation 60 Degrees    Left Forearm Supination 48 Degrees    Right/Left Wrist Left    Left Wrist Extension 10 Degrees    Left Wrist Flexion 75 Degrees              OT Treatments/Exercises (OP) - 06/16/20 1145  ADLs   Long-handled Shoe Horn Pt brought in self-purchased shoe horn w/ OT demo'ing and providing education on use. Pt able to return demonstration w/ min v/c to initially orient shoe horn after modeling      Splinting   Splinting Pt brought in self-purchased wrist brace w/ OT donning brace to assess effectiveness of positioning; brace able to support L wrist in near neutral position without pain   Pt also demo'd improved forearm sup/pro w/ brace on and wrist in neutral     Manual Therapy   Manual Therapy Passive ROM    Passive ROM OT-facilitated gentle PROM of shoulder flexion/extension, elbow flexion/extension, and forearm sup/pro to facilitate decreased spasticity and increased strength and mobility of LUE for functional use; pt reported discomfort improved w/ repetition             OT Short Term Goals - 06/14/20 1545      OT SHORT TERM GOAL #1   Title Pt and spouse will be independent w/ initial HEP  for LUE (self ROM/stretches)    Baseline No HEP at this time    Time 4    Period Weeks    Status On-going    Target Date 07/05/20      OT SHORT TERM GOAL #2   Title Pt will be able to don overhead/open-front shirt w/ Mod A    Baseline Dependent w/ UB dressing per pt report    Time 4    Period Weeks    Status On-going      OT SHORT TERM GOAL #3   Title Pt to verbalize understanding of compensatory strategies, including A/E prn, to increase independence with cutting food and dressing (rocker knife, shoe buttons, etc)    Baseline Decreased knowledge of compensatory strategies    Time 4    Period Weeks    Status On-going      OT SHORT TERM GOAL #4   Title Pt to be independent with wear and care of L hand orthosis prn    Baseline No braces/orthoses at this time    Time 4    Period Weeks    Status On-going      OT SHORT TERM GOAL #5   Title Pt to verbalize understanding of visual scanning strategies and safety considerations due to lack of sensation L side    Baseline Decreaseed knowledge of safety strategies    Time 4    Period Weeks    Status On-going             OT Long Term Goals - 06/14/20 1545      OT LONG TERM GOAL #1   Title Pt will be able to complete UB/LB dressing w/ Min A    Baseline Per pt report, Dep w/ dressing    Time 8    Period Weeks    Status On-going      OT LONG TERM GOAL #2   Title Pt to demo use of LUE as gross assist for bilateral tasks and ADLs    Time 8    Period Weeks    Status On-going      OT LONG TERM GOAL #3   Title Pt to perform simple snack/meal prep w/ necessary DME/compensatory strategies prn at distant SPV level    Baseline Unable to complete meal prep tasks at this time    Time 8    Period Weeks    Status On-going      OT LONG TERM GOAL #  4   Title Pt to attend to L side of body with transfers, functional ambulation, and scanning activities at least 75% of the time    Baseline L-sided inattention/visual field cut    Time 8     Period Weeks    Status On-going      OT LONG TERM GOAL #5   Title Pt to perform gross finger extension to 75% or greater to facilitate release of cylindrical objects    Baseline Decreased finger extension    Time 8    Period Weeks    Status On-going      OT LONG TERM GOAL #6   Title Pt to demo AROM of shoulder flexion to 40* for low level reaching during ADLs    Baseline No active L shoulder flexion at this time    Time 8    Period Weeks    Status On-going             Plan - 06/16/20 1140    Clinical Impression Statement OT completed more comprehensive assessment of AROM and PROM of LUE (shoulder, elbow, forearm, wrist) this session; pt demo'd some activation w/ shoulder extension, elbow flexion, and elbow extension. While AROM was restricted and is functionally limiting for pt, PROM was more functional w/ pt reporting discomfort and demo'ing adverse reponse particularly w/ shoulder external rotation, supination, and wrist extension. Measurements taken at end range w/ OT providing education on interpretation. OT also demo'd and provided education on adaptive dressing techniques to continue to work toward functional dressing goals.    OT Occupational Profile and History Detailed Assessment- Review of Records and additional review of physical, cognitive, psychosocial history related to current functional performance    Occupational performance deficits (Please refer to evaluation for details): ADL's;IADL's;Leisure;Social Participation    Body Structure / Function / Physical Skills ADL;Decreased knowledge of use of DME;Strength;Dexterity;GMC;Balance;Tone;UE functional use;Proprioception;Body mechanics;IADL;ROM;Vision;Mobility;Coordination;FMC;Muscle spasms;Pain;Sensation    Cognitive Skills Attention;Perception;Safety Awareness    Rehab Potential Fair   given diagnosis   Clinical Decision Making Multiple treatment options, significant modification of task necessary    Comorbidities  Affecting Occupational Performance: May have comorbidities impacting occupational performance    Modification or Assistance to Complete Evaluation  Min-Moderate modification of tasks or assist with assess necessary to complete eval    OT Frequency 2x / week   May reduce frequency to 1x/week after first few weeks due to insurance VL   OT Duration 8 weeks    OT Treatment/Interventions Self-care/ADL training;Moist Heat;DME and/or AE instruction;Splinting;Therapeutic activities;Therapeutic exercise;Cognitive remediation/compensation;Coping strategies training;Neuromuscular education;Functional Mobility Training;Passive range of motion;Visual/perceptual remediation/compensation;Energy conservation;Manual Therapy;Patient/family education;Electrical Stimulation;Aquatic Therapy;Balance training    Plan Weight-bearing; L resting hand orthosis?    Consulted and Agree with Plan of Care Patient;Family member/caregiver    Family Member Consulted Wife [name])           Patient will benefit from skilled therapeutic intervention in order to improve the following deficits and impairments:   Body Structure / Function / Physical Skills: ADL,Decreased knowledge of use of DME,Strength,Dexterity,GMC,Balance,Tone,UE functional use,Proprioception,Body mechanics,IADL,ROM,Vision,Mobility,Coordination,FMC,Muscle spasms,Pain,Sensation Cognitive Skills: Attention,Perception,Safety Awareness     Visit Diagnosis: Spastic hemiplegia affecting left nondominant side, unspecified etiology ([place])  Other lack of coordination  Muscle weakness (generalized)    Problem List Patient Active Problem List   Diagnosis Date Noted  . Right arm pain 07/27/2019  . Anaplastic astrocytoma of thalamus ([place]) 03/17/2019  . NF (neurofibromatosis) ([facility] [place]) 07/20/2015  . BPH (benign prostatic hyperplasia) 07/20/2015  . ED (erectile dysfunction) 07/20/2015  .  Seborrheic keratoses 07/20/2015  . Cherry angioma 07/20/2015     Kathrine Cords, OTR/L, MSOT 06/16/2020, 11:45 AM  [facility]. [place], [place], [address] Phone: [phone]   Fax:  [phone]  Name: Aaron Espinoza MRN: [id number] Date of Birth: 09/14/1975

## 2020-06-19 ENCOUNTER — Inpatient Hospital Stay: Payer: BC Managed Care – PPO | Attending: Internal Medicine | Admitting: Internal Medicine

## 2020-06-19 ENCOUNTER — Other Ambulatory Visit: Payer: Self-pay

## 2020-06-19 ENCOUNTER — Inpatient Hospital Stay: Payer: BC Managed Care – PPO

## 2020-06-19 VITALS — BP 138/79 | HR 79 | Temp 97.6°F | Resp 14 | Ht 72.0 in | Wt 201.2 lb

## 2020-06-19 DIAGNOSIS — Z923 Personal history of irradiation: Secondary | ICD-10-CM | POA: Insufficient documentation

## 2020-06-19 DIAGNOSIS — C710 Malignant neoplasm of cerebrum, except lobes and ventricles: Secondary | ICD-10-CM | POA: Insufficient documentation

## 2020-06-19 DIAGNOSIS — N400 Enlarged prostate without lower urinary tract symptoms: Secondary | ICD-10-CM | POA: Insufficient documentation

## 2020-06-19 DIAGNOSIS — Z9221 Personal history of antineoplastic chemotherapy: Secondary | ICD-10-CM | POA: Insufficient documentation

## 2020-06-19 DIAGNOSIS — R531 Weakness: Secondary | ICD-10-CM | POA: Diagnosis not present

## 2020-06-19 DIAGNOSIS — Q8500 Neurofibromatosis, unspecified: Secondary | ICD-10-CM | POA: Diagnosis not present

## 2020-06-19 DIAGNOSIS — Z79899 Other long term (current) drug therapy: Secondary | ICD-10-CM | POA: Diagnosis not present

## 2020-06-19 DIAGNOSIS — Z87891 Personal history of nicotine dependence: Secondary | ICD-10-CM | POA: Diagnosis not present

## 2020-06-19 NOTE — Progress Notes (Signed)
[facility] at [place], [place] [address] [phone]   Interval Evaluation  Date of Service: 06/19/20 Patient Name: Aaron Espinoza Patient MRN: [id number] Patient DOB: September 15, 1975 Provider: Ventura Sellers, MD  Identifying Statement:  Aaron Espinoza is a 45 y.o. male with right thalamic anaplastic astrocytoma   Oncologic History: Oncology History  Anaplastic astrocytoma of thalamus ([place])  03/17/2019 Surgery   Stereotactic biopsy by Dr. Marcello Moores; path demonstrates anaplastic astrocytoma   04/12/2019 - 05/21/2019 Radiation Therapy   IMRT with concurrent Temodar 66m/m2 daily   06/14/2019 -  Chemotherapy    Patient is on Treatment Plan: BRAIN ANAPLASTIC GLIOMA GRADE III TEMOZOLOMIDE POST XRT Q28D        Biomarkers:  MGMT Unknown.  IDH 1/2 Wild type.  EGFR Unknown  TERT Unknown   Interval History:  Aaron Espinoza to clinic for follow up now having completed cycle #12 of 5-day TMZ.  Left sided weakness remains dense.  He is now relying full time on a four pointed walker, no longer able to use the cane for ambulation.  He has completed taper of decadron, and is only taking hydrocortisone 134mdaily. Fatigue is otherwise stable from prior. No other issues tolerating chemotherapy.    H+P (03/30/19) Patient presented to medical attention in late January, when he noticed mild confusion, gait imbalance.  These symptoms worsened over the next ~10 days, and were accompanied by incontinence.  Brain MRI was performed and demonstrated a right thalamic mass consistent with likely primary brain tumor.  He underwent stereotactic biopsy and ventriculostomy on 03/17/19 with Dr. ThMarcello Moores Following surgery he describes considerable improvement in most of his symptoms; at this time he is near baseline function but "very fatigued" overall.  No seizures or headaches since surgery.  Medications: Current Outpatient Medications on File Prior to  Visit  Medication Sig Dispense Refill  . acetaminophen (TYLENOL) 500 MG tablet Take 1,000 mg by mouth every 6 (six) hours as needed.    . Marland Kitchenexamethasone (DECADRON) 2 MG tablet TAKE 2 TABLETS BY MOUTH DAILY 60 tablet 1  . docusate sodium (COLACE) 100 MG capsule Take 100 mg by mouth daily.    . hydrocortisone (CORTEF) 10 MG tablet Take 1 tablet (10 mg total) by mouth 2 (two) times daily. 60 tablet 2  . ibuprofen (ADVIL) 200 MG tablet Take 800 mg by mouth every 6 (six) hours as needed.    . Marland Kitchenisinopril (ZESTRIL) 10 MG tablet TAKE 1 TABLET BY MOUTH EVERY DAY 90 tablet 1  . NON FORMULARY Take 2 capsules by mouth daily. [employer] CORDYCEPS for Energy Support    . NON FORMULARY Take 2 capsules by mouth daily. [employer] BRAIN for Mental Clarity    . NON FORMULARY Take 2 capsules by mouth daily. [employer] STAMETS 7 for Daily Immune Support    . NON FORMULARY Take 2 capsules by mouth daily. [employer] T[place]AIL for Immune Support    . ondansetron (ZOFRAN) 8 MG tablet Take 1 tablet (8 mg total) by mouth 2 (two) times daily as needed (nausea and vomiting). May take 30-60 minutes prior to Temodar administration if nausea/vomiting occurs. (Patient not taking: Reported on 09/20/2019) 30 tablet 1  . pantoprazole (PROTONIX) 40 MG tablet TAKE 1 TABLET BY MOUTH EVERY DAY 30 tablet 3  . polyethylene glycol (MIRALAX / GLYCOLAX) 17 g packet Take 1 packet by mouth daily.    . STOOL SOFTENER 100  MG capsule TAKE 1 TABLET BY MOUTH 2 TIMES EVERY DAY AT BEDTIME AS NEEDED 60 tablet 2  . tamsulosin (FLOMAX) 0.4 MG CAPS capsule Take 1 capsule (0.4 mg total) by mouth daily after supper. 30 capsule 3  . temozolomide (TEMODAR) 100 MG capsule Take 4 capsules (400 mg total) by mouth at bedtime. 20 capsule 0  . temozolomide (TEMODAR) 100 MG capsule TAKE 4 CAPSULES BY MOUTH 1 TIME A DAY AT BEDTIME FOR 5 DAYS OF 28 DAY CYCLE. (TOTAL DAILY DOSE OF 400MG) 20 capsule 0  . temozolomide (TEMODAR) 100 MG capsule Take 4 capsules  (400 mg total) by mouth at bedtime. 20 capsule 0   No current facility-administered medications on file prior to visit.    Allergies:  Allergies  Allergen Reactions  . Gadolinium Derivatives Itching   Past Medical History:  Past Medical History:  Diagnosis Date  . BPH (benign prostatic hyperplasia) 07/20/2015  . Brain mass   . Cognitive dysfunction 01/2019  . Fatigue 01/2019  . Headache   . NF (neurofibromatosis) ([place]) 07/20/2015   NF1   Past Surgical History:  Past Surgical History:  Procedure Laterality Date  . HAND SURGERY Left    pinky - tumor benign  . LEG SURGERY Left    inner thigh tumor - benign  . VENTRICULOSTOMY N/A 03/17/2019   Procedure: ENDOSCOPIC THIRD VENTRICULOSTOMY;  Surgeon: Vallarie Mare, MD;  Location: [facility];  Service: Neurosurgery;  Laterality: N/A;  ENDOSCOPIC THIRD VENTRICULOSTOMY   Social History:  Social History   Socioeconomic History  . Marital status: Married    Spouse name: Not on file  . Number of children: Not on file  . Years of education: Not on file  . Highest education level: Not on file  Occupational History  . Not on file  Tobacco Use  . Smoking status: Former Smoker    Types: Cigars, Cigarettes  . Smokeless tobacco: Never Used  . Tobacco comment: Quit smoking cigarettes 25 yrs ago, quit cigars in 2020  Vaping Use  . Vaping Use: Never used  Substance and Sexual Activity  . Alcohol use: Yes    Alcohol/week: 6.0 - 9.0 standard drinks    Types: 6 - 9 Standard drinks or equivalent per week  . Drug use: No  . Sexual activity: Not on file  Other Topics Concern  . Not on file  Social History Narrative  . Not on file   Social Determinants of Health   Financial Resource Strain: Not on file  Food Insecurity: Not on file  Transportation Needs: Not on file  Physical Activity: Not on file  Stress: Not on file  Social Connections: Not on file  Intimate Partner Violence: Not on file   Family History:  Family History   Problem Relation Age of Onset  . Cancer Paternal Grandfather        Prostate    Review of Systems: Constitutional: swelling in face Eyes: Doesn't report blurriness of vision Ears, nose, mouth, throat, and face: Doesn't report sore throat Respiratory: Doesn't report cough, dyspnea or wheezes Cardiovascular: Doesn't report palpitation, chest discomfort  Gastrointestinal:  Doesn't report nausea, constipation, diarrhea GU: difficulty initiating stream, nocturia Skin: Doesn't report skin rashes Neurological: Per HPI Musculoskeletal: Doesn't report joint pain Behavioral/Psych: Doesn't report anxiety  Physical Exam: Vitals:   06/19/20 1103  BP: 138/79  Pulse: 79  Resp: 14  Temp: 97.6 F (36.4 C)  SpO2: 98%   KPS: 60. General: cushingoid features Head: Normal EENT: No conjunctival  injection or scleral icterus.  Lungs: Resp effort normal Cardiac: Regular rate Abdomen: Non-distended abdomen Skin: No rashes cyanosis or petechiae. Extremities: No clubbing or edema  Neurologic Exam: Mental Status: Awake, alert, attentive to examiner. Oriented to self and environment. Language is fluent with intact comprehension.  Cranial Nerves: Visual acuity is grossly normal. L visual field impairment. Extra-ocular movements intact. No ptosis. Face is symmetric Motor: Tone and bulk are normal. Power is 1/5 in left arm and 3/5 in left leg. Reflexes are symmetric, no pathologic reflexes present.  Sensory: Intact to light touch Gait: Hemiparetic, not independent  Labs: I have reviewed the data as listed    Component Value Date/Time   NA 138 05/22/2020 1349   K 3.9 05/22/2020 1349   CL 100 05/22/2020 1349   CO2 28 05/22/2020 1349   GLUCOSE 95 05/22/2020 1349   BUN 13 05/22/2020 1349   CREATININE 0.74 05/22/2020 1349   CREATININE 0.83 03/04/2019 1206   CALCIUM 9.5 05/22/2020 1349   PROT 6.9 05/22/2020 1349   ALBUMIN 4.0 05/22/2020 1349   AST 9 (L) 05/22/2020 1349   ALT 13 05/22/2020  1349   ALKPHOS 49 05/22/2020 1349   BILITOT 0.6 05/22/2020 1349   GFRNONAA >60 05/22/2020 1349   GFRNONAA 106 03/04/2019 1206   GFRAA >60 10/25/2019 1209   GFRAA 123 03/04/2019 1206   Lab Results  Component Value Date   WBC 8.3 05/22/2020   NEUTROABS 6.0 05/22/2020   HGB 16.2 05/22/2020   HCT 45.6 05/22/2020   MCV 87.7 05/22/2020   PLT 233 05/22/2020   Imaging:  [facility] Clinician Interpretation: I have personally reviewed the CNS images as listed.  My interpretation, in the context of the patient's clinical presentation, is stable disease  MR BRAIN W WO CONTRAST  Result Date: 06/18/2020 CLINICAL DATA:  Anaplastic astrocytoma of thalamus. Brain/CNS neoplasm, assess treatment response. EXAM: MRI HEAD WITHOUT AND WITH CONTRAST TECHNIQUE: Multiplanar, multiecho pulse sequences of the brain and surrounding structures were obtained without and with intravenous contrast. CONTRAST:  66m GADAVIST GADOBUTROL 1 MMOL/ML IV SOLN. Please note the patient was pre-medicated prior to contrast administration. COMPARISON:  Prior brain MRI examinations 04/01/2020 and earlier. FINDINGS: Brain: Mild cerebral and cerebellar atrophy. A heterogeneously enhancing mass centered within the right thalamus has not significantly changed in size as compared to the prior brain MRI of 04/01/2020. The mass measures 4.1 x 3.9 x 3.7 cm (AP x TV x CC) (unchanged when remeasured on prior). Unchanged enhancing tumor extension within the third ventricle and cerebral aqueduct. Unchanged enhancing tumor extension into the right midbrain. Unchanged enhancing tumor extension into the medial right temporal occipital lobes, right basal ganglia, right internal capsule and right corona radiata. Stable surrounding T2/FLAIR hyperintense signal abnormality extending into the right brainstem, right internal and external capsules and right cerebral hemisphere white matter. Unchanged mild ventricular prominence likely reflecting some degree of  obstruction at the level of the third ventricle/cerebral aqueduct. No evidence of acute infarction. No extra-axial fluid collection. No midline shift at the level of the septum pellucidum. As before, there are small scattered foci of SWI signal loss at the tumor site which may reflect chronic blood products and/or mineralization. Vascular: Expected proximal arterial flow voids. Skull and upper cervical spine: Right frontal burr hole with overlying cranioplasty. No focal marrow lesion. Sinuses/Orbits: Visualized orbits show no acute finding. No significant paranasal sinus disease at the imaged levels. IMPRESSION: Stable examination as compared to 04/01/2020. Unchanged size of an enhancing tumor centered  within the right thalamus, with extension into surrounding structures (including the third ventricle/cerebral aqueduct), as detailed. Stable surrounding T2/FLAIR hyperintense signal abnormality extending into the right brainstem, right internal and external capsules and right cerebral hemispheric white matter. Unchanged mild ventricular prominence, likely reflecting some degree of obstruction at the level of the third ventricle/cerebral aqueduct. Electronically Signed   By: Kellie Simmering DO   On: 06/18/2020 14:37   Assessment/Plan Anaplastic astrocytoma of thalamus ([place]) [C71.0]  Mr. Reimers presents today having completed cycle #12 5-day Temozolomide.  Although clinically stable, he continues to demonstrate spastic dense hemiparesis of the left side from interruption of corticospinal tracts.  MRI demonstrates stable enhancing and non-enhancing disease volumes.   We recommended deferring further chemotherapy given completion of 12 cycles.  He will continue with home PT and OT.   May discontinue hydrocortisone at this time.  We appreciate the opportunity to participate in the care of MIQUEL STACKS.   We ask that BEREN YNIGUEZ return to clinic in 3 months following next brain MRI, or sooner as  needed.  All questions were answered. The patient knows to call the clinic with any problems, questions or concerns. No barriers to learning were detected.  I have spent a total of 40 minutes of face-to-face and non-face-to-face time, excluding clinical staff time, preparing to see patient, ordering tests and/or medications, counseling the patient, and independently interpreting results and communicating results to the patient/family/caregiver   Ventura Sellers, MD Medical Director of Neuro-Oncology [facility] at [place] 06/19/20 10:49 AM

## 2020-06-20 ENCOUNTER — Ambulatory Visit: Payer: BC Managed Care – PPO

## 2020-06-20 ENCOUNTER — Other Ambulatory Visit: Payer: Self-pay

## 2020-06-20 ENCOUNTER — Ambulatory Visit: Payer: BC Managed Care – PPO | Admitting: Occupational Therapy

## 2020-06-20 DIAGNOSIS — R278 Other lack of coordination: Secondary | ICD-10-CM

## 2020-06-20 DIAGNOSIS — R414 Neurologic neglect syndrome: Secondary | ICD-10-CM | POA: Diagnosis not present

## 2020-06-20 DIAGNOSIS — R2689 Other abnormalities of gait and mobility: Secondary | ICD-10-CM

## 2020-06-20 DIAGNOSIS — M6281 Muscle weakness (generalized): Secondary | ICD-10-CM

## 2020-06-20 DIAGNOSIS — R2681 Unsteadiness on feet: Secondary | ICD-10-CM

## 2020-06-20 DIAGNOSIS — R208 Other disturbances of skin sensation: Secondary | ICD-10-CM | POA: Diagnosis not present

## 2020-06-20 DIAGNOSIS — G8114 Spastic hemiplegia affecting left nondominant side: Secondary | ICD-10-CM

## 2020-06-20 DIAGNOSIS — R41842 Visuospatial deficit: Secondary | ICD-10-CM | POA: Diagnosis not present

## 2020-06-20 NOTE — Therapy (Signed)
[facility]. [name], [place], [address] Phone: [phone]   Fax:  [phone]  Physical Therapy Treatment  Patient Details  Name: Aaron Espinoza MRN: [id number] Date of Birth: 02-02-1976 Referring Provider (PT): Ventura Sellers, MD   Encounter Date: 06/20/2020   PT End of Session - 06/20/20 1756    Visit Number 4    Date for PT Re-Evaluation 08/02/20    Authorization Type [employer] - 30 visits for year    PT Start Time 1623    PT Stop Time 1700    PT Time Calculation (min) 37 min    Equipment Utilized During Treatment Gait belt    Activity Tolerance Patient tolerated treatment well;No increased pain    Behavior During Therapy WFL for tasks assessed/performed           Past Medical History:  Diagnosis Date  . BPH (benign prostatic hyperplasia) 07/20/2015  . Brain mass   . Cognitive dysfunction 01/2019  . Fatigue 01/2019  . Headache   . NF (neurofibromatosis) ([place]) 07/20/2015   NF1    Past Surgical History:  Procedure Laterality Date  . HAND SURGERY Left    pinky - tumor benign  . LEG SURGERY Left    inner thigh tumor - benign  . VENTRICULOSTOMY N/A 03/17/2019   Procedure: ENDOSCOPIC THIRD VENTRICULOSTOMY;  Surgeon: Vallarie Mare, MD;  Location: [facility];  Service: Neurosurgery;  Laterality: N/A;  ENDOSCOPIC THIRD VENTRICULOSTOMY    There were no vitals filed for this visit.   Subjective Assessment - 06/20/20 1630    Subjective Saw Dr Mickeal Skinner. arm was hurting this morning but is feeling alright now. A little bit of L knee discomfort when it is bent    Currently in Pain? No/denies                             [facility] Adult PT Treatment/Exercise - 06/20/20 0001      Therapeutic Activites    Therapeutic Activities Other Therapeutic Activities    Other Therapeutic Activities STS 10 x 3 with RUE supported on chair anterior and facilitation for WS left, left foot blocked to facilitate  positioning.  Anterior WS to the left in sitting x 15. lateral scooting x 5 reps to the left with CGA min cues, mod A and mod cues to the right   cues given for nose over left toes for WS ant into left                 PT Education - 06/20/20 1805    Education Details Improtance of doing home exercises for strengthening    Person(s) Educated Patient;Spouse            PT Short Term Goals - 06/07/20 1659      PT SHORT TERM GOAL #1   Title Independent with initial HEP with caregiver assist    Time 2    Period Weeks    Status New    Target Date 06/21/20      PT SHORT TERM GOAL #2   Title Pt will demonstrate improved independent anterior weight shift for improved ease with initiation of sit to stand transfers    Time 4    Period Weeks    Status New    Target Date 07/05/20      PT SHORT TERM GOAL #3   Title Pt to be able to complete TUG  and attain initial score.    Time 2    Period Weeks    Status New    Target Date 06/21/20             PT Long Term Goals - 06/07/20 1700      PT LONG TERM GOAL #1   Title Independent with advanced HEP with caregiver assist    Time 8    Period Weeks    Status New    Target Date 08/02/20      PT LONG TERM GOAL #2   Title Pt will be able to scoot forward and laterally in sitting to assist with repositioning, pressure releife for prolonged sitting, and assist with ease of transfers.    Time 8    Period Weeks    Status New    Target Date 08/02/20      PT LONG TERM GOAL #3   Title Pt will demonstrate improved functional strength of LLE to at least 3+/5 to demonstrate improved active isolated movement.    Time 8    Period Weeks    Status New    Target Date 08/02/20      PT LONG TERM GOAL #4   Title Pt will be able to ambulate 100 ft with LRAD, no greater than CS, and without LOB  to facilitate safety with negotiation of home and community environments.    Time 8    Period Weeks    Target Date 08/02/20                  Plan - 06/20/20 1757    Clinical Impression Statement Arrived later to session today. Started session with practice of anterior WS with cues to get head towards the left knee with improvement noted in ability to get weight on both feet into standing. Some carryover of scoting forward was observed but especially when more fatigued it is harder to scoot left side forward and we spent alot of time on that today requiring iniital and intermittent mod A.  Spent much of session on then on  functional sit to stands with faciltiation for WS to the left side. and left foot blocked to help maintain proper alignment and he was able to get better WB through left side with this. Lateral scooting very easy toward the left today but required mod A and cues for achieving small scoot to the right, will benefit form more practice    PT Treatment/Interventions ADLs/Self Care Home Management;Electrical Stimulation;DME Instruction;Gait training;Neuromuscular re-education;Therapeutic exercise;Therapeutic activities;Functional mobility training;Patient/family education;Orthotic Fit/Training;Manual techniques;Energy conservation;Taping;Vestibular;Cryotherapy;Moist Heat    PT Next Visit Plan Plan to get a TUG score. BLE strengthening, balance and transfer training, gait training. May benefit from trial of AFO to improve ankle stability on the left.    Consulted and Agree with Plan of Care Patient;Family member/caregiver    Family Member Consulted Wife, [name]           Patient will benefit from skilled therapeutic intervention in order to improve the following deficits and impairments:  Abnormal gait,Decreased coordination,Difficulty walking,Pain,Impaired UE functional use,Increased muscle spasms,Impaired tone,Decreased balance,Decreased mobility,Decreased strength,Increased edema,Impaired sensation,Postural dysfunction,Decreased activity tolerance,Decreased knowledge of use of DME  Visit Diagnosis: Spastic  hemiplegia affecting left nondominant side, unspecified etiology ([place])  Muscle weakness (generalized)  Unsteadiness on feet  Other abnormalities of gait and mobility  Other lack of coordination     Problem List Patient Active Problem List   Diagnosis Date Noted  . Right arm pain 07/27/2019  .  Anaplastic astrocytoma of thalamus ([place]) 03/17/2019  . NF (neurofibromatosis) ([place]) 07/20/2015  . BPH (benign prostatic hyperplasia) 07/20/2015  . ED (erectile dysfunction) 07/20/2015  . Seborrheic keratoses 07/20/2015  . Cherry angioma 07/20/2015    Hall Busing, PT, DPT 06/20/2020, 6:05 PM  [facility]. [place], [place], [address] Phone: [phone]   Fax:  [phone]  Name: Aaron Espinoza MRN: [id number] Date of Birth: Apr 14, 1975

## 2020-06-21 NOTE — Therapy (Signed)
[place] [place], [place], [address] Phone: [phone]   Fax:  [phone]  Occupational Therapy Treatment  Patient Details  Name: Aaron Espinoza MRN: [id number] Date of Birth: 1975/04/16 Referring Provider (OT): Dr. Arlan Organ   Encounter Date: 06/20/2020   OT End of Session - 06/20/20 1715    Visit Number 4    Number of Visits 17    Date for OT Re-Evaluation 09/05/20    Authorization Type BCBS    Progress Note Due on Visit 10    OT Start Time 1700    OT Stop Time 1745    OT Time Calculation (min) 45 min    Activity Tolerance Patient tolerated treatment well;Patient limited by pain    Behavior During Therapy [facility] for tasks assessed/performed           Past Medical History:  Diagnosis Date  . BPH (benign prostatic hyperplasia) 07/20/2015  . Brain mass   . Cognitive dysfunction 01/2019  . Fatigue 01/2019  . Headache   . NF (neurofibromatosis) ([place]) 07/20/2015   NF1    Past Surgical History:  Procedure Laterality Date  . HAND SURGERY Left    pinky - tumor benign  . LEG SURGERY Left    inner thigh tumor - benign  . VENTRICULOSTOMY N/A 03/17/2019   Procedure: ENDOSCOPIC THIRD VENTRICULOSTOMY;  Surgeon: Vallarie Mare, MD;  Location: [place];  Service: Neurosurgery;  Laterality: N/A;  ENDOSCOPIC THIRD VENTRICULOSTOMY    There were no vitals filed for this visit.   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 No/denies             OT Treatments/Exercises (OP)  - 06/20/20 1750      Transfers   Comments OT problem-solved w/ pt and his spouse to determine effective positioning during sit-to-stand transitions in order to avoid discomfort/pain of the LUE      Neurological Re-education Exercises   Other Exercises 1 UE ranger w/ pt's elbow extended slightly past 90 used to facilitate active L shoulder flexion and extension; pt demo'd decreased activation, but was able to inconsistently achieve shoulder extension    Weight Bearing Position Seated    Seated with weight on forearm Pt completed weight-bearing exercises to L forearm while sitting EOM; due to difficulty weight shifting fully on to mat w/ forearm, OT positioned pillows on pt's L side which improved success. Pt reported initial pain/spasticity that resolved w/ repetition. OT also attempted to facilitate weight-bearing w/ weight on hand (wrist extended), but exercise was modified due to difficulty      Manual Therapy   Manual Therapy Passive ROM    Passive ROM OT-facilitated PROM of L wrist flexion and extension; discomfort improved w/ repetition and holding stretch at end range             OT Education - 06/20/20 1715    Education Details Education provided on spasticity, proprioception, and weight-bearing    Person(s) Educated Patient;Spouse   [employer]  Methods Explanation    Comprehension Verbalized understanding            OT Short Term Goals - 06/14/20 1545      OT SHORT TERM GOAL #1   Title Pt and spouse will be independent w/ initial HEP for LUE (self ROM/stretches)    Baseline No HEP at this time    Time 4    Period Weeks    Status On-going    Target Date 07/05/20      OT SHORT TERM GOAL #2   Title Pt will be able to don overhead/open-front shirt w/ Mod A    Baseline Dependent w/ UB dressing per pt report    Time 4    Period Weeks    Status On-going      OT SHORT TERM GOAL #3   Title Pt to verbalize understanding of compensatory strategies, including A/E prn, to  increase independence with cutting food and dressing (rocker knife, shoe buttons, etc)    Baseline Decreased knowledge of compensatory strategies    Time 4    Period Weeks    Status On-going      OT SHORT TERM GOAL #4   Title Pt to be independent with wear and care of L hand orthosis prn    Baseline No braces/orthoses at this time    Time 4    Period Weeks    Status On-going      OT SHORT TERM GOAL #5   Title Pt to verbalize understanding of visual scanning strategies and safety considerations due to lack of sensation L side    Baseline Decreaseed knowledge of safety strategies    Time 4    Period Weeks    Status On-going             OT Long Term Goals - 06/14/20 1545      OT LONG TERM GOAL #1   Title Pt will be able to complete UB/LB dressing w/ Min A    Baseline Per pt report, Dep w/ dressing    Time 8    Period Weeks    Status On-going      OT LONG TERM GOAL #2   Title Pt to demo use of LUE as gross assist for bilateral tasks and ADLs    Time 8    Period Weeks    Status On-going      OT LONG TERM GOAL #3   Title Pt to perform simple snack/meal prep w/ necessary DME/compensatory strategies prn at distant SPV level    Baseline Unable to complete meal prep tasks at this time    Time 8    Period Weeks    Status On-going      OT LONG TERM GOAL #4   Title Pt to attend to L side of body with transfers, functional ambulation, and scanning activities at least 75% of the time    Baseline L-sided inattention/visual field cut    Time 8    Period Weeks    Status On-going      OT LONG TERM GOAL #5   Title Pt to perform gross finger extension to 75% or greater to facilitate release of cylindrical objects    Baseline Decreased finger extension    Time 8    Period Weeks    Status On-going      OT LONG TERM GOAL #6   Title Pt to demo AROM of shoulder flexion to 40* for low level reaching during ADLs  Baseline No active L shoulder flexion at this time    Time 8     Period Weeks    Status On-going             Plan - 06/20/20 1745    Clinical Impression Statement OT facilitated weight bearing through RUE this session to reduce spasticity, strengthen weaker muscles, and facilitate movement in the LUE. Pt experienced difficulty tolerating weight-bearing position due to pain in the UE, which did slightly improve w/ repetition. OT also positioned pt's hand pronated on an elevated surface due to decreased elbow extension and was able to support the LUE in good alignment during exercise. UE ranger also used to facilitate movement, focusing on L shoulder flexion/extension w/ pt demo'ing increased consistency w/ extension compared w/ flexion.    OT Occupational Profile and History Detailed Assessment- Review of Records and additional review of physical, cognitive, psychosocial history related to current functional performance    Occupational performance deficits (Please refer to evaluation for details): ADL's;IADL's;Leisure;Social Participation    Body Structure / Function / Physical Skills ADL;Decreased knowledge of use of DME;Strength;Dexterity;GMC;Balance;Tone;UE functional use;Proprioception;Body mechanics;IADL;ROM;Vision;Mobility;Coordination;FMC;Muscle spasms;Pain;Sensation    Cognitive Skills Attention;Perception;Safety Awareness    Rehab Potential Fair   given diagnosis   Clinical Decision Making Multiple treatment options, significant modification of task necessary    Comorbidities Affecting Occupational Performance: May have comorbidities impacting occupational performance    Modification or Assistance to Complete Evaluation  Min-Moderate modification of tasks or assist with assess necessary to complete eval    OT Frequency 2x / week   May reduce frequency to 1x/week after first few weeks due to insurance VL   OT Duration 8 weeks    OT Treatment/Interventions Self-care/ADL training;Moist Heat;DME and/or AE instruction;Splinting;Therapeutic  activities;Therapeutic exercise;Cognitive remediation/compensation;Coping strategies training;Neuromuscular education;Functional Mobility Training;Passive range of motion;Visual/perceptual remediation/compensation;Energy conservation;Manual Therapy;Patient/family education;Electrical Stimulation;Aquatic Therapy;Balance training    Plan Introduce AE; visual scanning    Consulted and Agree with Plan of Care Patient;Family member/caregiver    Family Member Consulted Wife [name])           Patient will benefit from skilled therapeutic intervention in order to improve the following deficits and impairments:   Body Structure / Function / Physical Skills: ADL,Decreased knowledge of use of DME,Strength,Dexterity,GMC,Balance,Tone,UE functional use,Proprioception,Body mechanics,IADL,ROM,Vision,Mobility,Coordination,FMC,Muscle spasms,Pain,Sensation Cognitive Skills: Attention,Perception,Safety Awareness     Visit Diagnosis: Spastic hemiplegia affecting left nondominant side, unspecified etiology ([place])  Muscle weakness (generalized)  Other lack of coordination    Problem List Patient Active Problem List   Diagnosis Date Noted  . Right arm pain 07/27/2019  . 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 NF (neurofibromatosis) ([place]) 07/20/2015  . BPH (benign prostatic hyperplasia) 07/20/2015  . ED (erectile dysfunction) 07/20/2015  . Seborrheic keratoses 07/20/2015  . Cherry angioma 07/20/2015     Kathrine Cords, OTR/L, [place] 06/20/2020, 6:00 PM  [address] [place], [place], [address] Phone: [phone]   Fax:  [phone]  Name: COSBY PROBY MRN: [id number] Date of Birth: February 15, 1975

## 2020-06-22 ENCOUNTER — Ambulatory Visit: Payer: BC Managed Care – PPO | Admitting: Occupational Therapy

## 2020-06-22 ENCOUNTER — Encounter: Payer: Self-pay | Admitting: Internal Medicine

## 2020-06-22 ENCOUNTER — Other Ambulatory Visit: Payer: Self-pay

## 2020-06-22 ENCOUNTER — Ambulatory Visit: Payer: BC Managed Care – PPO

## 2020-06-22 DIAGNOSIS — R208 Other disturbances of skin sensation: Secondary | ICD-10-CM | POA: Diagnosis not present

## 2020-06-22 DIAGNOSIS — M6281 Muscle weakness (generalized): Secondary | ICD-10-CM

## 2020-06-22 DIAGNOSIS — R2681 Unsteadiness on feet: Secondary | ICD-10-CM

## 2020-06-22 DIAGNOSIS — R278 Other lack of coordination: Secondary | ICD-10-CM

## 2020-06-22 DIAGNOSIS — G8114 Spastic hemiplegia affecting left nondominant side: Secondary | ICD-10-CM | POA: Diagnosis not present

## 2020-06-22 DIAGNOSIS — R41842 Visuospatial deficit: Secondary | ICD-10-CM

## 2020-06-22 DIAGNOSIS — R2689 Other abnormalities of gait and mobility: Secondary | ICD-10-CM

## 2020-06-22 DIAGNOSIS — R414 Neurologic neglect syndrome: Secondary | ICD-10-CM | POA: Diagnosis not present

## 2020-06-22 NOTE — Therapy (Signed)
[address] [place], [place], [address] Phone: [phone]   Fax:  [phone]  Physical Therapy Treatment  Patient Details  Name: Aaron Espinoza MRN: [id number] Date of Birth: 04-20-1975 Referring Provider (PT): Ventura Sellers, MD   Encounter Date: 06/22/2020   PT End of Session - 06/22/20 1615    Visit Number 5    Date for PT Re-Evaluation 08/02/20    Authorization Type [employer] - 30 visits for year    PT Start Time 1536    PT Stop Time 1615    PT Time Calculation (min) 39 min    Equipment Utilized During Treatment Gait belt    Activity Tolerance Patient tolerated treatment well    Behavior During Therapy [facility] for tasks assessed/performed           Past Medical History:  Diagnosis Date  . BPH (benign prostatic hyperplasia) 07/20/2015  . Brain mass   . Cognitive dysfunction 01/2019  . Fatigue 01/2019  . Headache   . NF (neurofibromatosis) ([place]) 07/20/2015   NF1    Past Surgical History:  Procedure Laterality Date  . HAND SURGERY Left    pinky - tumor benign  . LEG SURGERY Left    inner thigh tumor - benign  . VENTRICULOSTOMY N/A 03/17/2019   Procedure: ENDOSCOPIC THIRD VENTRICULOSTOMY;  Surgeon: Vallarie Mare, MD;  Location: [place];  Service: Neurosurgery;  Laterality: N/A;  ENDOSCOPIC THIRD VENTRICULOSTOMY    There were no vitals filed for this visit.   Subjective Assessment - 06/22/20 1614    Subjective Doing okay. Some right shoulder strain sensation over past week. back of left knee discomfort    Patient Stated Goals get back to walking, be able to walk more efficiently and get more confident and improve balance.    Currently in Pain? Yes                             [facility] Adult PT Treatment/Exercise - 06/22/20 0001      Therapeutic Activites    Other Therapeutic Activities Lateral scooting x 2 laps along length of mat table min cues needed for sequencing. STS 10 x 3  with RUE supported anterior for 1st set , facilitation WS left, left foot blocked. pregait step fwd/bwd x2 reps, x 6 reps sets with SPC and L fopot and knee blocked.                    PT Short Term Goals - 06/07/20 1659      PT SHORT TERM GOAL #1   Title Independent with initial HEP with caregiver assist    Time 2    Period Weeks    Status New    Target Date 06/21/20      PT SHORT TERM GOAL #2   Title Pt will demonstrate improved independent anterior weight shift for improved ease with initiation of sit to stand transfers    Time 4    Period Weeks    Status New    Target Date 07/05/20      PT SHORT TERM GOAL #3   Title Pt to be able to complete TUG and attain initial score.    Time 2    Period Weeks    Status New    Target Date 06/21/20             PT Long Term Goals -  06/07/20 1700      PT LONG TERM GOAL #1   Title Independent with advanced HEP with caregiver assist    Time 8    Period Weeks    Status New    Target Date 08/02/20      PT LONG TERM GOAL #2   Title Pt will be able to scoot forward and laterally in sitting to assist with repositioning, pressure releife for prolonged sitting, and assist with ease of transfers.    Time 8    Period Weeks    Status New    Target Date 08/02/20      PT LONG TERM GOAL #3   Title Pt will demonstrate improved functional strength of LLE to at least 3+/5 to demonstrate improved active isolated movement.    Time 8    Period Weeks    Status New    Target Date 08/02/20      PT LONG TERM GOAL #4   Title Pt will be able to ambulate 100 ft with LRAD, no greater than CS, and without LOB  to facilitate safety with negotiation of home and community environments.    Time 8    Period Weeks    Target Date 08/02/20                 Plan - 06/22/20 1616    Clinical Impression Statement Aaron Espinoza tolerated session nicely today. Good carryover of anterior WS for STS and with  scooting with some cue sand A to bring left  side forward. After repeated practice able to complete lateral scooting with more efficient mvmts, cues required for foot placement and head hips relationship. Continues to do nicely with sit to stands with left foot and L knee blocked, cues to lean toward therapist for improved L WS. L knee discomfort likely d/t tightness with spasticity, decreased discomfort after repeated practice of standing activities.    PT Treatment/Interventions ADLs/Self Care Home Management;Electrical Stimulation;DME Instruction;Gait training;Neuromuscular re-education;Therapeutic exercise;Therapeutic activities;Functional mobility training;Patient/family education;Orthotic Fit/Training;Manual techniques;Energy conservation;Taping;Vestibular;Cryotherapy;Moist Heat    PT Next Visit Plan Plan to get a TUG score. BLE strengthening, balance and transfer training, gait training. May benefit from trial of AFO to improve ankle stability on the left.    Consulted and Agree with Plan of Care Patient;Family member/caregiver    Family Member Consulted Wife, Aaron Espinoza           Patient will benefit from skilled therapeutic intervention in order to improve the following deficits and impairments:  Abnormal gait,Decreased coordination,Difficulty walking,Pain,Impaired UE functional use,Increased muscle spasms,Impaired tone,Decreased balance,Decreased mobility,Decreased strength,Increased edema,Impaired sensation,Postural dysfunction,Decreased activity tolerance,Decreased knowledge of use of DME  Visit Diagnosis: Spastic hemiplegia affecting left nondominant side, unspecified etiology ([place])  Muscle weakness (generalized)  Other lack of coordination  Unsteadiness on feet  Other abnormalities of gait and mobility     Problem List Patient Active Problem List   Diagnosis Date Noted  . Right arm pain 07/27/2019  . 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 NF (neurofibromatosis) ([place]) 07/20/2015  . BPH (benign prostatic  hyperplasia) 07/20/2015  . ED (erectile dysfunction) 07/20/2015  . Seborrheic keratoses 07/20/2015  . Cherry angioma 07/20/2015    Hall Busing, PT, DPT 06/22/2020, 4:21 PM  [address] [place], [place], [address] Phone: [phone]   Fax:  [phone]  Name: Aaron Espinoza MRN: [id number] Date of Birth: [birth year]

## 2020-06-22 NOTE — Therapy (Signed)
[place] [place], [place], [address] Phone: [phone]   Fax:  [phone]  Occupational Therapy Treatment  Patient Details  Name: Aaron Espinoza MRN: [id number] Date of Birth: 07/07/1975 Referring Provider (OT): Dr. Arlan Organ   Encounter Date: 06/22/2020   OT End of Session - 06/22/20 1627    Visit Number 5    Number of Visits 17    Date for OT Re-Evaluation 09/05/20    Authorization Type BCBS    Progress Note Due on Visit 10    OT Start Time 1614    OT Stop Time 1657    OT Time Calculation (min) 43 min    Activity Tolerance Patient tolerated treatment well;Patient limited by pain    Behavior During Therapy [facility] for tasks assessed/performed           Past Medical History:  Diagnosis Date  . BPH (benign prostatic hyperplasia) 07/20/2015  . Brain mass   . Cognitive dysfunction 01/2019  . Fatigue 01/2019  . Headache   . NF (neurofibromatosis) ([place]) 07/20/2015   NF1    Past Surgical History:  Procedure Laterality Date  . HAND SURGERY Left    pinky - tumor benign  . LEG SURGERY Left    inner thigh tumor - benign  . VENTRICULOSTOMY N/A 03/17/2019   Procedure: ENDOSCOPIC THIRD VENTRICULOSTOMY;  Surgeon: Vallarie Mare, MD;  Location: [place];  Service: Neurosurgery;  Laterality: N/A;  ENDOSCOPIC THIRD VENTRICULOSTOMY    There were no vitals filed for this visit.   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 No/denies             OT Treatments/Exercises (OP) - 06/22/20 1740      Transfers   Comments  CGA w/ SPT from EOM to w/c without use of cane      ADLs   LB Dressing OT demo'd cross-legged method of donning bottoms. Pt able to don bottoms w/ Min A, requiring increased time and assist to fully thread LLE and orient shorts to then thread RLE. Also practiced doffing/donning shoes; Mod I (increased time) w/ doffing and dependent w/ donning both shoes      Neurological Re-education Exercises   Weight Bearing Position Seated    Seated with weight on forearm Attempted weight-bearing exercises to L forearm while sitting EOM w/ L hand pronated and positioned on half-wedge; activity was d/c due to painful muscle spasms. OT also attempted to facilitate elbow extension in weight bearing to shift slightly to contralateral side without success.      Manual Therapy   Manual Therapy Passive ROM    Passive ROM OT-facilitated gentle stretching of L elbow flexion/extension, forearm supination/pronation, wrist flexion/extension, and finger extension to neutral; increased muscle spasms during PROM             OT Education - 06/22/20 1739    Education Details Education provided on visuospatial deficit and likely impact positionally and functionally    Person(s) Educated Engineer, maintenance (IT)   Methods Explanation;Demonstration  Comprehension Verbalized understanding;Returned demonstration            OT Short Term Goals - 06/14/20 1545      OT SHORT TERM GOAL #1   Title Pt and spouse will be independent w/ initial HEP for LUE (self ROM/stretches)    Baseline No HEP at this time    Time 4    Period Weeks    Status On-going    Target Date 07/05/20      OT SHORT TERM GOAL #2   Title Pt will be able to don overhead/open-front shirt w/ Mod A    Baseline Dependent w/ UB dressing per pt report    Time 4    Period Weeks    Status On-going      OT SHORT TERM GOAL #3   Title Pt to verbalize understanding of compensatory strategies, including A/E prn, to increase independence with cutting food  and dressing (rocker knife, shoe buttons, etc)    Baseline Decreased knowledge of compensatory strategies    Time 4    Period Weeks    Status On-going      OT SHORT TERM GOAL #4   Title Pt to be independent with wear and care of L hand orthosis prn    Baseline No braces/orthoses at this time    Time 4    Period Weeks    Status On-going      OT SHORT TERM GOAL #5   Title Pt to verbalize understanding of visual scanning strategies and safety considerations due to lack of sensation L side    Baseline Decreaseed knowledge of safety strategies    Time 4    Period Weeks    Status On-going             OT Long Term Goals - 06/14/20 1545      OT LONG TERM GOAL #1   Title Pt will be able to complete UB/LB dressing w/ Min A    Baseline Per pt report, Dep w/ dressing    Time 8    Period Weeks    Status On-going      OT LONG TERM GOAL #2   Title Pt to demo use of LUE as gross assist for bilateral tasks and ADLs    Time 8    Period Weeks    Status On-going      OT LONG TERM GOAL #3   Title Pt to perform simple snack/meal prep w/ necessary DME/compensatory strategies prn at distant SPV level    Baseline Unable to complete meal prep tasks at this time    Time 8    Period Weeks    Status On-going      OT LONG TERM GOAL #4   Title Pt to attend to L side of body with transfers, functional ambulation, and scanning activities at least 75% of the time    Baseline L-sided inattention/visual field cut    Time 8    Period Weeks    Status On-going      OT LONG TERM GOAL #5   Title Pt to perform gross finger extension to 75% or greater to facilitate release of cylindrical objects    Baseline Decreased finger extension    Time 8    Period Weeks    Status On-going      OT LONG TERM GOAL #6   Title Pt to demo AROM of shoulder flexion to 40* for low level reaching during ADLs    Baseline  No active L shoulder flexion at this time    Time 8    Period Weeks    Status On-going              Plan - 06/22/20 1714    Clinical Impression Statement OT attempted weight-bearing and gentle PROM of LUE again this session with pt demo'ing increased muscle spasms, particularly during elbow and wrist flex/ext and forearm sup; OT was able to facilitate finger extension to neutral and maintain position during practice of functional dressing. Due to increased spasms, OT discussed potential benefit of following up w/ neuro-oncologist regarding options to help provide relief; pt and his wife were receptive. Pt also reported increased difficulty w/ using reacher for donning bottoms, so OT provided demonstration, w/ pt returning demo, of cross-legged LB dressing strategy.    OT Occupational Profile and History Detailed Assessment- Review of Records and additional review of physical, cognitive, psychosocial history related to current functional performance    Occupational performance deficits (Please refer to evaluation for details): ADL's;IADL's;Leisure;Social Participation    Body Structure / Function / Physical Skills ADL;Decreased knowledge of use of DME;Strength;Dexterity;GMC;Balance;Tone;UE functional use;Proprioception;Body mechanics;IADL;ROM;Vision;Mobility;Coordination;FMC;Muscle spasms;Pain;Sensation    Cognitive Skills Attention;Perception;Safety Awareness    Rehab Potential Fair   given diagnosis   Clinical Decision Making Multiple treatment options, significant modification of task necessary    Comorbidities Affecting Occupational Performance: May have comorbidities impacting occupational performance    Modification or Assistance to Complete Evaluation  Min-Moderate modification of tasks or assist with assess necessary to complete eval    OT Frequency 2x / week   May reduce frequency to 1x/week after first few weeks due to insurance VL   OT Duration 8 weeks    OT Treatment/Interventions Self-care/ADL training;Moist Heat;DME and/or AE instruction;Splinting;Therapeutic  activities;Therapeutic exercise;Cognitive remediation/compensation;Coping strategies training;Neuromuscular education;Functional Mobility Training;Passive range of motion;Visual/perceptual remediation/compensation;Energy conservation;Manual Therapy;Patient/family education;Electrical Stimulation;Aquatic Therapy;Balance training    Plan Visual scanning; scapular stabilization    Consulted and Agree with Plan of Care Patient;Family member/caregiver    Family Member Consulted Wife [name])           Patient will benefit from skilled therapeutic intervention in order to improve the following deficits and impairments:   Body Structure / Function / Physical Skills: ADL,Decreased knowledge of use of DME,Strength,Dexterity,GMC,Balance,Tone,UE functional use,Proprioception,Body mechanics,IADL,ROM,Vision,Mobility,Coordination,FMC,Muscle spasms,Pain,Sensation Cognitive Skills: Attention,Perception,Safety Awareness     Visit Diagnosis: Spastic hemiplegia affecting left nondominant side, unspecified etiology ([place])  Muscle weakness (generalized)  Other lack of coordination  Visuospatial deficit    Problem List Patient Active Problem List   Diagnosis Date Noted  . Right arm pain 07/27/2019  . 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 NF (neurofibromatosis) ([place]) 07/20/2015  . BPH (benign prostatic hyperplasia) 07/20/2015  . ED (erectile dysfunction) 07/20/2015  . Seborrheic keratoses 07/20/2015  . Cherry angioma 07/20/2015     Kathrine Cords, OTR/L, MSOT 06/22/2020, 5:52 PM  [place]. [place], [place], [address] Phone: [phone]   Fax:  [phone]  Name: Aaron Espinoza MRN: [id number] Date of Birth: 04-18-1975

## 2020-06-23 MED ORDER — BACLOFEN 10 MG PO TABS: 10.0000 mg | ORAL_TABLET | Freq: Three times a day (TID) | ORAL | 1 refills | Status: DC | PRN

## 2020-06-26 ENCOUNTER — Ambulatory Visit: Payer: BC Managed Care – PPO | Admitting: Occupational Therapy

## 2020-06-26 ENCOUNTER — Ambulatory Visit: Payer: BC Managed Care – PPO

## 2020-06-28 ENCOUNTER — Ambulatory Visit: Payer: BC Managed Care – PPO | Admitting: Occupational Therapy

## 2020-06-28 ENCOUNTER — Ambulatory Visit: Payer: BC Managed Care – PPO

## 2020-07-04 ENCOUNTER — Ambulatory Visit: Payer: BC Managed Care – PPO | Admitting: Occupational Therapy

## 2020-07-04 ENCOUNTER — Encounter: Payer: Self-pay | Admitting: Physical Therapy

## 2020-07-04 ENCOUNTER — Ambulatory Visit: Payer: BC Managed Care – PPO | Admitting: Physical Therapy

## 2020-07-04 ENCOUNTER — Other Ambulatory Visit: Payer: Self-pay

## 2020-07-04 DIAGNOSIS — R278 Other lack of coordination: Secondary | ICD-10-CM

## 2020-07-04 DIAGNOSIS — G8114 Spastic hemiplegia affecting left nondominant side: Secondary | ICD-10-CM | POA: Diagnosis not present

## 2020-07-04 DIAGNOSIS — R41842 Visuospatial deficit: Secondary | ICD-10-CM | POA: Diagnosis not present

## 2020-07-04 DIAGNOSIS — M6281 Muscle weakness (generalized): Secondary | ICD-10-CM

## 2020-07-04 DIAGNOSIS — R2689 Other abnormalities of gait and mobility: Secondary | ICD-10-CM | POA: Diagnosis not present

## 2020-07-04 DIAGNOSIS — R2681 Unsteadiness on feet: Secondary | ICD-10-CM

## 2020-07-04 DIAGNOSIS — R414 Neurologic neglect syndrome: Secondary | ICD-10-CM | POA: Diagnosis not present

## 2020-07-04 DIAGNOSIS — R208 Other disturbances of skin sensation: Secondary | ICD-10-CM | POA: Diagnosis not present

## 2020-07-04 NOTE — Therapy (Signed)
[place]. [place], [place], [address] Phone: [phone]   Fax:  [phone]  Physical Therapy Treatment  Patient Details  Name: Aaron Espinoza MRN: [id number] Date of Birth: Nov 08, 1975 Referring Provider (PT): Ventura Sellers, MD   Encounter Date: 07/04/2020   PT End of Session - 07/04/20 1807    Visit Number 6    Number of Visits 17    Date for PT Re-Evaluation 08/02/20    Authorization Type BCBS - 30 visits for year    PT Start Time 1608    PT Stop Time 1655    PT Time Calculation (min) 47 min    Activity Tolerance Patient tolerated treatment well    Behavior During Therapy [facility] for tasks assessed/performed           Past Medical History:  Diagnosis Date  . BPH (benign prostatic hyperplasia) 07/20/2015  . Brain mass   . Cognitive dysfunction 01/2019  . Fatigue 01/2019  . Headache   . NF (neurofibromatosis) ([place]) 07/20/2015   NF1    Past Surgical History:  Procedure Laterality Date  . HAND SURGERY Left    pinky - tumor benign  . LEG SURGERY Left    inner thigh tumor - benign  . VENTRICULOSTOMY N/A 03/17/2019   Procedure: ENDOSCOPIC THIRD VENTRICULOSTOMY;  Surgeon: Vallarie Mare, MD;  Location: [place];  Service: Neurosurgery;  Laterality: N/A;  ENDOSCOPIC THIRD VENTRICULOSTOMY    There were no vitals filed for this visit.   Subjective Assessment - 07/04/20 1628    Subjective Patient did not come in at all last week, "bad week" mentally and physically, reports a near fall this morning, "got scared"    Currently in Pain? No/denies              [facility] PT Assessment - 07/04/20 0001      Standardized Balance Assessment   Standardized Balance Assessment Timed Up and Go Test      Timed Up and Go Test   Normal TUG (seconds) 245    TUG Comments with cane and PT assist to advance the left LE                         [facility] Adult PT Treatment/Exercise - 07/04/20 0001       High Level Balance   High Level Balance Comments standing reaching with right hand      Lumbar Exercises: Seated   Other Seated Lumbar Exercises worked on sitting posture, elbows side to side, ball hits, partial crunches with bosu behind, use of mirror for him to see correct posture and to correct on own, he continued to need cuse, we worked on proper posture and holding more upright without rounded 30 seconds at a time    Other Seated Lumbar Exercises STS x5 with cues for RLE foot placement along with anterior weight shift                    PT Short Term Goals - 06/07/20 1659      PT SHORT TERM GOAL #1   Title Independent with initial HEP with caregiver assist    Time 2    Period Weeks    Status New    Target Date 06/21/20      PT SHORT TERM GOAL #2   Title Pt will demonstrate improved independent anterior weight shift for improved ease with initiation of sit  to stand transfers    Time 4    Period Weeks    Status New    Target Date 07/05/20      PT SHORT TERM GOAL #3   Title Pt to be able to complete TUG and attain initial score.    Time 2    Period Weeks    Status New    Target Date 06/21/20             PT Long Term Goals - 07/04/20 1810      PT LONG TERM GOAL #1   Title Independent with advanced HEP with caregiver assist    Status On-going      PT LONG TERM GOAL #2   Title Pt will be able to scoot forward and laterally in sitting to assist with repositioning, pressure releife for prolonged sitting, and assist with ease of transfers.    Status On-going      PT LONG TERM GOAL #3   Title Pt will demonstrate improved functional strength of LLE to at least 3+/5 to demonstrate improved active isolated movement.    Status On-going                 Plan - 07/04/20 1808    Clinical Impression Statement Pateitn was out the past week, his wife reports significant decline in motor and menatl function, I did the TUG which took 3 minutes and 50 seconds,  with PT advancing the left LE, this was reported to be a significant decrease in function by him and his wife, I worked a lot on sitting balance and core stability, this was my first time working with him and he struggled with the left LE going into clonus with any weight bearing.    PT Next Visit Plan try to advance with core and stabilty    Consulted and Agree with Plan of Care Patient    Family Member Consulted Wife, [name]           Patient will benefit from skilled therapeutic intervention in order to improve the following deficits and impairments:  Abnormal gait,Decreased coordination,Difficulty walking,Pain,Impaired UE functional use,Increased muscle spasms,Impaired tone,Decreased balance,Decreased mobility,Decreased strength,Increased edema,Impaired sensation,Postural dysfunction,Decreased activity tolerance,Decreased knowledge of use of DME  Visit Diagnosis: Spastic hemiplegia affecting left nondominant side, unspecified etiology ([place])  Muscle weakness (generalized)  Other lack of coordination  Unsteadiness on feet     Problem List Patient Active Problem List   Diagnosis Date Noted  . Right arm pain 07/27/2019  . 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 NF (neurofibromatosis) ([place]) 07/20/2015  . BPH (benign prostatic hyperplasia) 07/20/2015  . ED (erectile dysfunction) 07/20/2015  . Seborrheic keratoses 07/20/2015  . Cherry angioma 07/20/2015    Sumner Boast., PT 07/04/2020, 6:11 PM  [place]. [place], [place], [address] Phone: [phone]   Fax:  [phone]  Name: Aaron Espinoza MRN: [id number] Date of Birth: Dec 14, 1975

## 2020-07-04 NOTE — Therapy (Signed)
[facility]. [place], [place], [address] Phone: [phone]   Fax:  [phone]  Occupational Therapy Treatment  Patient Details  Name: Aaron Espinoza MRN: [id number] Date of Birth: 1976/02/01 Referring Provider (OT): Dr. Arlan Organ   Encounter Date: 07/04/2020   OT End of Session - 07/04/20 1745    Visit Number 6    Number of Visits 17    Date for OT Re-Evaluation 09/05/20    Authorization Type [employer]    Authorization Time Period VL: 30    Progress Note Due on Visit 10    OT Start Time 1700    OT Stop Time 1745    OT Time Calculation (min) 45 min    Activity Tolerance Patient tolerated treatment well    Behavior During Therapy [facility] for tasks assessed/performed           Past Medical History:  Diagnosis Date  . BPH (benign prostatic hyperplasia) 07/20/2015  . Brain mass   . Cognitive dysfunction 01/2019  . Fatigue 01/2019  . Headache   . NF (neurofibromatosis) ([place]) 07/20/2015   NF1    Past Surgical History:  Procedure Laterality Date  . HAND SURGERY Left    pinky - tumor benign  . LEG SURGERY Left    inner thigh tumor - benign  . VENTRICULOSTOMY N/A 03/17/2019   Procedure: ENDOSCOPIC THIRD VENTRICULOSTOMY;  Surgeon: Vallarie Mare, MD;  Location: [facility];  Service: Neurosurgery;  Laterality: N/A;  ENDOSCOPIC THIRD VENTRICULOSTOMY    There were no vitals filed for this visit.   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  No/denies    Pain Score --   Pt reports 6/10 pain in his LUE at sporadic times; unable to identify consistent triggers            [facility] OT Assessment - 07/04/20 1730      Tone   Assessment Location Left Upper Extremity      LUE Tone   LUE Tone Modified Ashworth      LUE Tone   Modified Ashworth Scale for Grading Hypertonia LUE More marked increase in muscle tone through most of the ROM, but affected part(s) easily moved             OT Treatments/Exercises (OP) - 07/04/20 1730      Bed Mobility   Bed Mobility Sit to Supine;Supine to Sit    Supine to Sit Moderate Assistance - Patient 50-74%   Required assist w/ trunk and LLE   Sit to Supine Minimal Assistance - Patient > 75%   Required assist w/ LLE and verbal cues for problem-solving during transition     Transfers   Comments CGA w/ SPT from EOM to w/c without use of cane   v/c needed to reposition RLE during pivot     Neurological Re-education Exercises   Other Exercises 1 OT-facilitated gentle passive stretching of L elbow extension in supine position  w/ LUE resting in position of comfort, slightly abducted and elevated off mat. Able to achieve 90% of elbow extension using slow, progressive stretching; pt able to tolerate activity w/out pain. Pt unable to achieve AROM of elbow flexion/ext. OT also facilitated passive stretching of L wrist extension (able to achieve 50% of ext) and composite finger extension (able to achieve neutral) in supine position w/ LUE supported; pt reported increased pain with this exercise that was more significant when stretching across multiple joints.             OT Education - 07/04/20 1700    Education Details Education provided on potential etiologies of pain as well as benefit of supine positioning when completing stretching/PROM at home. OT also discussed edema management strategies including elevating RUE for short periods and gentle massage distal to proximal.    Person(s) Educated  Patient;Spouse   Denise   Methods Explanation;Demonstration    Comprehension Verbalized understanding             OT Short Term Goals - 07/04/20 1745      OT SHORT TERM GOAL #1   Title Pt and spouse will be independent w/ initial HEP for LUE (self ROM/stretches)    Baseline No HEP at this time    Time 4    Period Weeks    Status Achieved   07/04/20 - reports consistent carryover of HEP   Target Date 07/05/20      OT SHORT TERM GOAL #2   Title Pt will be able to don overhead/open-front shirt w/ Mod A    Baseline Dependent w/ UB dressing per pt report    Time 4    Period Weeks    Status On-going      OT SHORT TERM GOAL #3   Title Pt to verbalize understanding of compensatory strategies, including A/E prn, to increase independence with cutting food and dressing (rocker knife, shoe buttons, etc)    Baseline Decreased knowledge of compensatory strategies    Time 4    Period Weeks    Status On-going      OT SHORT TERM GOAL #4   Title Pt to be independent with wear and care of L hand orthosis prn    Baseline No braces/orthoses at this time    Time 4    Period Weeks    Status On-going      OT SHORT TERM GOAL #5   Title Pt to verbalize understanding of visual scanning strategies and safety considerations due to lack of sensation L side    Baseline Decreaseed knowledge of safety strategies    Time 4    Period Weeks    Status On-going             OT Long Term Goals - 06/14/20 1545      OT LONG TERM GOAL #1   Title Pt will be able to complete UB/LB dressing w/ Min A    Baseline Per pt report, Dep w/ dressing    Time 8    Period Weeks    Status On-going      OT LONG TERM GOAL #2   Title Pt to demo use of LUE as gross assist for bilateral tasks and ADLs    Time 8    Period Weeks    Status On-going      OT LONG TERM GOAL #3   Title Pt to perform simple snack/meal prep w/ necessary DME/compensatory strategies prn at distant SPV level  Baseline Unable to complete  meal prep tasks at this time    Time 8    Period Weeks    Status On-going      OT LONG TERM GOAL #4   Title Pt to attend to L side of body with transfers, functional ambulation, and scanning activities at least 75% of the time    Baseline L-sided inattention/visual field cut    Time 8    Period Weeks    Status On-going      OT LONG TERM GOAL #5   Title Pt to perform gross finger extension to 75% or greater to facilitate release of cylindrical objects    Baseline Decreased finger extension    Time 8    Period Weeks    Status On-going      OT LONG TERM GOAL #6   Title Pt to demo AROM of shoulder flexion to 40* for low level reaching during ADLs    Baseline No active L shoulder flexion at this time    Time 8    Period Weeks    Status On-going             Plan - 07/04/20 1745    Clinical Impression Statement Compared w/ previous sessions, OT observed increased amount of painful sensations in LUE (evident via facial/verbal response to pain) at start of session, prior to any intervention. Pt and his wife report that he was prescribed Baclofen by his neuro-oncologist but that it has not seemed to help w/ pain in LUE. Due to persistent pain, OT positioned pt in supine w/ towel roll support under LUE and arm slightly abducted to facilitate PROM of elbow, wrist, and fingers with positive results. Pt experienced increased pain and/or tone when passively stretching across more than 1 joint and was able to achieve ROM [facility] of L elbow.    OT Occupational Profile and History Detailed Assessment- Review of Records and additional review of physical, cognitive, psychosocial history related to current functional performance    Occupational performance deficits (Please refer to evaluation for details): ADL's;IADL's;Leisure;Social Participation    Body Structure / Function / Physical Skills ADL;Decreased knowledge of use of DME;Strength;Dexterity;GMC;Balance;Tone;UE functional use;Proprioception;Body  mechanics;IADL;ROM;Vision;Mobility;Coordination;FMC;Muscle spasms;Pain;Sensation    Cognitive Skills Attention;Perception;Safety Awareness    Rehab Potential Fair   given diagnosis   Clinical Decision Making Multiple treatment options, significant modification of task necessary    Comorbidities Affecting Occupational Performance: May have comorbidities impacting occupational performance    Modification or Assistance to Complete Evaluation  Min-Moderate modification of tasks or assist with assess necessary to complete eval    OT Frequency 2x / week   May reduce frequency to 1x/week after first few weeks due to insurance VL   OT Duration 8 weeks    OT Treatment/Interventions Self-care/ADL training;Moist Heat;DME and/or AE instruction;Splinting;Therapeutic activities;Therapeutic exercise;Cognitive remediation/compensation;Coping strategies training;Neuromuscular education;Functional Mobility Training;Passive range of motion;Visual/perceptual remediation/compensation;Energy conservation;Manual Therapy;Patient/family education;Electrical Stimulation;Aquatic Therapy;Balance training    Plan Visual scanning; return to ADLs (compensatory strategies for dressing/eating)    Consulted and Agree with Plan of Care Patient;Family member/caregiver    Family Member Consulted Wife [name])           Patient will benefit from skilled therapeutic intervention in order to improve the following deficits and impairments:   Body Structure / Function / Physical Skills: ADL,Decreased knowledge of use of DME,Strength,Dexterity,GMC,Balance,Tone,UE functional use,Proprioception,Body mechanics,IADL,ROM,Vision,Mobility,Coordination,FMC,Muscle spasms,Pain,Sensation Cognitive Skills: Attention,Perception,Safety Awareness     Visit Diagnosis: Spastic hemiplegia affecting left nondominant side, unspecified etiology ([place])  Muscle  weakness (generalized)  Other lack of coordination    Problem List Patient Active Problem  List   Diagnosis Date Noted  . Right arm pain 07/27/2019  . 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 NF (neurofibromatosis) ([place]) 07/20/2015  . BPH (benign prostatic hyperplasia) 07/20/2015  . ED (erectile dysfunction) 07/20/2015  . Seborrheic keratoses 07/20/2015  . Cherry angioma 07/20/2015     Kathrine Cords, OTR/L, MSOT 07/04/2020, 5:45 PM  [place]. [place], [place], [address] Phone: [phone]   Fax:  [phone]  Name: AKI ABALOS MRN: [id number] Date of Birth: 09-13-1975

## 2020-07-06 ENCOUNTER — Ambulatory Visit: Payer: BC Managed Care – PPO | Attending: Internal Medicine | Admitting: Physical Therapy

## 2020-07-06 ENCOUNTER — Ambulatory Visit: Payer: BC Managed Care – PPO | Admitting: Occupational Therapy

## 2020-07-06 ENCOUNTER — Encounter: Payer: Self-pay | Admitting: Physical Therapy

## 2020-07-06 ENCOUNTER — Other Ambulatory Visit: Payer: Self-pay

## 2020-07-06 DIAGNOSIS — R41842 Visuospatial deficit: Secondary | ICD-10-CM

## 2020-07-06 DIAGNOSIS — R2681 Unsteadiness on feet: Secondary | ICD-10-CM | POA: Insufficient documentation

## 2020-07-06 DIAGNOSIS — R278 Other lack of coordination: Secondary | ICD-10-CM | POA: Diagnosis not present

## 2020-07-06 DIAGNOSIS — M6281 Muscle weakness (generalized): Secondary | ICD-10-CM | POA: Insufficient documentation

## 2020-07-06 DIAGNOSIS — G8114 Spastic hemiplegia affecting left nondominant side: Secondary | ICD-10-CM | POA: Insufficient documentation

## 2020-07-06 DIAGNOSIS — R2689 Other abnormalities of gait and mobility: Secondary | ICD-10-CM | POA: Insufficient documentation

## 2020-07-06 NOTE — Therapy (Signed)
[facility]. [place], [place], [address] Phone: [phone]   Fax:  [phone]  Physical Therapy Treatment  Patient Details  Name: CHRISTINE SCHIEFELBEIN MRN: [id number] Date of Birth: Dec 14, 1975 Referring Provider (PT): Ventura Sellers, MD   Encounter Date: 07/06/2020   PT End of Session - 07/06/20 1710    Visit Number 7    Date for PT Re-Evaluation 08/02/20    PT Start Time 1615    PT Stop Time 1655    PT Time Calculation (min) 40 min    Activity Tolerance Patient tolerated treatment well    Behavior During Therapy [facility] for tasks assessed/performed           Past Medical History:  Diagnosis Date  . BPH (benign prostatic hyperplasia) 07/20/2015  . Brain mass   . Cognitive dysfunction 01/2019  . Fatigue 01/2019  . Headache   . NF (neurofibromatosis) ([place]) 07/20/2015   NF1    Past Surgical History:  Procedure Laterality Date  . HAND SURGERY Left    pinky - tumor benign  . LEG SURGERY Left    inner thigh tumor - benign  . VENTRICULOSTOMY N/A 03/17/2019   Procedure: ENDOSCOPIC THIRD VENTRICULOSTOMY;  Surgeon: Vallarie Mare, MD;  Location: [facility];  Service: Neurosurgery;  Laterality: N/A;  ENDOSCOPIC THIRD VENTRICULOSTOMY    There were no vitals filed for this visit.   Subjective Assessment - 07/06/20 1626    Subjective Pt reports doing well today, states no pain    Currently in Pain? No/denies                             [facility] Adult PT Treatment/Exercise - 07/06/20 0001      Ambulation/Gait   Gait Comments ambulation with SPC and CGA x10 ft working on sequencing turns      Neuro Re-ed    Neuro Re-ed Details  standing weight shifts x10 with CGA working on LLE WB and upright posture      Lumbar Exercises: Seated   Other Seated Lumbar Exercises worked on sitting posture, elbows side to side, ball hits, partial crunches with green tb, use of mirror for him to see correct posture and  to correct on own, he continued to need cuse, we worked on proper posture and holding more upright without rounded 1 minute at a time    Other Seated Lumbar Exercises STS 4x5 with cues for RLE foot placement along with anterior weight shift; working on standing from progressively lower surface on raised mat table; scooting laterally down mat table x2                    PT Short Term Goals - 06/07/20 1659      PT SHORT TERM GOAL #1   Title Independent with initial HEP with caregiver assist    Time 2    Period Weeks    Status New    Target Date 06/21/20      PT SHORT TERM GOAL #2   Title Pt will demonstrate improved independent anterior weight shift for improved ease with initiation of sit to stand transfers    Time 4    Period Weeks    Status New    Target Date 07/05/20      PT SHORT TERM GOAL #3   Title Pt to be able to complete TUG and attain initial score.  Time 2    Period Weeks    Status New    Target Date 06/21/20             PT Long Term Goals - 07/04/20 1810      PT LONG TERM GOAL #1   Title Independent with advanced HEP with caregiver assist    Status On-going      PT LONG TERM GOAL #2   Title Pt will be able to scoot forward and laterally in sitting to assist with repositioning, pressure releife for prolonged sitting, and assist with ease of transfers.    Status On-going      PT LONG TERM GOAL #3   Title Pt will demonstrate improved functional strength of LLE to at least 3+/5 to demonstrate improved active isolated movement.    Status On-going                 Plan - 07/06/20 1711    Clinical Impression Statement Pt demos difficulty concentrating today. Required frequent verbal and visual cues with mirror to maintain upright posture. Working on anterior weight shifts and LE sequencing for STS from progressively lower surfaces. Core stab in sitting. Pt wife continues to report decline in motor and mental function. Tactile/verbal cues for  sequencing with ambulation especially with turns. CGA-minA for all standing activties.    PT Treatment/Interventions ADLs/Self Care Home Management;Electrical Stimulation;DME Instruction;Gait training;Neuromuscular re-education;Therapeutic exercise;Therapeutic activities;Functional mobility training;Patient/family education;Orthotic Fit/Training;Manual techniques;Energy conservation;Taping;Vestibular;Cryotherapy;Moist Heat    PT Next Visit Plan try to advance with core and stabilty    Consulted and Agree with Plan of Care Patient           Patient will benefit from skilled therapeutic intervention in order to improve the following deficits and impairments:  Abnormal gait,Decreased coordination,Difficulty walking,Pain,Impaired UE functional use,Increased muscle spasms,Impaired tone,Decreased balance,Decreased mobility,Decreased strength,Increased edema,Impaired sensation,Postural dysfunction,Decreased activity tolerance,Decreased knowledge of use of DME  Visit Diagnosis: Spastic hemiplegia affecting left nondominant side, unspecified etiology ([place])  Muscle weakness (generalized)  Other lack of coordination  Unsteadiness on feet     Problem List Patient Active Problem List   Diagnosis Date Noted  . Right arm pain 07/27/2019  . 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 NF (neurofibromatosis) ([place]) 07/20/2015  . BPH (benign prostatic hyperplasia) 07/20/2015  . ED (erectile dysfunction) 07/20/2015  . Seborrheic keratoses 07/20/2015  . Cherry angioma 07/20/2015   Amador Cunas, PT, DPT Donald Prose Demitris Pokorny 07/06/2020, 5:16 PM  [facility]. [place], [place], [address] Phone: [phone]   Fax:  [phone]  Name: ARDIT DANH MRN: [id number] Date of Birth: May 01, 1975

## 2020-07-07 NOTE — Therapy (Signed)
[place]. [place], [place], [address] Phone: [phone]   Fax:  [phone]  Occupational Therapy Treatment  Patient Details  Name: Aaron Espinoza MRN: [id number] Date of Birth: Feb 24, 1975 Referring Provider (OT): Dr. Arlan Organ   Encounter Date: 07/06/2020   OT End of Session - 07/06/20 1735    Visit Number 7    Number of Visits 17    Date for OT Re-Evaluation 09/05/20    Authorization Type BCBS    Authorization Time Period VL: 30    Progress Note Due on Visit 10    OT Start Time 1700    OT Stop Time 1745    OT Time Calculation (min) 45 min    Activity Tolerance Patient tolerated treatment well    Behavior During Therapy [facility] for tasks assessed/performed           Past Medical History:  Diagnosis Date  . BPH (benign prostatic hyperplasia) 07/20/2015  . Brain mass   . Cognitive dysfunction 01/2019  . Fatigue 01/2019  . Headache   . NF (neurofibromatosis) ([place]) 07/20/2015   NF1    Past Surgical History:  Procedure Laterality Date  . HAND SURGERY Left    pinky - tumor benign  . LEG SURGERY Left    inner thigh tumor - benign  . VENTRICULOSTOMY N/A 03/17/2019   Procedure: ENDOSCOPIC THIRD VENTRICULOSTOMY;  Surgeon: Vallarie Mare, MD;  Location: [facility];  Service: Neurosurgery;  Laterality: N/A;  ENDOSCOPIC THIRD VENTRICULOSTOMY    There were no vitals filed for this visit.   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 No/denies             OT Treatments/Exercises (OP) - 07/06/20 1745      Transfers   Sit to Stand 4:  Min guard    Comments Min A w/ SPT from EOM to w/c toward R side   verbal and visual cues needed to reposition RLE during pivot     ADLs   Adaptive Utensils OT modeled use of adaptive cutting board and rocker knife for improved independence w/ eating tasks; pt compled simulated cutting of food using putty w/ rocker knife at Mod I level w/out difficulty; handout administered to facilitate self-purchase of rocker knife    General Comments OT provided education on positioning of LUE when resting at home and facilitated positioning at start of session to improve body alignment during functional tasks          Therapeutic Activities: Completed tabletop visual scanning activity w/ pt instructed to find and cross out targets. In initial attempts, pt missed 75% of targets all on L side of page; OT introduced high contrast object positioned at the end of the page as a visual cue that pt had scanned the entire page; this improved accuracy to 100% and OT discussed implementing this technique during functional activities at home    Trail Making Test A activity completed to continue to facilitate visual scanning w/ pt taking greater than 3 minutes to complete task. OT provided blocking  of distracting stimuli, high contrast visual cue, gestural/verbal cues, and extended time to promote success.     OT Short Term Goals - 07/04/20 1745      OT SHORT TERM GOAL #1   Title Pt and spouse will be independent w/ initial HEP for LUE (self ROM/stretches)    Baseline No HEP at this time    Time 4    Period Weeks    Status Achieved   07/04/20 - reports consistent carryover of HEP   Target Date 07/05/20      OT SHORT TERM GOAL #2   Title Pt will be able to don overhead/open-front shirt w/ Mod A    Baseline Dependent w/ UB dressing per pt report    Time 4    Period Weeks    Status On-going      OT SHORT TERM GOAL #3   Title Pt to verbalize understanding of compensatory strategies, including A/E prn, to  increase independence with cutting food and dressing (rocker knife, shoe buttons, etc)    Baseline Decreased knowledge of compensatory strategies    Time 4    Period Weeks    Status On-going      OT SHORT TERM GOAL #4   Title Pt to be independent with wear and care of L hand orthosis prn    Baseline No braces/orthoses at this time    Time 4    Period Weeks    Status On-going      OT SHORT TERM GOAL #5   Title Pt to verbalize understanding of visual scanning strategies and safety considerations due to lack of sensation L side    Baseline Decreaseed knowledge of safety strategies    Time 4    Period Weeks    Status On-going             OT Long Term Goals - 06/14/20 1545      OT LONG TERM GOAL #1   Title Pt will be able to complete UB/LB dressing w/ Min A    Baseline Per pt report, Dep w/ dressing    Time 8    Period Weeks    Status On-going      OT LONG TERM GOAL #2   Title Pt to demo use of LUE as gross assist for bilateral tasks and ADLs    Time 8    Period Weeks    Status On-going      OT LONG TERM GOAL #3   Title Pt to perform simple snack/meal prep w/ necessary DME/compensatory strategies prn at distant SPV level    Baseline Unable to complete meal prep tasks at this time    Time 8    Period Weeks    Status On-going      OT LONG TERM GOAL #4   Title Pt to attend to L side of body with transfers, functional ambulation, and scanning activities at least 75% of the time    Baseline L-sided inattention/visual field cut    Time 8    Period Weeks    Status On-going      OT LONG TERM GOAL #5   Title Pt to perform gross finger extension to 75% or greater to facilitate release of cylindrical objects    Baseline Decreased finger extension    Time 8    Period Weeks    Status On-going      OT LONG TERM GOAL #6   Title Pt to demo AROM of shoulder flexion  to 40* for low level reaching during ADLs    Baseline No active L shoulder flexion at this time    Time 8     Period Weeks    Status On-going             Plan - 07/07/20 0939    Clinical Impression Statement Pt appeared more fatigued this session, exhibiting decreased focused attention and presenting downcast. OT also observed increased erythema on palmar side of L hand and encouraged pt/spouse to follow-up w/ MD prn; pt was receptive. OT also positioned LUE in adduction w/ elbow flexed at 90* and propped on pillows to facilitate good body alignment during therapeutic activities. Once positioned, OT introduced compensatory strategies for cutting food and pt was able to complete activity w/out difficulty. OT also completed screening for visual scanning due to previously identified L field cut; success improved w/ implementation of compensatory strategies and OT encouraged pt to incorporate strategies at home due to pt's report that he often "misses" food positioned on his L side.    OT Occupational Profile and History Detailed Assessment- Review of Records and additional review of physical, cognitive, psychosocial history related to current functional performance    Occupational performance deficits (Please refer to evaluation for details): ADL's;IADL's;Leisure;Social Participation    Body Structure / Function / Physical Skills ADL;Decreased knowledge of use of DME;Strength;Dexterity;GMC;Balance;Tone;UE functional use;Proprioception;Body mechanics;IADL;ROM;Vision;Mobility;Coordination;FMC;Muscle spasms;Pain;Sensation    Cognitive Skills Attention;Perception;Safety Awareness    Rehab Potential Fair   given diagnosis   Clinical Decision Making Multiple treatment options, significant modification of task necessary    Comorbidities Affecting Occupational Performance: May have comorbidities impacting occupational performance    Modification or Assistance to Complete Evaluation  Min-Moderate modification of tasks or assist with assess necessary to complete eval    OT Frequency 2x / week   May reduce frequency to  1x/week after first few weeks due to insurance VL   OT Duration 8 weeks    OT Treatment/Interventions Self-care/ADL training;Moist Heat;DME and/or AE instruction;Splinting;Therapeutic activities;Therapeutic exercise;Cognitive remediation/compensation;Coping strategies training;Neuromuscular education;Functional Mobility Training;Passive range of motion;Visual/perceptual remediation/compensation;Energy conservation;Manual Therapy;Patient/family education;Electrical Stimulation;Aquatic Therapy;Balance training    Plan Continue w/ visual scanning compensatory strategies; LUE ROM in supine    Consulted and Agree with Plan of Care Patient;Family member/caregiver    Family Member Consulted Wife [name])           Patient will benefit from skilled therapeutic intervention in order to improve the following deficits and impairments:   Body Structure / Function / Physical Skills: ADL,Decreased knowledge of use of DME,Strength,Dexterity,GMC,Balance,Tone,UE functional use,Proprioception,Body mechanics,IADL,ROM,Vision,Mobility,Coordination,FMC,Muscle spasms,Pain,Sensation Cognitive Skills: Attention,Perception,Safety Awareness     Visit Diagnosis: Spastic hemiplegia affecting left nondominant side, unspecified etiology ([place])  Visuospatial deficit  Other lack of coordination    Problem List Patient Active Problem List   Diagnosis Date Noted  . Right arm pain 07/27/2019  . 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 NF (neurofibromatosis) ([place]) 07/20/2015  . BPH (benign prostatic hyperplasia) 07/20/2015  . ED (erectile dysfunction) 07/20/2015  . Seborrheic keratoses 07/20/2015  . Cherry angioma 07/20/2015     Kathrine Cords, OTR/L, MSOT 07/07/2020, 11:23 AM  [place]. [place], [place], [address] Phone: [phone]   Fax:  [phone]  Name: Aaron Espinoza MRN: [id number] Date of Birth: 08/01/1975

## 2020-07-10 ENCOUNTER — Ambulatory Visit: Payer: BC Managed Care – PPO

## 2020-07-10 ENCOUNTER — Ambulatory Visit: Payer: BC Managed Care – PPO | Admitting: Occupational Therapy

## 2020-07-11 ENCOUNTER — Other Ambulatory Visit: Payer: Self-pay | Admitting: Internal Medicine

## 2020-07-11 MED ORDER — CYCLOBENZAPRINE HCL 5 MG PO TABS: 5.0000 mg | ORAL_TABLET | Freq: Three times a day (TID) | ORAL | 0 refills | Status: DC | PRN

## 2020-07-12 ENCOUNTER — Other Ambulatory Visit: Payer: Self-pay

## 2020-07-12 ENCOUNTER — Ambulatory Visit: Payer: BC Managed Care – PPO

## 2020-07-12 ENCOUNTER — Encounter: Payer: Self-pay | Admitting: Occupational Therapy

## 2020-07-12 ENCOUNTER — Ambulatory Visit: Payer: BC Managed Care – PPO | Admitting: Occupational Therapy

## 2020-07-12 DIAGNOSIS — R41842 Visuospatial deficit: Secondary | ICD-10-CM | POA: Diagnosis not present

## 2020-07-12 DIAGNOSIS — R2689 Other abnormalities of gait and mobility: Secondary | ICD-10-CM

## 2020-07-12 DIAGNOSIS — G8114 Spastic hemiplegia affecting left nondominant side: Secondary | ICD-10-CM | POA: Diagnosis not present

## 2020-07-12 DIAGNOSIS — M6281 Muscle weakness (generalized): Secondary | ICD-10-CM | POA: Diagnosis not present

## 2020-07-12 DIAGNOSIS — R2681 Unsteadiness on feet: Secondary | ICD-10-CM | POA: Diagnosis not present

## 2020-07-12 DIAGNOSIS — R278 Other lack of coordination: Secondary | ICD-10-CM | POA: Diagnosis not present

## 2020-07-12 NOTE — Therapy (Signed)
[place]. [place], [place], [address] Phone: [phone]   Fax:  [phone]  Physical Therapy Treatment  Patient Details  Name: Aaron Espinoza MRN: [id number] Date of Birth: [birth year] Referring Provider (PT): Ventura Sellers, MD   Encounter Date: 07/12/2020   PT End of Session - 07/12/20 1821    Visit Number 8    Date for PT Re-Evaluation 08/02/20    Authorization Type BCBS - 30 visits for year    PT Start Time 1730    PT Stop Time 1815    PT Time Calculation (min) 45 min    Equipment Utilized During Treatment Gait belt    Activity Tolerance Patient tolerated treatment well    Behavior During Therapy [facility] for tasks assessed/performed           Past Medical History:  Diagnosis Date  . BPH (benign prostatic hyperplasia) 07/20/2015  . Brain mass   . Cognitive dysfunction 01/2019  . Fatigue 01/2019  . Headache   . NF (neurofibromatosis) ([place]) 07/20/2015   NF1    Past Surgical History:  Procedure Laterality Date  . HAND SURGERY Left    pinky - tumor benign  . LEG SURGERY Left    inner thigh tumor - benign  . VENTRICULOSTOMY N/A 03/17/2019   Procedure: ENDOSCOPIC THIRD VENTRICULOSTOMY;  Surgeon: Vallarie Mare, MD;  Location: [facility];  Service: Neurosurgery;  Laterality: N/A;  ENDOSCOPIC THIRD VENTRICULOSTOMY    There were no vitals filed for this visit.   Subjective Assessment - 07/12/20 1821    Subjective Tired today. 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    Patient Stated Goals get back to walking, be able to walk more efficiently and get more confident and improve balance.    Currently in Pain? No/denies                             [facility] Adult PT Treatment/Exercise -  07/12/20 0001      Bed Mobility   Bed Mobility --   trialed log roll to right sidelying to sit with assist needed/reminders provided to bring left affected side over (PT assist provided for arm posiitoning). But once in right sidelying able to get into short sitting more smoothly.     Ambulation/Gait   Gait Comments ambulation with SPC and CGA 2 x10 ft working on sequencing turns   slow, winded post     Neuro Re-ed    Neuro Re-ed Details  standing weight shifts x10 with CGA working on LLE WB and upright posture. Seated scap squeezes x 10 in high perch sitting position to get more upright posture. Seated partial sit ups form stacked pillows x 10 with arm supported.      Lumbar Exercises: Seated   Other Seated Lumbar Exercises worked on sitting posture, elbows side to side, partial crunches, use of mirror for him to see correct posture and to correct on own, he continued to need cuse, we worked on proper posture and holding more upright without rounded 1 minute at a time    Other Seated Lumbar Exercises STS 4x5 with cues for RLE foot placement along with anterior weight shift; working on standing from progressively lower  surface on raised mat table; scooting laterally down mat table x2      Knee/Hip Exercises: Supine   Bridges 10 reps;Both   PT assist and occasional approximation/distraction on LLE to facilitate elevation.   Other Supine Knee/Hip Exercises attempted hooklying hip ABD/ADD - unable to complete indep with trace adductor contraction.                  PT Education - 07/12/20 1821    Education Details Discussed addition of supine bridges to HEP            PT Short Term Goals - 06/07/20 1659      PT SHORT TERM GOAL #1   Title Independent with initial HEP with caregiver assist    Time 2    Period Weeks    Status New    Target Date 06/21/20      PT SHORT TERM GOAL #2   Title Pt will demonstrate improved independent anterior weight shift for improved ease with  initiation of sit to stand transfers    Time 4    Period Weeks    Status New    Target Date 07/05/20      PT SHORT TERM GOAL #3   Title Pt to be able to complete TUG and attain initial score.    Time 2    Period Weeks    Status New    Target Date 06/21/20             PT Long Term Goals - 07/04/20 1810      PT LONG TERM GOAL #1   Title Independent with advanced HEP with caregiver assist    Status On-going      PT LONG TERM GOAL #2   Title Pt will be able to scoot forward and laterally in sitting to assist with repositioning, pressure releife for prolonged sitting, and assist with ease of transfers.    Status On-going      PT LONG TERM GOAL #3   Title Pt will demonstrate improved functional strength of LLE to at least 3+/5 to demonstrate improved active isolated movement.    Status On-going                 Plan - 07/12/20 1822    Clinical Impression Statement Pt with improved concentration today compared to previous session but did seem a bit more downcast with downward gaze and reports of fatigue. Continued work on anterior weight shifting. He was able to tolerate added distance traveled with [facility] today with increased time and intermittent min A for LE advancement towards the end. Improved sequencing with turns only with increased processing time provided. Added some supine bridging end of session with good engagement of posterior chain, minimal active isolated adductor motion with attempt of adduction in hooklying.    PT Treatment/Interventions ADLs/Self Care Home Management;Electrical Stimulation;DME Instruction;Gait training;Neuromuscular re-education;Therapeutic exercise;Therapeutic activities;Functional mobility training;Patient/family education;Orthotic Fit/Training;Manual techniques;Energy conservation;Taping;Vestibular;Cryotherapy;Moist Heat    PT Next Visit Plan try to advance with core and stabilty    Consulted and Agree with Plan of Care Patient            Patient will benefit from skilled therapeutic intervention in order to improve the following deficits and impairments:  Abnormal gait,Decreased coordination,Difficulty walking,Pain,Impaired UE functional use,Increased muscle spasms,Impaired tone,Decreased balance,Decreased mobility,Decreased strength,Increased edema,Impaired sensation,Postural dysfunction,Decreased activity tolerance,Decreased knowledge of use of DME  Visit Diagnosis: Spastic hemiplegia affecting left nondominant side, unspecified etiology ([place])  Other lack of coordination  Muscle  weakness (generalized)  Unsteadiness on feet  Other abnormalities of gait and mobility     Problem List Patient Active Problem List   Diagnosis Date Noted  . Right arm pain 07/27/2019  . 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 NF (neurofibromatosis) ([place]) 07/20/2015  . BPH (benign prostatic hyperplasia) 07/20/2015  . ED (erectile dysfunction) 07/20/2015  . Seborrheic keratoses 07/20/2015  . Cherry angioma 07/20/2015    Hall Busing, PT, DPT 07/12/2020, 6:29 PM  [place]. [place], [place], [address] Phone: [phone]   Fax:  [phone]  Name: WITT PLITT MRN: [id number] Date of Birth: 12-23-1975

## 2020-07-15 NOTE — Therapy (Signed)
[facility] [place], [place], [address] Phone: [phone]   Fax:  [phone]  Occupational Therapy Treatment  Patient Details  Name: Aaron Espinoza MRN: [id number] Date of Birth: 1975-12-22 Referring Provider (OT): Dr. Arlan Organ   Encounter Date: 07/12/2020   OT End of Session - 07/12/20 1627 Visit Number 8  Number of Visits 17  Date for OT Re-Evaluation 09/05/2020  Authorization Type BCBS  Authorization Time Period VL: 30  Progress Note Due on Visit 10  OT Start Time 1620  OT Stop Time 1700  OT Time Calculation (min) 40 min  Activity Tolerance Patient tolerated treatment well  Behavior During Therapy [facility] for tasks assessed/performed     Past Medical History:  Diagnosis Date   BPH (benign prostatic hyperplasia) 07/20/2015   Brain mass    Cognitive dysfunction 01/2019   Fatigue 01/2019   Headache    NF (neurofibromatosis) ([place]) 07/20/2015   NF1    Past Surgical History:  Procedure Laterality Date   HAND SURGERY Left    pinky - tumor benign   LEG SURGERY Left    inner thigh tumor - benign   VENTRICULOSTOMY N/A 03/17/2019   Procedure: ENDOSCOPIC THIRD VENTRICULOSTOMY;  Surgeon: Vallarie Mare, MD;  Location: [facility];  Service: Neurosurgery;  Laterality: N/A;  ENDOSCOPIC THIRD VENTRICULOSTOMY    There were no vitals filed for this visit.   Subjective Assessment - 07/12/20 1626 Subjective  "My energy level feel low today"  Patient is accompanied by: Family memberPatient is accompanied by:Marland Kitchen Family member. The comment is [name]). 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 No/denies     OT Treatments/Exercises (OP) - 07/12/20 1700 Pt  transitioned from sitting EOM to supine w/ Min A, requiring assist w/ problem-solving/ideation and motor planning, as well as extended time. Completed transition from supine to sitting EOM w/ Mod A, requiring assist w/ trunk and LLE, and verbal cues for RUE positioning.  OT-facilitated gentle passive stretching of L elbow extension and flexion in supine position w/ LUE resting in position of comfort, slightly abducted and elevated off mat (pillow for support underneath); able to achieve 90% of elbow extension using slow, progressive stretching; pt able to tolerate activity w/out pain  OT also facilitated passive stretching of L wrist extension (able to achieve 25% of ext) in supine position w/ LUE supported; pt experienced increased frequency of muscle spasms w/ wrist extension stretch  While in supine, OT attempted to facilitate contraction of biceps to maintain L elbow flexion w/ elbow positioned at 90 degrees (gravity-eliminated) or complete elbow flexion/shoulder internal rotation in gravity-assisted position; no observable/palpated contraction     OT Short Term Goals - 07/04/20 1745       OT SHORT TERM GOAL #1   Title Pt and spouse will be independent w/ initial HEP for LUE (self ROM/stretches)    Baseline No HEP at this time    Time 4    Period Weeks    Status Achieved   07/04/20 - reports consistent carryover of HEP   Target Date 07/05/20      OT SHORT TERM GOAL #2   Title Pt will be able to don overhead/open-front shirt w/ Mod A  Baseline Dependent w/ UB dressing per pt report    Time 4    Period Weeks    Status On-going      OT SHORT TERM GOAL #3   Title Pt to verbalize understanding of compensatory strategies, including A/E prn, to increase independence with cutting food and dressing (rocker knife, shoe buttons, etc)    Baseline Decreased knowledge of compensatory strategies    Time 4    Period Weeks    Status On-going      OT SHORT TERM GOAL #4   Title Pt to be  independent with wear and care of L hand orthosis prn    Baseline No braces/orthoses at this time    Time 4    Period Weeks    Status On-going      OT SHORT TERM GOAL #5   Title Pt to verbalize understanding of visual scanning strategies and safety considerations due to lack of sensation L side    Baseline Decreaseed knowledge of safety strategies    Time 4    Period Weeks    Status On-going               OT Long Term Goals - 06/14/20 1545       OT LONG TERM GOAL #1   Title Pt will be able to complete UB/LB dressing w/ Min A    Baseline Per pt report, Dep w/ dressing    Time 8    Period Weeks    Status On-going      OT LONG TERM GOAL #2   Title Pt to demo use of LUE as gross assist for bilateral tasks and ADLs    Time 8    Period Weeks    Status On-going      OT LONG TERM GOAL #3   Title Pt to perform simple snack/meal prep w/ necessary DME/compensatory strategies prn at distant SPV level    Baseline Unable to complete meal prep tasks at this time    Time 8    Period Weeks    Status On-going      OT LONG TERM GOAL #4   Title Pt to attend to L side of body with transfers, functional ambulation, and scanning activities at least 75% of the time    Baseline L-sided inattention/visual field cut    Time 8    Period Weeks    Status On-going      OT LONG TERM GOAL #5   Title Pt to perform gross finger extension to 75% or greater to facilitate release of cylindrical objects    Baseline Decreased finger extension    Time 8    Period Weeks    Status On-going      OT LONG TERM GOAL #6   Title Pt to demo AROM of shoulder flexion to 40* for low level reaching during ADLs    Baseline No active L shoulder flexion at this time    Time 8    Period Weeks    Status On-going              Plan - 07/12/20 1627 Clinical Impression Statement Pt's neuro-oncologist d/c baclofen and prescribed flexeril, which (per pt report and observation), seems to be more beneficial.  Due to this, OT returned to facilitation of LUE PROM/stretching w/ less disruption caused by muscle spasms and pain. During transition from sitting EOM to supine, pt demo'd decreased ideation/motor planning, requiring verbal cues and extended time to complete transition  successfully. Pt also appeared fatigued this session, similarly to previous session. Due to positive results during PROM and decreased pain of LUE, OT discussed exploration of resting hand orthosis for LUE to facilitate functional alignment and prevent deformity; pt was receptive.  OT Occupational Profile and History Detailed Assessment- Review of Records and additional review of physical, cognitive, psychosocial history related to current functional performance  Occupational performance deficits (Please refer to evaluation for details): ADL's; IADL's; Leisure; Social Participation  Pt will benefit from skilled therapeutic intervention in order to improve on the following performance deficits Body Structure / Function / Physical Skills; Cognitive Skills  Body Structure / Function / Physical Skills ADL; Decreased knowledge of use of DME; Strength; Dexterity; [place]; Balance; Tone; UE functional use; Proprioception; Body mechanics; IADL; ROM; Vision; Mobility; Coordination; [place]; Muscle spasms; Pain; Sensation  Cognitive Skills Attention; Perception; Safety Awareness  Rehab Potential [place]. The comment is given diagnosis. Taken on 07/12/20 1627  Clinical Decision Making Multiple treatment options, significant modification of task necessary  Comorbidities Affecting Occupational Performance: May have comorbidities impacting occupational performance  Modification or Assistance to Complete Evaluation  Min-Moderate modification of tasks or assist with assess necessary to complete eval  OT Frequency 2x / weekOT Frequency. 2x / week. The comment is May reduce frequency to 1x/week after first few weeks due to insurance VL. Taken on  07/12/20 1627  OT Duration 8 weeks  OT Treatment/Interventions Self-care/ADL training; Moist Heat; DME and/or AE instruction; Splinting; Therapeutic activities; Therapeutic exercise; Cognitive remediation/compensation; Coping strategies training; Neuromuscular education; Functional Mobility Training; Passive range of motion; Visual/perceptual remediation/compensation; Energy conservation; Manual Therapy; Patient/family education; Printmaker; Aquatic Therapy; Balance training  Plan Transferring up from floor; LUE use (support during functional tasks)  Consulted and Agree with Plan of Care Patient; Family member/caregiver  Family Member Consulted [name])     Patient will benefit from skilled therapeutic intervention in order to improve the following deficits and impairments:   Body Structure / Function / Physical Skills: ADL, Decreased knowledge of use of DME, Strength, Dexterity, GMC, Balance, Tone, UE functional use, Proprioception, Body mechanics, IADL, ROM, Vision, Mobility, Coordination, FMC, Muscle spasms, Pain, Sensation Cognitive Skills: Attention, Perception, Safety Awareness     Visit Diagnosis: Spastic hemiplegia affecting left nondominant side, unspecified etiology ([place])  Other lack of coordination  Muscle weakness (generalized)    Problem List Patient Active Problem List   Diagnosis Date Noted   Right arm pain 07/27/2019   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 [place], [place], [address] Phone: [phone]   Fax:  [phone]  Name: Aaron Espinoza MRN: [id number] Date of Birth: 1976-02-03

## 2020-07-17 ENCOUNTER — Other Ambulatory Visit: Payer: Self-pay

## 2020-07-17 ENCOUNTER — Other Ambulatory Visit: Payer: Self-pay | Admitting: Internal Medicine

## 2020-07-17 ENCOUNTER — Ambulatory Visit: Payer: BC Managed Care – PPO

## 2020-07-17 ENCOUNTER — Ambulatory Visit: Payer: BC Managed Care – PPO | Admitting: Occupational Therapy

## 2020-07-17 ENCOUNTER — Encounter: Payer: Self-pay | Admitting: Occupational Therapy

## 2020-07-17 DIAGNOSIS — R41842 Visuospatial deficit: Secondary | ICD-10-CM | POA: Diagnosis not present

## 2020-07-17 DIAGNOSIS — R2681 Unsteadiness on feet: Secondary | ICD-10-CM | POA: Diagnosis not present

## 2020-07-17 DIAGNOSIS — G8114 Spastic hemiplegia affecting left nondominant side: Secondary | ICD-10-CM

## 2020-07-17 DIAGNOSIS — M6281 Muscle weakness (generalized): Secondary | ICD-10-CM

## 2020-07-17 DIAGNOSIS — R2689 Other abnormalities of gait and mobility: Secondary | ICD-10-CM | POA: Diagnosis not present

## 2020-07-17 DIAGNOSIS — R278 Other lack of coordination: Secondary | ICD-10-CM | POA: Diagnosis not present

## 2020-07-17 NOTE — Therapy (Signed)
[place]. [place], [place], [address] Phone: [phone]   Fax:  [phone]  Physical Therapy Treatment  Patient Details  Name: LASON EVELAND MRN: [id number] Date of Birth: July 27, 1975 Referring Provider (PT): Ventura Sellers, MD   Encounter Date: 07/17/2020   PT End of Session - 07/17/20 1755     Visit Number 9    Date for PT Re-Evaluation 08/02/20    PT Start Time 3154    PT Stop Time 0086    PT Time Calculation (min) 45 min    Equipment Utilized During Treatment Gait belt    Activity Tolerance Patient tolerated treatment well;Patient limited by fatigue    Behavior During Therapy [facility] for tasks assessed/performed             Past Medical History:  Diagnosis Date   BPH (benign prostatic hyperplasia) 07/20/2015   Brain mass    Cognitive dysfunction 01/2019   Fatigue 01/2019   Headache    NF (neurofibromatosis) ([place]) 07/20/2015   NF1    Past Surgical History:  Procedure Laterality Date   HAND SURGERY Left    pinky - tumor benign   LEG SURGERY Left    inner thigh tumor - benign   VENTRICULOSTOMY N/A 03/17/2019   Procedure: ENDOSCOPIC THIRD VENTRICULOSTOMY;  Surgeon: Vallarie Mare, MD;  Location: [facility];  Service: Neurosurgery;  Laterality: N/A;  ENDOSCOPIC THIRD VENTRICULOSTOMY    There were no vitals filed for this visit.   Subjective Assessment - 07/17/20 1727     Subjective "Better than I was before" practice fall reovery in OT and "that took it all t of me" but "better now"    Currently in Pain? No/denies                               [facility] Adult PT Treatment/Exercise - 07/17/20 0001       Ambulation/Gait   Gait Comments ambulation with SPC and CGA 2 x10 ft working on sequencing turns - no cues needed for turns today.   slow, winded post     Neuro Re-ed    Neuro Re-ed Details  Standing with SPC and CGA x 1 on the right, CGA to min A at LLE for standing step  taps to 2" step with right 10 x 2. CS/CGA x1 on right and min to mod A for repositioning foot at neutral cone kicks x 10. . Seated scap squeezes x 10 in high perch sitting position to get more upright posture.      Lumbar Exercises: Seated   Other Seated Lumbar Exercises seated balance with multidirectional reaches seated on dynadisc 15x, 10 x.                      PT Short Term Goals - 06/07/20 1659       PT SHORT TERM GOAL #1   Title Independent with initial HEP with caregiver assist    Time 2    Period Weeks    Status New    Target Date 06/21/20      PT SHORT TERM GOAL #2   Title Pt will demonstrate improved independent anterior weight shift for improved ease with initiation of sit to stand transfers    Time 4    Period Weeks    Status New    Target Date 07/05/20      PT  SHORT TERM GOAL #3   Title Pt to be able to complete TUG and attain initial score.    Time 2    Period Weeks    Status New    Target Date 06/21/20               PT Long Term Goals - 07/04/20 1810       PT LONG TERM GOAL #1   Title Independent with advanced HEP with caregiver assist    Status On-going      PT LONG TERM GOAL #2   Title Pt will be able to scoot forward and laterally in sitting to assist with repositioning, pressure releife for prolonged sitting, and assist with ease of transfers.    Status On-going      PT LONG TERM GOAL #3   Title Pt will demonstrate improved functional strength of LLE to at least 3+/5 to demonstrate improved active isolated movement.    Status On-going                   Plan - 07/17/20 1756     Clinical Impression Statement Pt with some improvement initial length of steps with gait with SPC today. Was able to progress to some step ups/cone kicks with min A/guarding needed for stance on the left but overall tolerated nicely. Unable to complete a step tap with LLE but with task changed to cone tap he was able to achieve with multiple  attempts. No LOB. End of session with focus on proximal trunk control with seated multidirectional reaches with min guard provided for feet on floor , most difficult with small LOB noted with rotational reaches toward left.    PT Treatment/Interventions ADLs/Self Care Home Management;Electrical Stimulation;DME Instruction;Gait training;Neuromuscular re-education;Therapeutic exercise;Therapeutic activities;Functional mobility training;Patient/family education;Orthotic Fit/Training;Manual techniques;Energy conservation;Taping;Vestibular;Cryotherapy;Moist Heat    PT Next Visit Plan try to advance with core and stabilty    Consulted and Agree with Plan of Care Patient             Patient will benefit from skilled therapeutic intervention in order to improve the following deficits and impairments:  Abnormal gait, Decreased coordination, Difficulty walking, Pain, Impaired UE functional use, Increased muscle spasms, Impaired tone, Decreased balance, Decreased mobility, Decreased strength, Increased edema, Impaired sensation, Postural dysfunction, Decreased activity tolerance, Decreased knowledge of use of DME  Visit Diagnosis: Spastic hemiplegia affecting left nondominant side, unspecified etiology ([place])  Muscle weakness (generalized)  Unsteadiness on feet     Problem List Patient Active Problem List   Diagnosis Date Noted   Right arm pain 07/27/2019   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 [place], [place], [address] Phone: [phone]   Fax:  [phone]  Name: CORNELIS KLUVER MRN: [id number] Date of Birth: 1975-09-10

## 2020-07-18 NOTE — Therapy (Signed)
[facility]. [place], [place], [address] Phone: [phone]   Fax:  [phone]  Occupational Therapy Treatment  Patient Details  Name: Aaron Espinoza MRN: [id number] Date of Birth: 06/02/1975 Referring Provider (OT): Dr. Arlan Organ   Encounter Date: 07/17/2020   OT End of Session - 07/17/20 1633     Visit Number 9    Number of Visits 17    Date for OT Re-Evaluation 09/05/20    Authorization Type BCBS    Authorization Time Period VL: 30    Progress Note Due on Visit 10    OT Start Time 1627   pt arrived late   OT Stop Time 1700    OT Time Calculation (min) 33 min    Activity Tolerance Patient tolerated treatment well    Behavior During Therapy [facility] for tasks assessed/performed             Past Medical History:  Diagnosis Date   BPH (benign prostatic hyperplasia) 07/20/2015   Brain mass    Cognitive dysfunction 01/2019   Fatigue 01/2019   Headache    NF (neurofibromatosis) ([place]) 07/20/2015   NF1    Past Surgical History:  Procedure Laterality Date   HAND SURGERY Left    pinky - tumor benign   LEG SURGERY Left    inner thigh tumor - benign   VENTRICULOSTOMY N/A 03/17/2019   Procedure: ENDOSCOPIC THIRD VENTRICULOSTOMY;  Surgeon: Vallarie Mare, MD;  Location: [facility];  Service: Neurosurgery;  Laterality: N/A;  ENDOSCOPIC THIRD VENTRICULOSTOMY    There were no vitals filed for this visit.   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 No/denies                OT Treatments/Exercises (OP) - 07/17/20 1700       ADLs    Functional Mobility Practiced fall recovery, transitioning from floor back to w/c. Pt transitioned to the floor via half kneeling position (LLE in line w/ knee; RLE on therapy mat), then to side-sitting on R side. Due to discomfort, pt continued down to full sidelying. After attempts to return to side-sitting/quadruped/half kneeling positions were unsuccessful, OT facilitated transfer to long-sitting (Mod A) and repositioned a chair against a wall on pt's R side. Pt then planted RLE onto floor and RUE on secure chair to push back up to chair; required Max A to push up to w/c               OT Short Term Goals - 07/04/20 1745       OT SHORT TERM GOAL #1   Title Pt and spouse will be independent w/ initial HEP for LUE (self ROM/stretches)    Baseline No HEP at this time    Time 4    Period Weeks    Status Achieved   07/04/20 - reports consistent carryover of HEP   Target Date 07/05/20      OT SHORT TERM GOAL #2   Title Pt will be able to don overhead/open-front shirt w/ Mod A  Baseline Dependent w/ UB dressing per pt report    Time 4    Period Weeks    Status On-going      OT SHORT TERM GOAL #3   Title Pt to verbalize understanding of compensatory strategies, including A/E prn, to increase independence with cutting food and dressing (rocker knife, shoe buttons, etc)    Baseline Decreased knowledge of compensatory strategies    Time 4    Period Weeks    Status On-going      OT SHORT TERM GOAL #4   Title Pt to be independent with wear and care of L hand orthosis prn    Baseline No braces/orthoses at this time    Time 4    Period Weeks    Status On-going      OT SHORT TERM GOAL #5   Title Pt to verbalize understanding of visual scanning strategies and safety considerations due to lack of sensation L side    Baseline Decreaseed knowledge of safety strategies    Time 4    Period Weeks    Status On-going               OT Long Term Goals - 06/14/20 1545       OT LONG  TERM GOAL #1   Title Pt will be able to complete UB/LB dressing w/ Min A    Baseline Per pt report, Dep w/ dressing    Time 8    Period Weeks    Status On-going      OT LONG TERM GOAL #2   Title Pt to demo use of LUE as gross assist for bilateral tasks and ADLs    Time 8    Period Weeks    Status On-going      OT LONG TERM GOAL #3   Title Pt to perform simple snack/meal prep w/ necessary DME/compensatory strategies prn at distant SPV level    Baseline Unable to complete meal prep tasks at this time    Time 8    Period Weeks    Status On-going      OT LONG TERM GOAL #4   Title Pt to attend to L side of body with transfers, functional ambulation, and scanning activities at least 75% of the time    Baseline L-sided inattention/visual field cut    Time 8    Period Weeks    Status On-going      OT LONG TERM GOAL #5   Title Pt to perform gross finger extension to 75% or greater to facilitate release of cylindrical objects    Baseline Decreased finger extension    Time 8    Period Weeks    Status On-going      OT LONG TERM GOAL #6   Title Pt to demo AROM of shoulder flexion to 40* for low level reaching during ADLs    Baseline No active L shoulder flexion at this time    Time 8    Period Weeks    Status On-going               Plan - 07/17/20 1633     Clinical Impression Statement Due to pt and spouse's report in previous session regarding difficulty w/ fall recovery, and pt stating he would feels it would be beneficial to try, OT facilitated practice of fall recovery on large floor mat. Pt was able to transition to half-kneeling (LLE in line w/ knee due to decreased knee flexion) at  CGA w/ OT positioned on L side and then transition to side-sitting on R side w/ verbal cueing. Once in side-sitting, pt appeared uncomfortable and experienced difficulty transitioning back to half kneeling position. OT problem-solved with pt, assisting w/ repositioning to more tolerable  long-sitting position to facilitate pt pushing through RLE/RUE to return to w/c; Mod A w/ pt completing approx 35% of transfer.    OT Occupational Profile and History Detailed Assessment- Review of Records and additional review of physical, cognitive, psychosocial history related to current functional performance    Occupational performance deficits (Please refer to evaluation for details): ADL's;IADL's;Leisure;Social Participation    Body Structure / Function / Physical Skills ADL;Decreased knowledge of use of DME;Strength;Dexterity;GMC;Balance;Tone;UE functional use;Proprioception;Body mechanics;IADL;ROM;Vision;Mobility;Coordination;FMC;Muscle spasms;Pain;Sensation    Cognitive Skills Attention;Perception;Safety Awareness    Rehab Potential [place]   given diagnosis   Clinical Decision Making Multiple treatment options, significant modification of task necessary    Comorbidities Affecting Occupational Performance: May have comorbidities impacting occupational performance    Modification or Assistance to Complete Evaluation  Min-Moderate modification of tasks or assist with assess necessary to complete eval    OT Frequency 2x / week   May reduce frequency to 1x/week after first few weeks due to insurance VL   OT Duration 8 weeks    OT Treatment/Interventions Self-care/ADL training;Moist Heat;DME and/or AE instruction;Splinting;Therapeutic activities;Therapeutic exercise;Cognitive remediation/compensation;Coping strategies training;Neuromuscular education;Functional Mobility Training;Passive range of motion;Visual/perceptual remediation/compensation;Energy conservation;Manual Therapy;Patient/family education;Electrical Stimulation;Aquatic Therapy;Balance training    Plan LUE use (support during functional tasks)    Consulted and Agree with Plan of Care Patient;Family member/caregiver    Family Member Consulted Wife [name])             Patient will benefit from skilled therapeutic intervention in  order to improve the following deficits and impairments:   Body Structure / Function / Physical Skills: ADL, Decreased knowledge of use of DME, Strength, Dexterity, GMC, Balance, Tone, UE functional use, Proprioception, Body mechanics, IADL, ROM, Vision, Mobility, Coordination, FMC, Muscle spasms, Pain, Sensation Cognitive Skills: Attention, Perception, Safety Awareness     Visit Diagnosis: Spastic hemiplegia affecting left nondominant side, unspecified etiology ([facility])  Muscle weakness (generalized)  Other lack of coordination    Problem List Patient Active Problem List   Diagnosis Date Noted   Right arm pain 07/27/2019   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 [place], [place], [address] Phone: [phone]   Fax:  [phone]  Name: BRUK TUMOLO MRN: [id number] Date of Birth: March 12, 1975

## 2020-07-19 ENCOUNTER — Other Ambulatory Visit: Payer: Self-pay

## 2020-07-19 ENCOUNTER — Ambulatory Visit: Payer: BC Managed Care – PPO

## 2020-07-19 ENCOUNTER — Ambulatory Visit: Payer: BC Managed Care – PPO | Admitting: Occupational Therapy

## 2020-07-19 ENCOUNTER — Encounter: Payer: Self-pay | Admitting: Occupational Therapy

## 2020-07-19 DIAGNOSIS — R278 Other lack of coordination: Secondary | ICD-10-CM | POA: Diagnosis not present

## 2020-07-19 DIAGNOSIS — M6281 Muscle weakness (generalized): Secondary | ICD-10-CM

## 2020-07-19 DIAGNOSIS — G8114 Spastic hemiplegia affecting left nondominant side: Secondary | ICD-10-CM

## 2020-07-19 DIAGNOSIS — R41842 Visuospatial deficit: Secondary | ICD-10-CM | POA: Diagnosis not present

## 2020-07-19 DIAGNOSIS — R2681 Unsteadiness on feet: Secondary | ICD-10-CM

## 2020-07-19 DIAGNOSIS — R2689 Other abnormalities of gait and mobility: Secondary | ICD-10-CM | POA: Diagnosis not present

## 2020-07-19 NOTE — Therapy (Signed)
[address]. [place], [place], [address] Phone: [phone]   Fax:  [phone]  Physical Therapy Treatment 10th Visit Progress Note Reporting Period 06/07/2020 to 07/19/2020  See note below for Objective Data and Assessment of Progress/Goals.      Patient Details  Name: Aaron Espinoza MRN: [id number] Date of Birth: 09-Aug-1975 Referring Provider (PT): Mickeal Skinner Acey Lav, MD   Encounter Date: 07/19/2020   PT End of Session - 07/19/20 1808     Visit Number 10    Date for PT Re-Evaluation 08/02/20    Authorization Type BCBS - 30 visits for year    PT Start Time 1706    PT Stop Time 1755    PT Time Calculation (min) 49 min    Equipment Utilized During Treatment Gait belt    Activity Tolerance Patient tolerated treatment well;Patient limited by fatigue    Behavior During Therapy [facility] for tasks assessed/performed             Past Medical History:  Diagnosis Date   BPH (benign prostatic hyperplasia) 07/20/2015   Brain mass    Cognitive dysfunction 01/2019   Fatigue 01/2019   Headache    NF (neurofibromatosis) ([place]) 07/20/2015   NF1    Past Surgical History:  Procedure Laterality Date   HAND SURGERY Left    pinky - tumor benign   LEG SURGERY Left    inner thigh tumor - benign   VENTRICULOSTOMY N/A 03/17/2019   Procedure: ENDOSCOPIC THIRD VENTRICULOSTOMY;  Surgeon: Vallarie Mare, MD;  Location: [facility];  Service: Neurosurgery;  Laterality: N/A;  ENDOSCOPIC THIRD VENTRICULOSTOMY    There were no vitals filed for this visit.   Subjective Assessment - 07/19/20 1709     Subjective "Doing okay". per Wife doing more walking at home    Patient Stated Goals get back to walking, be able to walk more efficiently and get more confident and improve balance.    Currently in Pain? No/denies                [facility] PT Assessment - 07/19/20 0001       Strength   Overall Strength Comments LLE hip flex 3/5,  Knee ext 2+/5, knee flex to 90 deg 3/5      Timed Up and Go Test   Normal TUG (seconds) --   2 min 11 sec = 131 seconds                          [facility] Adult PT Treatment/Exercise - 07/19/20 0001       Transfers   Sit to Stand 4: Min guard;5: Supervision      Ambulation/Gait   Gait Comments Long distance ~100 ft within clinic with turns, large based SPC and CS/CGA by PT      Lumbar Exercises: Supine   Other Supine Lumbar Exercises Supine march 2 x 10 B with cues (dont stomp) to decrease speed, work on control.   Heel slides with towel under left foot 2 x 10 with tactile/verbal cues for knee bend/heel to bottom out of tonal pattern and some cues for pushing into extension.   Hooklying bridges with PT assist for LLE alignment (prevent uncontrolled ABD) and tactile cues at the left glut for hip ext 10 x 3  Hooklying hip ABD with R knee stabilized 5 x 4 LLE.  transfers to and from [facility] <> mat x 4 - CS          PT Short Term Goals - 07/19/20 1709       PT SHORT TERM GOAL #1   Title Independent with initial HEP with caregiver assist    Time 2    Period Weeks    Status On-going      PT SHORT TERM GOAL #2   Title Pt will demonstrate improved independent anterior weight shift for improved ease with initiation of sit to stand transfers    Time 4    Period Weeks    Status On-going   requires min to mod cues to complete     PT SHORT TERM GOAL #3   Title Pt to be able to complete TUG and attain initial score.    Time 2    Period Weeks    Status Achieved   07/04/20: initial score of 245 seconds (approximately 4 minutes)              PT Long Term Goals - 07/19/20 1718       PT LONG TERM GOAL #1   Title Independent with advanced HEP with caregiver assist    Status On-going      PT LONG TERM GOAL #2   Title Pt will be able to scoot forward and laterally in sitting to assist with repositioning, pressure releife for prolonged sitting, and  assist with ease of transfers.    Status On-going   improving, scooting toward the right with shorter scoots.     PT LONG TERM GOAL #3   Title Pt will demonstrate improved functional strength of LLE to at least 3+/5 to demonstrate improved active isolated movement.    Status On-going      PT LONG TERM GOAL #4   Title Pt will be able to ambulate 100 ft with LRAD, no greater than CS, and without LOB  to facilitate safety with negotiation of home and community environments.    Status On-going   CS to CGA for occasional LOB, slow gait speed but improving smoothness of step taking, step 2 with wide based SPC     PT LONG TERM GOAL #5   Title TUG improved to under 1 minute to demosntrate decreased fall risk, improved gait speed    Status On-going   131 seconds / 2 minutes 11 seconds                  Plan - 07/19/20 1809     Clinical Impression Statement Aaron Espinoza is gradually making progress towards goals. He demonstrated some improvement in engaging active isolated movement of adductors and hamstrings today with supine exercises. We progressed to long distance ambulation within the clinic and he was able to complete without any instances of LOB using a step to pattern, spastic pattern in swing and clonus present in LLE stance. Speed and smoothness of ambulation gradually progressing as demosntrated by decreased time required to complete TUG. He will benefit from continued skilled PT to work on improving upon strength and functional balance, ambulation.    PT Treatment/Interventions ADLs/Self Care Home Management;Electrical Stimulation;DME Instruction;Gait training;Neuromuscular re-education;Therapeutic exercise;Therapeutic activities;Functional mobility training;Patient/family education;Orthotic Fit/Training;Manual techniques;Energy conservation;Taping;Vestibular;Cryotherapy;Moist Heat    PT Next Visit Plan try to advance with core and stability. Can try TB wrapping for LLE to promote ankle  DF/knee flex with swing/LE advancement with gait.    Consulted and Agree with Plan of Care Patient;Family member/caregiver  Family Member Consulted Wife, [name]             Patient will benefit from skilled therapeutic intervention in order to improve the following deficits and impairments:  Abnormal gait, Decreased coordination, Difficulty walking, Pain, Impaired UE functional use, Increased muscle spasms, Impaired tone, Decreased balance, Decreased mobility, Decreased strength, Increased edema, Impaired sensation, Postural dysfunction, Decreased activity tolerance, Decreased knowledge of use of DME  Visit Diagnosis: Spastic hemiplegia affecting left nondominant side, unspecified etiology ([place])  Muscle weakness (generalized)  Other lack of coordination  Unsteadiness on feet     Problem List Patient Active Problem List   Diagnosis Date Noted   Right arm pain 07/27/2019   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 [place], [place], [address] Phone: [phone]   Fax:  [phone]  Name: Aaron Espinoza MRN: [id number] Date of Birth: 1975-07-25

## 2020-07-21 NOTE — Therapy (Signed)
[address]. [place], [place], [address] Phone: [phone]   Fax:  [phone]  Occupational Therapy Treatment & Progress Note  Patient Details  Name: Aaron Espinoza MRN: [id number] Date of Birth: [birth year] Referring Provider (OT): Dr. Arlan Organ   Encounter Date: 07/19/2020  OT End of Session - 07/19/20 1633  Visit Number 10  Number of Visits 17  Date for OT Re-Evaluation 09/05/2020  Authorization Type BCBS  Authorization Time Period VL: 30  Progress Note Due on Visit 10  OT Start Time 1619 (pt arrived late)  OT Stop Time 1700  OT Time Calculation (min) 41 min  Activity Tolerance Patient tolerated treatment well; Patient limited by pain  Behavior During Therapy [facility] for tasks assessed/performed    Occupational Therapy Progress Note  Dates of Reporting Period: 06/07/20 to 07/19/20  Objective Reports of Subjective Statement: pt experiences persistent decreased awareness and ROM of LUE  Objective Measurements: levels of independence, LUE functional use, AROM  Goal Update: see goals below for detail; pt has achieved STG1 and STG5 at this point and is continuing to progress toward STG2, STG3, and all LTGs  Plan: Continue to facilitate increased participation in functional BADLs and IADLs using compensatory/adaptive strategies prn and address increasing ROM/functional use of LUE in order to improve independence and quality of life  Reason Skilled Services are Required: Pt continues to experience deficits and limitations w/ LUE ROM/functional use, visual perception, pain, sensation, motor planning, and processing which are currently negatively impacting levels of independence   Past Medical History:  Diagnosis Date   BPH (benign prostatic hyperplasia) 07/20/2015   Brain mass    Cognitive dysfunction 01/2019   Fatigue 01/2019   Headache    NF (neurofibromatosis) ([place]) 07/20/2015   NF1    Past Surgical  History:  Procedure Laterality Date   HAND SURGERY Left    pinky - tumor benign   LEG SURGERY Left    inner thigh tumor - benign   VENTRICULOSTOMY N/A 03/17/2019   Procedure: ENDOSCOPIC THIRD VENTRICULOSTOMY;  Surgeon: Vallarie Mare, MD;  Location: [facility];  Service: Neurosurgery;  Laterality: N/A;  ENDOSCOPIC THIRD VENTRICULOSTOMY    There were no vitals filed for this visit.   Subjective Assessment - 07/19/20 1626 Subjective  Pt stated he could not tell (proprioceptively or visually) if he was elevating his shoulder/scapula or not  Patient is accompanied by: Family memberPatient is accompanied by:Marland Kitchen Family member. The comment is Wife [name]). Taken on 07/19/20 1626  Pertinent History anaplastic astrocytoma of thalamus (diagnosed January 2021)  Limitations high fall risk, active CA (Chemo treatment 1x/month for 5 days)  Patient Stated Goals Regain LUE functional use, work on walking and being more independent w/ ADLs  Currently in Pain? No/denies     OT Treatments/Exercises (OP) - 07/19/20 SPT completed toward R side using walking cane from w/c <> EOM w/ CGA and verbal cueing and extended processing time for positioning/re-positioning of BLEs  Sitting-to-supine and supine-to-sit transition completed 2x each; Min A for LLE w/ sitting to supine and CGA w/ extended processing time from supine-to-sit  OT-facilitated gentle passive stretching of L elbow ext/flex, wrist ext, and finger ext/flex in supine position w/ LUE resting in position of comfort, slightly abducted and elevated off mat (pillow for support underneath); pt able to tolerate activity w/out pain and mild increased frequency of tolerable muscle spasms felt w/ wrist extension stretch  Neuro re-ed exercise of bilateral shoulder shrugs completed sitting  EOM w/ mirror in front of pt as a visual cue; OT also provided tactile and verbal cueing to facilitate movement w/ neutral results  Self-PROM of LUE forward flexion using RUE to  facilitate movement; pt educated on not pushing past point of discomfort and avoiding compensatory movements     OT Short Term Goals - 07/20/20 1010       OT SHORT TERM GOAL #1   Title Pt and spouse will be independent w/ initial HEP for LUE (self ROM/stretches)    Baseline No HEP at this time    Time 4    Period Weeks    Status Achieved   07/04/20 - reports consistent carryover of HEP   Target Date 07/05/20      OT SHORT TERM GOAL #2   Title Pt will be able to don overhead/open-front shirt w/ Mod A    Baseline Dependent w/ UB dressing per pt report    Time 4    Period Weeks    Status On-going      OT SHORT TERM GOAL #3   Title Pt to verbalize understanding of compensatory strategies, including A/E prn, to increase independence with cutting food and dressing (rocker knife, shoe buttons, etc)    Baseline Decreased knowledge of compensatory strategies    Time 4    Period Weeks    Status On-going      OT SHORT TERM GOAL #4   Title Pt to be independent with wear and care of L hand orthosis prn    Baseline No braces/orthoses at this time    Time 4    Period Weeks    Status On-going      OT SHORT TERM GOAL #5   Title Pt to verbalize understanding of visual scanning strategies and safety considerations due to lack of sensation L side    Baseline Decreaseed knowledge of safety strategies    Time 4    Period Weeks    Status Achieved   07/19/20 - verbalized visual compensatory strategies independently              OT Long Term Goals - 06/14/20 1545       OT LONG TERM GOAL #1   Title Pt will be able to complete UB/LB dressing w/ Min A    Baseline Per pt report, Dep w/ dressing    Time 8    Period Weeks    Status On-going      OT LONG TERM GOAL #2   Title Pt to demo use of LUE as gross assist for bilateral tasks and ADLs    Time 8    Period Weeks    Status On-going      OT LONG TERM GOAL #3   Title Pt to perform simple snack/meal prep w/ necessary DME/compensatory  strategies prn at distant SPV level    Baseline Unable to complete meal prep tasks at this time    Time 8    Period Weeks    Status On-going      OT LONG TERM GOAL #4   Title Pt to attend to L side of body with transfers, functional ambulation, and scanning activities at least 75% of the time    Baseline L-sided inattention/visual field cut    Time 8    Period Weeks    Status On-going      OT LONG TERM GOAL #5   Title Pt to perform gross finger extension to 75% or greater to facilitate  release of cylindrical objects    Baseline Decreased finger extension    Time 8    Period Weeks    Status On-going      OT LONG TERM GOAL #6   Title Pt to demo AROM of shoulder flexion to 40* for low level reaching during ADLs    Baseline No active L shoulder flexion at this time    Time 8    Period Weeks    Status On-going             Plan - 07/19/20 1701 Clinical Impression Statement Pt completed SPT from w/c <> EOM as well as sit-to-supine/supine-to-sit transitions to facilitate increased independence w/ functional mobility; during first attempt of sitting to supine, pt experienced significant muscle spasm in LUE, which was more prolonged than what has been observed in previous sessions. After resting for a few minutes, pt attempted transition again w/ OT providing support of LUE and positioning a pillow to prevent increased shoulder extension with positive results. Pt continues to tolerate therapist-facilitated ROM/stretching of LUE well in supoorted supine position. OT also demonstrated, w/ pt returning demo, shoulder shrugs and self-PROM exercises for pt to include in HEP; handout administered.  OT Occupational Profile and History Detailed Assessment- Review of Records and additional review of physical, cognitive, psychosocial history related to current functional performance  Occupational performance deficits (Please refer to evaluation for details): ADL's; IADL's; Leisure; Social  Participation  Pt will benefit from skilled therapeutic intervention in order to improve on the following performance deficits Body Structure / Function / Physical Skills; Cognitive Skills  Body Structure / Function / Physical Skills ADL; Decreased knowledge of use of DME; Strength; Dexterity; [place]; Balance; Tone; UE functional use; Proprioception; Body mechanics; IADL; ROM; Vision; Mobility; Coordination; [place]; Muscle spasms; Pain; Sensation  Cognitive Skills Attention; Perception; Safety Awareness  Rehab Potential Fair (given diagnosis)  Clinical Decision Making Multiple treatment options, significant modification of task necessary  Comorbidities Affecting Occupational Performance: May have comorbidities impacting occupational performance  Modification or Assistance to Complete Evaluation  Min-Moderate modification of tasks or assist with assess necessary to complete eval  OT Frequency 2x / week (May reduce frequency to 1x/week after first few weeks due to insurance VL)  OT Duration 8 weeks  OT Treatment/Interventions Self-care/ADL training; Moist Heat; DME and/or AE instruction; Splinting; Therapeutic activities; Therapeutic exercise; Cognitive remediation/compensation; Coping strategies training; Neuromuscular education; Functional Mobility Training; Passive range of motion; Visual/perceptual remediation/compensation; Energy conservation; Manual Therapy; Patient/family education; Printmaker; Aquatic Therapy; Balance training  Plan LUE use (support during functional tasks)  Consulted and Agree with Plan of Care Patient; Family member/caregiver  Family Member Consulted Wife [name])    Patient will benefit from skilled therapeutic intervention in order to improve the following deficits and impairments:   Body Structure / Function / Physical Skills: ADL, Decreased knowledge of use of DME, Strength, Dexterity, GMC, Balance, Tone, UE functional use, Proprioception, Body mechanics, IADL,  ROM, Vision, Mobility, Coordination, FMC, Muscle spasms, Pain, Sensation Cognitive Skills: Attention, Perception, Safety Awareness     Visit Diagnosis: Spastic hemiplegia affecting left nondominant side, unspecified etiology ([facility])  Muscle weakness (generalized)  Other lack of coordination  Visuospatial deficit    Problem List Patient Active Problem List   Diagnosis Date Noted   Right arm pain 07/27/2019   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 [place], [place], [address] Phone: [phone]   Fax:  [phone]  Name: Aaron Espinoza MRN: [id number] Date of Birth: 1975-04-10

## 2020-07-26 ENCOUNTER — Ambulatory Visit: Payer: BC Managed Care – PPO | Admitting: Occupational Therapy

## 2020-07-26 ENCOUNTER — Ambulatory Visit: Payer: BC Managed Care – PPO

## 2020-08-01 ENCOUNTER — Ambulatory Visit: Payer: BC Managed Care – PPO | Admitting: Occupational Therapy

## 2020-08-01 ENCOUNTER — Ambulatory Visit: Payer: BC Managed Care – PPO | Admitting: Physical Therapy

## 2020-08-02 ENCOUNTER — Other Ambulatory Visit: Payer: Self-pay | Admitting: Internal Medicine

## 2020-08-02 MED ORDER — GABAPENTIN 300 MG PO CAPS: 300.0000 mg | ORAL_CAPSULE | Freq: Three times a day (TID) | ORAL | 3 refills | Status: DC

## 2020-08-03 ENCOUNTER — Ambulatory Visit: Payer: BC Managed Care – PPO | Admitting: Physical Therapy

## 2020-08-03 ENCOUNTER — Ambulatory Visit: Payer: BC Managed Care – PPO | Admitting: Occupational Therapy

## 2020-08-14 ENCOUNTER — Ambulatory Visit: Payer: BC Managed Care – PPO | Attending: Internal Medicine | Admitting: Occupational Therapy

## 2020-08-14 ENCOUNTER — Ambulatory Visit: Payer: BC Managed Care – PPO

## 2020-08-16 ENCOUNTER — Ambulatory Visit: Payer: BC Managed Care – PPO | Admitting: Occupational Therapy

## 2020-08-16 ENCOUNTER — Ambulatory Visit: Payer: BC Managed Care – PPO

## 2020-08-21 ENCOUNTER — Ambulatory Visit: Payer: BC Managed Care – PPO

## 2020-08-21 ENCOUNTER — Ambulatory Visit: Payer: BC Managed Care – PPO | Admitting: Occupational Therapy

## 2020-08-21 ENCOUNTER — Other Ambulatory Visit: Payer: Self-pay | Admitting: Internal Medicine

## 2020-08-23 ENCOUNTER — Encounter: Payer: BC Managed Care – PPO | Admitting: Occupational Therapy

## 2020-09-04 ENCOUNTER — Encounter: Payer: Self-pay | Admitting: Internal Medicine

## 2020-09-05 ENCOUNTER — Other Ambulatory Visit: Payer: Self-pay | Admitting: Internal Medicine

## 2020-09-05 DIAGNOSIS — M25512 Pain in left shoulder: Secondary | ICD-10-CM

## 2020-09-05 DIAGNOSIS — G8929 Other chronic pain: Secondary | ICD-10-CM

## 2020-09-25 ENCOUNTER — Inpatient Hospital Stay: Payer: BC Managed Care – PPO | Admitting: Internal Medicine

## 2020-09-25 NOTE — Therapy (Signed)
[address]. [place], [place], [address] Phone: [phone]   Fax:  [phone]  September 25, 2020    Occupational Therapy Discharge Summary   Patient: Aaron Espinoza MRN: [id number] Date of Birth: 09/17/1975  Diagnosis: Spastic hemiplegia affecting left nondominant side, unspecified etiology ([place])  Muscle weakness (generalized)  Other lack of coordination  Visuospatial deficit  Referring Provider (OT): Dr. Arlan Organ   Visits from Start of Care: 10  Remaining deficits: At the time of last OT visit, pt was experiencing limitations w/ LUE ROM/functional use, visual perception, pain, sensation, motor planning, and processing which were negatively impacting independence and QoL  Functional level related to goals / functional outcomes: Pt had met 2/5 STGs and no LTGs at the time of last visit   Patient is being discharged at this time due to  not returning since last visit w/ lapse in OT services greater than 60 days.   Kathrine Cords, OTR/L, MSOT 09/25/2020 9:10 AM

## 2020-09-26 ENCOUNTER — Other Ambulatory Visit: Payer: Self-pay | Admitting: Radiation Therapy

## 2020-10-03 ENCOUNTER — Ambulatory Visit (HOSPITAL_COMMUNITY)
Admission: RE | Admit: 2020-10-03 | Discharge: 2020-10-03 | Disposition: A | Discharge status: Home / Self Care | Payer: BLUE CROSS/BLUE SHIELD | Source: Ambulatory Visit | Attending: Internal Medicine | Admitting: Internal Medicine

## 2020-10-03 ENCOUNTER — Other Ambulatory Visit: Payer: Self-pay

## 2020-10-03 DIAGNOSIS — R220 Localized swelling, mass and lump, head: Secondary | ICD-10-CM | POA: Diagnosis not present

## 2020-10-03 DIAGNOSIS — C710 Malignant neoplasm of cerebrum, except lobes and ventricles: Secondary | ICD-10-CM | POA: Insufficient documentation

## 2020-10-03 DIAGNOSIS — G8929 Other chronic pain: Secondary | ICD-10-CM | POA: Insufficient documentation

## 2020-10-03 DIAGNOSIS — C719 Malignant neoplasm of brain, unspecified: Secondary | ICD-10-CM | POA: Diagnosis not present

## 2020-10-03 DIAGNOSIS — M19012 Primary osteoarthritis, left shoulder: Secondary | ICD-10-CM | POA: Diagnosis not present

## 2020-10-03 DIAGNOSIS — M25512 Pain in left shoulder: Secondary | ICD-10-CM | POA: Diagnosis not present

## 2020-10-03 DIAGNOSIS — S46012A Strain of muscle(s) and tendon(s) of the rotator cuff of left shoulder, initial encounter: Secondary | ICD-10-CM | POA: Diagnosis not present

## 2020-10-03 DIAGNOSIS — C729 Malignant neoplasm of central nervous system, unspecified: Secondary | ICD-10-CM | POA: Diagnosis not present

## 2020-10-03 IMAGING — MR MR HEAD WO/W CM
14 series · 48 of 48 positions shown · IV contrast (gadavist)
Comparison: [DATE]

CLINICAL DATA: CNS neoplasm, assess treatment response

EXAM:
MRI HEAD WITHOUT AND WITH CONTRAST
TECHNIQUE: Multiplanar, multiecho pulse sequences of the brain and surrounding
structures were obtained without and with intravenous contrast.
CONTRAST:  9mL GADAVIST GADOBUTROL 1 MMOL/ML IV SOLN

[Series 5: DWI · axial · 3.0mm · 1.36mm/px · z∈[-73,+74]mm · 6 of 100 slices shown (1 of 2)]
[im 1/100]
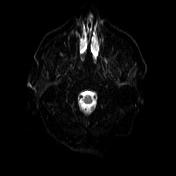
[im 20/100]
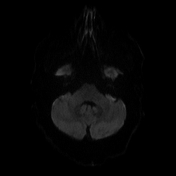
[im 40/100]
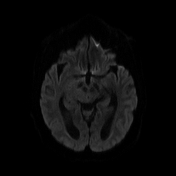
[im 60/100]
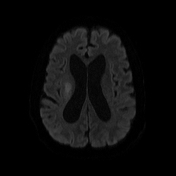
[im 80/100]
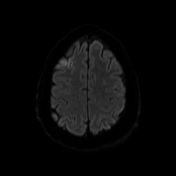
[im 100/100]
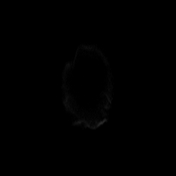

[Series 6: DWI · axial · 3.0mm · 1.36mm/px · z∈[-73,+74]mm · 3 of 50 slices shown (2 of 2)]
[im 1/50]
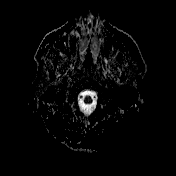
[im 25/50]
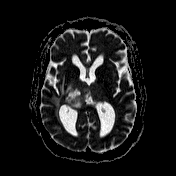
[im 50/50]
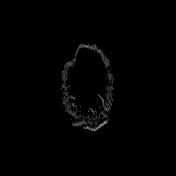

[Series 7: T1 · sagittal · 5.0mm · 0.75mm/px · 1 of 24 slices shown (1 of 2)]
[im 1/24]
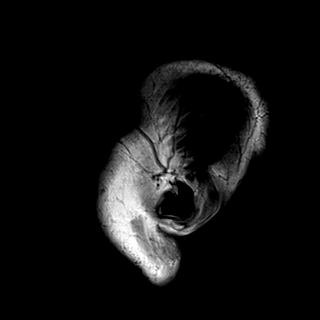

[Series 8: T2 · axial · 5.0mm · 0.62mm/px · 1 of 26 slices shown (1 of 2)]
[im 1/26]
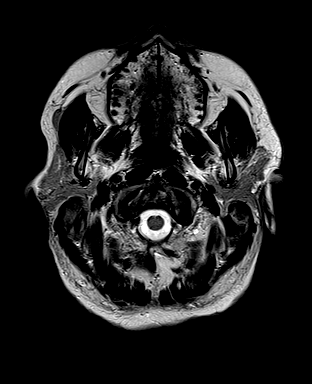

[Series 9: FLAIR · axial · 3.0mm · 0.75mm/px · z∈[-76,+77]mm · 3 of 52 slices shown]
[im 1/52]
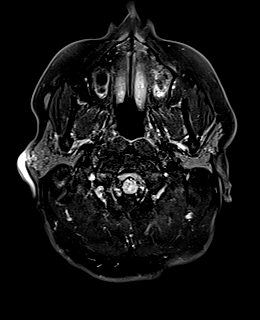
[im 26/52]
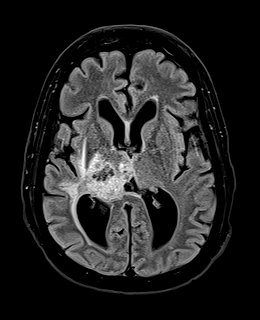
[im 52/52]
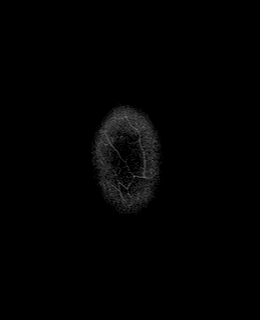

[Series 10: swi_images · axial · 3.0mm · 0.75mm/px · z∈[-88,+89]mm · 3 of 60 slices shown]
[im 1/60]
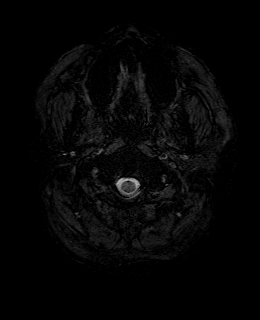
[im 30/60]
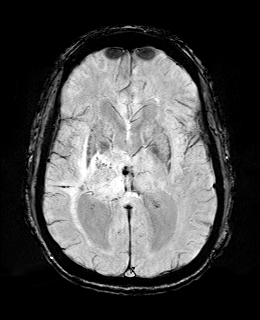
[im 60/60]
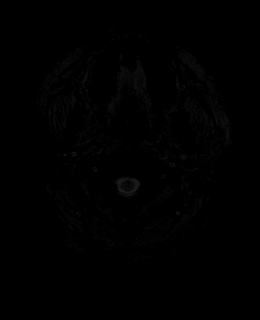

[Series 11: mip_images(sw) · axial · 24.0mm · 0.75mm/px · z∈[-77,+79]mm · 3 of 53 slices shown]
[im 1/53]
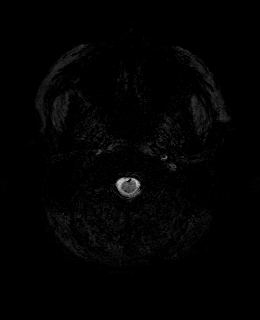
[im 27/53]
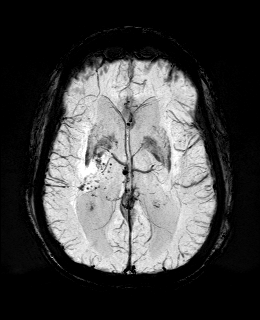
[im 53/53]
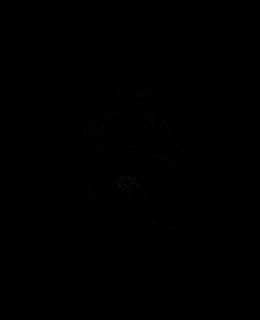

[Series 12: T1 · axial · 1.0mm · 0.94mm/px · z∈[-79,+80]mm · 9 of 160 slices shown (2 of 2)]
[im 1/160]
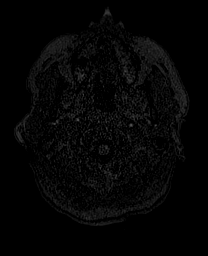
[im 20/160]
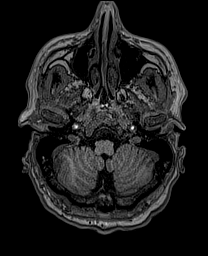
[im 40/160]
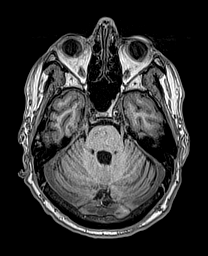
[im 60/160]
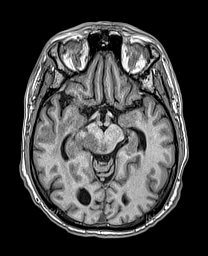
[im 80/160]
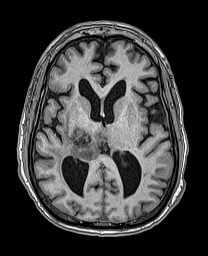
[im 100/160]
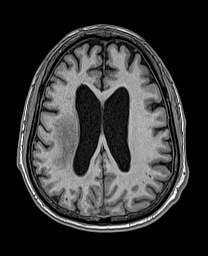
[im 120/160]
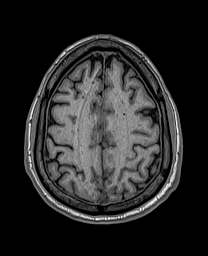
[im 140/160]
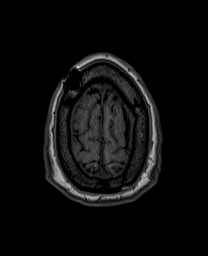
[im 160/160]
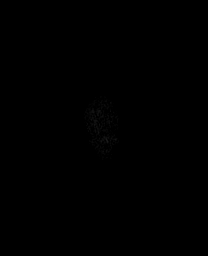

[Series 13: cor dwi_tracew · coronal · 5.0mm · 1.53mm/px · 3 of 56 slices shown]
[im 1/56]
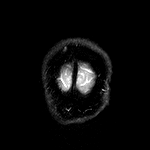
[im 28/56]
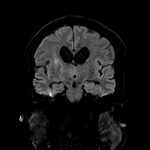
[im 56/56]
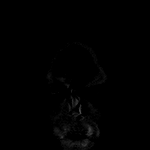

[Series 14: cor dwi_adc · coronal · 5.0mm · 1.53mm/px · 2 of 28 slices shown]
[im 1/28]
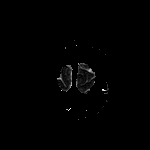
[im 28/28]
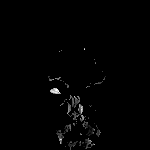

[Series 15: T2 · coronal · 5.0mm · 0.57mm/px · 2 of 35 slices shown (2 of 2)]
[im 1/35]
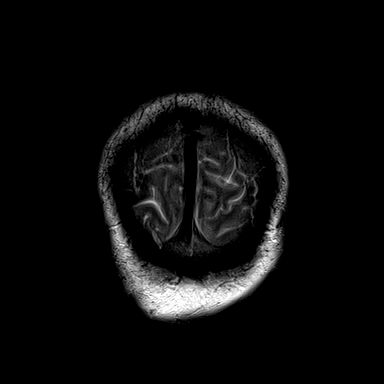
[im 35/35]
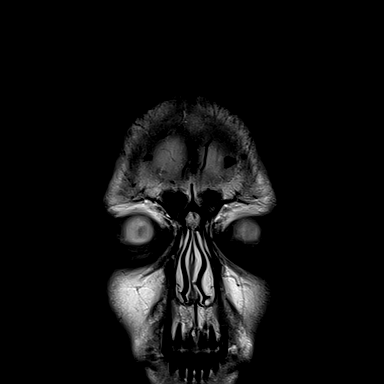

[Series 20: T1 post-contrast · axial · 1.0mm · 0.94mm/px · z∈[-64,+92]mm · 9 of 160 slices shown (1 of 3)]
[im 1/160]
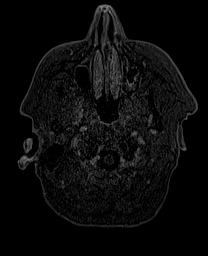
[im 20/160]
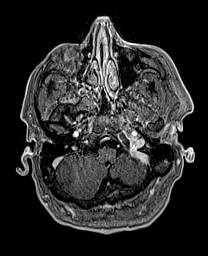
[im 40/160]
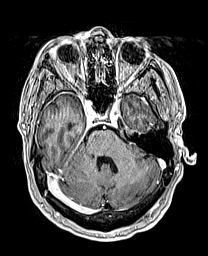
[im 60/160]
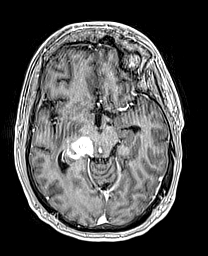
[im 80/160]
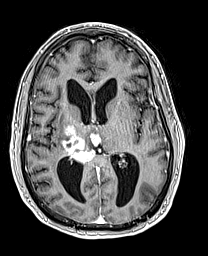
[im 100/160]
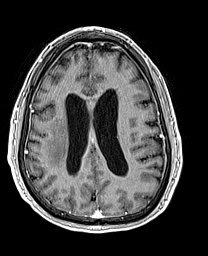
[im 120/160]
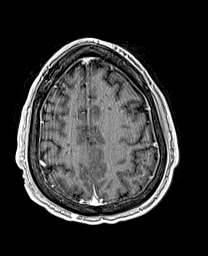
[im 140/160]
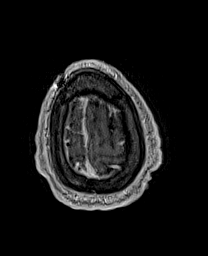
[im 160/160]
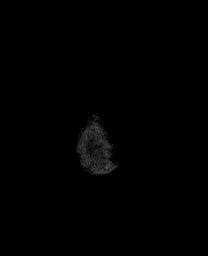

[Series 21: T1 post-contrast · coronal · 5.0mm · 0.43mm/px · 2 of 30 slices shown (2 of 3)]
[im 1/30]
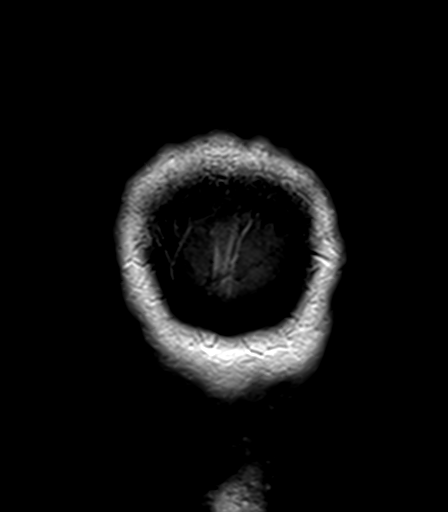
[im 30/30]
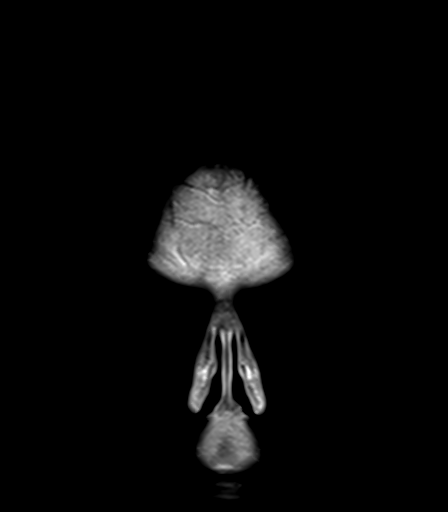

[Series 22: T1 post-contrast · sagittal · 5.0mm · 0.75mm/px · 1 of 24 slices shown (3 of 3)]
[im 1/24]
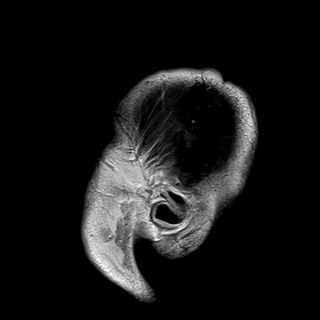

[48 of 48 positions shown; findings below may reference images not displayed]

FINDINGS: Brain: Heterogeneous mass centered at the right thalamus with
involvement of the upper midbrain, ependyma at and around the
cerebral aqua duct, and extending upward along the cortical spinal
radiations. Irregular shape defies reproducible measurement. The
extent is unchanged from before except for a superior nodular
projection at the level of the corona radiata which has greater
cranial extent by coronal postcontrast images, a small change
relative to the size of enhancing mass. T2 signal is unchanged.
Local mass effect is unchanged. Despite the appearing only solid
enhancement at the level of the cerebral aqua duct there is no
change in ventricular volume or convincing hydrocephalus.

No complicating hemorrhage, infarct, or extra-axial collection.
Wallerian degeneration is seen descending the right brainstem.

Vascular: Normal flow voids.  Normal vessel enhancements.

Skull and upper cervical spine: Unremarkable right calvarial burr
hole.

Sinuses/Orbits: Negative
IMPRESSION: 1. Subtly increased enhancing volume along the upper aspect of the
mass at the level of the corona radiata. This change is minimal
relative to the size of the mass.
2. Stable T2 signal and mass effect.
3. Stable ventricular volume despite the seemingly solid enhancement
at the level of the upper cerebral aqua duct.

## 2020-10-03 MED ORDER — GADOBUTROL 1 MMOL/ML IV SOLN
9.0000 mL | Freq: Once | INTRAVENOUS | Status: AC | PRN
  Administered 2020-10-03: 9 mL via INTRAVENOUS

## 2020-10-05 ENCOUNTER — Inpatient Hospital Stay: Payer: BC Managed Care – PPO | Attending: Internal Medicine | Admitting: Internal Medicine

## 2020-10-05 ENCOUNTER — Other Ambulatory Visit: Payer: Self-pay

## 2020-10-05 VITALS — BP 135/91 | HR 55 | Temp 97.8°F | Resp 20 | Wt 186.2 lb

## 2020-10-05 DIAGNOSIS — Z79899 Other long term (current) drug therapy: Secondary | ICD-10-CM | POA: Diagnosis not present

## 2020-10-05 DIAGNOSIS — R5383 Other fatigue: Secondary | ICD-10-CM | POA: Diagnosis not present

## 2020-10-05 DIAGNOSIS — R112 Nausea with vomiting, unspecified: Secondary | ICD-10-CM | POA: Insufficient documentation

## 2020-10-05 DIAGNOSIS — C710 Malignant neoplasm of cerebrum, except lobes and ventricles: Secondary | ICD-10-CM | POA: Insufficient documentation

## 2020-10-05 DIAGNOSIS — Z9221 Personal history of antineoplastic chemotherapy: Secondary | ICD-10-CM | POA: Diagnosis not present

## 2020-10-05 DIAGNOSIS — Z923 Personal history of irradiation: Secondary | ICD-10-CM | POA: Diagnosis not present

## 2020-10-05 MED ORDER — TRAMADOL HCL 50 MG PO TABS: 50.0000 mg | ORAL_TABLET | Freq: Four times a day (QID) | ORAL | 1 refills | Status: DC | PRN

## 2020-10-05 MED ORDER — TEMOZOLOMIDE 100 MG PO CAPS: 400.0000 mg | ORAL_CAPSULE | Freq: Every day | ORAL | 0 refills | Status: DC

## 2020-10-05 NOTE — Progress Notes (Signed)
[facility] at [facility] [place], [place] [address] [phone]   Interval Evaluation  Date of Service: 10/05/20 Patient Name: Aaron Espinoza Patient MRN: [id number] Patient DOB: October 06, 1975 Provider: Ventura Sellers, MD  Identifying Statement:  Aaron Espinoza is a 45 y.o. male with  right thalamic  anaplastic astrocytoma   Oncologic History: Oncology History  Anaplastic astrocytoma of thalamus ([place])  03/17/2019 Surgery   Stereotactic biopsy by Dr. Marcello Moores; path demonstrates anaplastic astrocytoma   04/12/2019 - 05/21/2019 Radiation Therapy   IMRT with concurrent Temodar 66m/m2 daily   06/14/2019 -  Chemotherapy    Patient is on Treatment Plan: BRAIN ANAPLASTIC GLIOMA GRADE III TEMOZOLOMIDE POST XRT Q28D         Biomarkers:  MGMT Unknown.  IDH 1/2 Wild type.  EGFR Unknown  TERT Unknown   Interval History:  JALLAN MINOTTIpresents to clinic for follow up after recent MRI brain.  Left sided weakness remains dense.  He continues to rely ull time on a four pointed walker, no longer able to use the cane for ambulation.  No longer on hydrocortisone.   Shoulder pain is persistent and not improved.  Fatigue is otherwise stable from prior.    H+P (03/30/19) Patient presented to medical attention in late January, when he noticed mild confusion, gait imbalance.  These symptoms worsened over the next ~10 days, and were accompanied by incontinence.  Brain MRI was performed and demonstrated a right thalamic mass consistent with likely primary brain tumor.  He underwent stereotactic biopsy and ventriculostomy on 03/17/19 with Dr. TMarcello Moores  Following surgery he describes considerable improvement in most of his symptoms; at this time he is near baseline function but "very fatigued" overall.  No seizures or headaches since surgery.  Medications: Current Outpatient Medications on File Prior to Visit  Medication Sig Dispense Refill   acetaminophen  (TYLENOL) 500 MG tablet Take 1,000 mg by mouth every 6 (six) hours as needed.     cyclobenzaprine (FLEXERIL) 5 MG tablet Take 1 tablet (5 mg total) by mouth 3 (three) times daily as needed for muscle spasms. 60 tablet 0   docusate sodium (COLACE) 100 MG capsule Take 100 mg by mouth daily.     gabapentin (NEURONTIN) 300 MG capsule Take 1 capsule (300 mg total) by mouth 3 (three) times daily. 90 capsule 3   hydrocortisone (CORTEF) 10 MG tablet Take 1 tablet (10 mg total) by mouth 2 (two) times daily. (Patient taking differently: Take 10 mg by mouth daily.) 60 tablet 2   ibuprofen (ADVIL) 200 MG tablet Take 800 mg by mouth every 6 (six) hours as needed.     lisinopril (ZESTRIL) 10 MG tablet TAKE 1 TABLET BY MOUTH EVERY DAY 90 tablet 1   NON FORMULARY Take 2 capsules by mouth daily. [employer] CORDYCEPS for Energy Support     NON FORMULARY Take 2 capsules by mouth daily. [employer] BRAIN for Mental Clarity     NON FORMULARY Take 2 capsules by mouth daily. [employer] STAMETS 7 for Daily Immune Support     NON FORMULARY Take 2 capsules by mouth daily. [employer] [place]IL for Immune Support     ondansetron (ZOFRAN) 8 MG tablet Take 1 tablet (8 mg total) by mouth 2 (two) times daily as needed (nausea and vomiting). May take 30-60 minutes prior to Temodar administration if nausea/vomiting occurs. 30 tablet 1   pantoprazole (PROTONIX) 40 MG tablet TAKE 1  TABLET BY MOUTH EVERY DAY 90 tablet 1   polyethylene glycol (MIRALAX / GLYCOLAX) 17 g packet Take 1 packet by mouth daily.     STOOL SOFTENER 100 MG capsule TAKE 1 TABLET BY MOUTH 2 TIMES EVERY DAY AT BEDTIME AS NEEDED (Patient not taking: Reported on 06/19/2020) 60 tablet 2   tamsulosin (FLOMAX) 0.4 MG CAPS capsule TAKE 1 CAPSULE (0.4 MG TOTAL) BY MOUTH DAILY AFTER SUPPER. 90 capsule 1   No current facility-administered medications on file prior to visit.    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   Past Surgical History:  Past Surgical History:  Procedure Laterality Date   HAND SURGERY Left    pinky - tumor benign   LEG SURGERY Left    inner thigh tumor - benign   VENTRICULOSTOMY N/A 03/17/2019   Procedure: ENDOSCOPIC THIRD VENTRICULOSTOMY;  Surgeon: Vallarie Mare, MD;  Location: [facility];  Service: Neurosurgery;  Laterality: N/A;  ENDOSCOPIC THIRD VENTRICULOSTOMY   Social History:  Social History   Socioeconomic History   Marital status: Married    Spouse name: Not on file   Number of children: Not on file   Years of education: Not on file   Highest education level: Not on file  Occupational History   Not on file  Tobacco Use   Smoking status: Former    Types: Cigars, Cigarettes   Smokeless tobacco: Never   Tobacco comments:    Quit smoking cigarettes 25 yrs ago, quit cigars in 2020  Vaping Use   Vaping Use: Never used  Substance and Sexual Activity   Alcohol use: Yes    Alcohol/week: 6.0 - 9.0 standard drinks    Types: 6 - 9 Standard drinks or equivalent per week   Drug use: No   Sexual activity: Not on file  Other Topics Concern   Not on file  Social History Narrative   Not on file   Social Determinants of Health   Financial Resource Strain: Not on file  Food Insecurity: Not on file  Transportation Needs: Not on file  Physical Activity: Not on file  Stress: Not on file  Social Connections: Not on file  Intimate Partner Violence: Not on file   Family History:  Family History  Problem Relation Age of Onset   Cancer Paternal Grandfather        Prostate    Review of Systems: Constitutional: swelling in face Eyes: Doesn't report blurriness of vision Ears, nose, mouth, throat, and face: Doesn't report sore  throat Respiratory: Doesn't report cough, dyspnea or wheezes Cardiovascular: Doesn't report palpitation, chest discomfort  Gastrointestinal:  Doesn't report nausea, constipation, diarrhea GU: difficulty initiating stream, nocturia Skin: Doesn't report skin rashes Neurological: Per HPI Musculoskeletal: Doesn't report joint pain Behavioral/Psych: Doesn't report anxiety  Physical Exam: Vitals:   10/05/20 1144  BP: (!) 135/91  Pulse: (!) 55  Resp: 20  Temp: 97.8 F (36.6 C)  SpO2: 100%   KPS: 60. General: cushingoid features Head: Normal EENT: No conjunctival injection or scleral icterus.  Lungs: Resp effort normal Cardiac: Regular rate Abdomen: Non-distended abdomen Skin: No rashes cyanosis or petechiae. Extremities: No clubbing or edema  Neurologic Exam: Mental Status: Awake, alert,  attentive to examiner. Oriented to self and environment. Language is fluent with intact comprehension.  Cranial Nerves: Visual acuity is grossly normal. L visual field impairment. Extra-ocular movements intact. No ptosis. Face is symmetric Motor: Tone and bulk are normal. Power is 1/5 in left arm and 3/5 in left leg. Reflexes are symmetric, no pathologic reflexes present.  Sensory: Intact to light touch Gait: Hemiparetic, not independent  Labs: I have reviewed the data as listed    Component Value Date/Time   NA 138 05/22/2020 1349   K 3.9 05/22/2020 1349   CL 100 05/22/2020 1349   CO2 28 05/22/2020 1349   GLUCOSE 95 05/22/2020 1349   BUN 13 05/22/2020 1349   CREATININE 0.74 05/22/2020 1349   CREATININE 0.83 03/04/2019 1206   CALCIUM 9.5 05/22/2020 1349   PROT 6.9 05/22/2020 1349   ALBUMIN 4.0 05/22/2020 1349   AST 9 (L) 05/22/2020 1349   ALT 13 05/22/2020 1349   ALKPHOS 49 05/22/2020 1349   BILITOT 0.6 05/22/2020 1349   GFRNONAA >60 05/22/2020 1349   GFRNONAA 106 03/04/2019 1206   GFRAA >60 10/25/2019 1209   GFRAA 123 03/04/2019 1206   Lab Results  Component Value Date   WBC  8.3 05/22/2020   NEUTROABS 6.0 05/22/2020   HGB 16.2 05/22/2020   HCT 45.6 05/22/2020   MCV 87.7 05/22/2020   PLT 233 05/22/2020   Imaging:  [facility] Clinician Interpretation: I have personally reviewed the CNS images as listed.  My interpretation, in the context of the patient's clinical presentation, is progressive disease  MR BRAIN W WO CONTRAST  Result Date: 10/05/2020 CLINICAL DATA:  CNS neoplasm, assess treatment response EXAM: MRI HEAD WITHOUT AND WITH CONTRAST TECHNIQUE: Multiplanar, multiecho pulse sequences of the brain and surrounding structures were obtained without and with intravenous contrast. CONTRAST:  58m GADAVIST GADOBUTROL 1 MMOL/ML IV SOLN COMPARISON:  06/16/2020 FINDINGS: Brain: Heterogeneous mass centered at the right thalamus with involvement of the upper midbrain, ependyma at and around the cerebral aqua duct, and extending upward along the cortical spinal radiations. Irregular shape defies reproducible measurement. The extent is unchanged from before except for a superior nodular projection at the level of the corona radiata which has greater cranial extent by coronal postcontrast images, a small change relative to the size of enhancing mass. T2 signal is unchanged. Local mass effect is unchanged. Despite the appearing only solid enhancement at the level of the cerebral aqua duct there is no change in ventricular volume or convincing hydrocephalus. No complicating hemorrhage, infarct, or extra-axial collection. Wallerian degeneration is seen descending the right brainstem. Vascular: Normal flow voids.  Normal vessel enhancements. Skull and upper cervical spine: Unremarkable right calvarial burr hole. Sinuses/Orbits: Negative IMPRESSION: 1. Subtly increased enhancing volume along the upper aspect of the mass at the level of the corona radiata. This change is minimal relative to the size of the mass. 2. Stable T2 signal and mass effect. 3. Stable ventricular volume despite the  seemingly solid enhancement at the level of the upper cerebral aqua duct. Electronically Signed   By: JMonte FantasiaM.D.   On: 10/05/2020 10:12     Assessment/Plan Anaplastic astrocytoma of thalamus ([place] [C71.0]  Mr. PWeissbergis clinically stable today.  MRI brain demonstrates some modest change in T1-post signal volume, with extension superiorly along the lateral ventricle.  The relatively small degree of change does not meet RANO criteria for tumor progression, but is most likely representative of organic tumor extension.    We recommended  resuming treatment with Temozolomide 200 mg/m2, on for five days and off for twenty three days in twenty eight day cycles. The patient will have a complete blood count performed on days 21 and 28 of each cycle, and a comprehensive metabolic panel performed on day 28 of each cycle. Labs may need to be performed more often. Zofran will prescribed for home use for nausea/vomiting.   Informed consent was obtained verbally at bedside to proceed with oral chemotherapy.  Chemotherapy should be held for the following:  ANC less than 1,000  Platelets less than 100,000  LFT or creatinine greater than 2x ULN  If clinical concerns/contraindications develop  He will continue with home PT and OT.  We will prescribe Tramadol 7m q8 PRN for severe shoulder pain, in addition to the gabapentin.  We will also place referral to orthopedics, pending read of recent shoulder MRI study.  We appreciate the opportunity to participate in the care of JLEAMAN ABE   We ask that JMASE DHONDTreturn to clinic in 1 months prior to cycle #14 of Temodar or sooner as needed.  All questions were answered. The patient knows to call the clinic with any problems, questions or concerns. No barriers to learning were detected.  I have spent a total of 40 minutes of face-to-face and non-face-to-face time, excluding clinical staff time, preparing to see patient, ordering tests  and/or medications, counseling the patient, and independently interpreting results and communicating results to the patient/family/caregiver   ZVentura Sellers MD Medical Director of Neuro-Oncology [facility]ark[facility] Centerat [place]09/01/22 11:13 AM

## 2020-10-06 ENCOUNTER — Other Ambulatory Visit: Payer: Self-pay | Admitting: Internal Medicine

## 2020-10-06 ENCOUNTER — Telehealth: Payer: Self-pay | Admitting: Pharmacist

## 2020-10-06 ENCOUNTER — Telehealth: Payer: Self-pay | Admitting: Internal Medicine

## 2020-10-06 DIAGNOSIS — C710 Malignant neoplasm of cerebrum, except lobes and ventricles: Secondary | ICD-10-CM

## 2020-10-06 DIAGNOSIS — M7552 Bursitis of left shoulder: Secondary | ICD-10-CM

## 2020-10-06 MED ORDER — ONDANSETRON HCL 8 MG PO TABS: 8.0000 mg | ORAL_TABLET | Freq: Two times a day (BID) | ORAL | 1 refills | Status: DC | PRN

## 2020-10-06 NOTE — Telephone Encounter (Signed)
Scheduled appt per 9/1 los. Called pt, no answer. Left msg with appt date and time.

## 2020-10-06 NOTE — Telephone Encounter (Signed)
Oral Oncology Pharmacist Encounter  Notified by Dr. Mickeal Skinner that patient will be resuming treatment with Temodar (temozolomide) for the treatment of progressive anaplastic astrocytoma, planned duration until disease progression or unacceptable drug toxicity.  Prescription dose and frequency assessed for appropriateness. Appropriate for therapy initiation. Prior authorization is already on file for Temodar and is good through 04/20/2021.   CBC w/ Diff and CMP from 05/22/20 assessed, no relevant lab abnormalities noted. Patient will have repeat labs after upcoming cycle of Temodar.   I spoke with patient's wife for re-education of: Temodar (temozolomide). Reviewed administration, dosing, side effects, monitoring, drug-food interactions, safe handling, storage, and disposal.  Patient will take Temodar 174m capsules, 4024mtotal daily dose, by mouth once daily, may take at bedtime and on an empty stomach to decrease nausea and vomiting.  Patient will take Temodar daily for 5 days on, 23 days off, and repeated.  [place] date: [place], wife will call [facility] to set up fill on 10/06/20 (phone number: [phone]  Patient will take Zofran 74m94mablet, 1 tablet by mouth 30-60 min prior to Temodar dose to help decrease N/V. Request sent to Dr. VasMickeal Skinnerr new prescription of ondansetron as patient is out of antiemetics.   Adverse effects include but are not limited to: nausea, vomiting, anorexia, GI upset, rash, drug fever, and fatigue. Rare but serious adverse effects of pneumocystis pneumonia and secondary malignancy also discussed.  We discussed strategies to manage constipation if they occur secondary to ondansetron dosing.  PCP prophylaxis will not be initiated at this time, but may be added based on lymphocyte count in the future.  Reviewed importance of keeping a medication schedule and plan for any  missed doses. No barriers to medication adherence identified. Patient agreement for treatment documented in MD note on 10/05/20.  Medication reconciliation performed and medication/allergy list updated. Current medication list in Epic reviewed, no relevant/significant DDIs with Temodar identified.    All questions answered. Ms. PorMcglockliniced understanding and appreciation.   Medication education handout placed in mail for patient and wife. Patient's family knows to call the office with questions or concerns. Oral Chemotherapy Clinic phone number provided.   RebLeron CroakharmD, BCPS Hematology/Oncology Clinical Pharmacist [phone]/2022 1:20 PM

## 2020-10-16 ENCOUNTER — Inpatient Hospital Stay: Payer: BC Managed Care – PPO

## 2020-10-20 ENCOUNTER — Other Ambulatory Visit: Payer: Self-pay | Admitting: Internal Medicine

## 2020-10-20 DIAGNOSIS — C710 Malignant neoplasm of cerebrum, except lobes and ventricles: Secondary | ICD-10-CM

## 2020-11-02 ENCOUNTER — Inpatient Hospital Stay: Payer: BC Managed Care – PPO

## 2020-11-02 ENCOUNTER — Other Ambulatory Visit: Payer: Self-pay

## 2020-11-02 ENCOUNTER — Inpatient Hospital Stay: Payer: BC Managed Care – PPO | Admitting: Internal Medicine

## 2020-11-02 ENCOUNTER — Other Ambulatory Visit: Payer: Self-pay | Admitting: *Deleted

## 2020-11-02 VITALS — BP 129/82 | HR 68 | Temp 98.2°F | Resp 20 | Ht 72.0 in | Wt 184.3 lb

## 2020-11-02 DIAGNOSIS — C710 Malignant neoplasm of cerebrum, except lobes and ventricles: Secondary | ICD-10-CM

## 2020-11-02 DIAGNOSIS — R112 Nausea with vomiting, unspecified: Secondary | ICD-10-CM | POA: Diagnosis not present

## 2020-11-02 DIAGNOSIS — Z9221 Personal history of antineoplastic chemotherapy: Secondary | ICD-10-CM | POA: Diagnosis not present

## 2020-11-02 DIAGNOSIS — Z923 Personal history of irradiation: Secondary | ICD-10-CM | POA: Diagnosis not present

## 2020-11-02 DIAGNOSIS — Z79899 Other long term (current) drug therapy: Secondary | ICD-10-CM | POA: Diagnosis not present

## 2020-11-02 DIAGNOSIS — R5383 Other fatigue: Secondary | ICD-10-CM | POA: Diagnosis not present

## 2020-11-02 LAB — CBC WITH DIFFERENTIAL (CANCER CENTER ONLY)
Abs Immature Granulocytes: 0.03 10*3/uL (ref 0.00–0.07)
Basophils Absolute: 0 10*3/uL (ref 0.0–0.1)
Basophils Relative: 0 %
Eosinophils Absolute: 0.2 10*3/uL (ref 0.0–0.5)
Eosinophils Relative: 3 %
HCT: 47.2 % (ref 39.0–52.0)
Hemoglobin: 16.2 g/dL (ref 13.0–17.0)
Immature Granulocytes: 1 %
Lymphocytes Relative: 15 %
Lymphs Abs: 0.8 10*3/uL (ref 0.7–4.0)
MCH: 29.6 pg (ref 26.0–34.0)
MCHC: 34.3 g/dL (ref 30.0–36.0)
MCV: 86.3 fL (ref 80.0–100.0)
Monocytes Absolute: 0.5 10*3/uL (ref 0.1–1.0)
Monocytes Relative: 9 %
Neutro Abs: 4.1 10*3/uL (ref 1.7–7.7)
Neutrophils Relative %: 72 %
Platelet Count: 225 10*3/uL (ref 150–400)
RBC: 5.47 MIL/uL (ref 4.22–5.81)
RDW: 12.5 % (ref 11.5–15.5)
WBC Count: 5.7 10*3/uL (ref 4.0–10.5)
nRBC: 0 % (ref 0.0–0.2)

## 2020-11-02 LAB — CMP (CANCER CENTER ONLY)
ALT: 16 U/L (ref 0–44)
AST: 13 U/L — ABNORMAL LOW (ref 15–41)
Albumin: 4.3 g/dL (ref 3.5–5.0)
Alkaline Phosphatase: 80 U/L (ref 38–126)
Anion gap: 10 (ref 5–15)
BUN: 12 mg/dL (ref 6–20)
CO2: 27 mmol/L (ref 22–32)
Calcium: 10 mg/dL (ref 8.9–10.3)
Chloride: 102 mmol/L (ref 98–111)
Creatinine: 0.81 mg/dL (ref 0.61–1.24)
GFR, Estimated: >60 mL/min (ref >60)
Glucose, Bld: 95 mg/dL (ref 70–99)
Potassium: 4.6 mmol/L (ref 3.5–5.1)
Sodium: 139 mmol/L (ref 135–145)
Total Bilirubin: 0.8 mg/dL (ref 0.3–1.2)
Total Protein: 7.3 g/dL (ref 6.5–8.1)

## 2020-11-02 MED ORDER — TEMOZOLOMIDE 100 MG PO CAPS
400.0000 mg | ORAL_CAPSULE | Freq: Every day | ORAL | 0 refills | Status: DC
Start: 2020-11-02 — End: 2021-11-06

## 2020-11-02 NOTE — Progress Notes (Signed)
[facility] [place], [place] [address] [phone]   Interval Evaluation  Date of Service: 11/02/20 Patient Name: Aaron Espinoza Patient MRN: [id number] Patient DOB: [birth year] Provider: Ventura Sellers, MD  Identifying Statement:  Aaron Espinoza is a 44 y.o. male with  right thalamic  anaplastic astrocytoma   Oncologic History: Oncology History  Anaplastic astrocytoma of thalamus ([facility])  03/17/2019 Surgery   Stereotactic biopsy by Dr. Marcello Moores; path demonstrates anaplastic astrocytoma   04/12/2019 - 05/21/2019 Radiation Therapy   IMRT with concurrent Temodar 41m/m2 daily   06/14/2019 -  Chemotherapy    Patient is on Treatment Plan: BRAIN ANAPLASTIC GLIOMA GRADE III TEMOZOLOMIDE POST XRT Q28D         Biomarkers:  MGMT Unknown.  IDH 1/2 Wild type.  EGFR Unknown  TERT Unknown   Interval History:  Aaron CZERNIAKpresents to clinic for follow up after completing resumed 5-day Temodar, cycle #13.  Tolerated therapy well, one night of nausea after dosing was complete. Left sided weakness remains dense.  He continues to rely ull time on a four pointed walker, no longer able to use the cane for ambulation.   Shoulder pain is improved with sling and tramodol.  Fatigue is otherwise stable from prior.    H+P (03/30/19) Patient presented to medical attention in late January, when he noticed mild confusion, gait imbalance.  These symptoms worsened over the next ~10 days, and were accompanied by incontinence.  Brain MRI was performed and demonstrated a right thalamic mass consistent with likely primary brain tumor.  He underwent stereotactic biopsy and ventriculostomy on 03/17/19 with Dr. TMarcello Moores  Following surgery he describes considerable improvement in most of his symptoms; at this time he is near baseline function but "very fatigued" overall.  No seizures or headaches since surgery.  Medications: Current Outpatient Medications on  File Prior to Visit  Medication Sig Dispense Refill   acetaminophen (TYLENOL) 500 MG tablet Take 1,000 mg by mouth every 6 (six) hours as needed.     cyclobenzaprine (FLEXERIL) 5 MG tablet Take 1 tablet (5 mg total) by mouth 3 (three) times daily as needed for muscle spasms. 60 tablet 0   docusate sodium (COLACE) 100 MG capsule Take 100 mg by mouth daily.     gabapentin (NEURONTIN) 300 MG capsule Take 1 capsule (300 mg total) by mouth 3 (three) times daily. 90 capsule 3   hydrocortisone (CORTEF) 10 MG tablet Take 1 tablet (10 mg total) by mouth 2 (two) times daily. (Patient taking differently: Take 10 mg by mouth daily.) 60 tablet 2   ibuprofen (ADVIL) 200 MG tablet Take 800 mg by mouth every 6 (six) hours as needed.     lisinopril (ZESTRIL) 10 MG tablet TAKE 1 TABLET BY MOUTH EVERY DAY 90 tablet 1   NON FORMULARY Take 2 capsules by mouth daily. [employer] CORDYCEPS for Energy Support     NON FORMULARY Take 2 capsules by mouth daily. [employer] BRAIN for Mental Clarity     NON FORMULARY Take 2 capsules by mouth daily. [employer] STAMETS 7 for Daily Immune Support     NON FORMULARY Take 2 capsules by mouth daily. [employer] [place]TAIL for Immune Support     ondansetron (ZOFRAN) 8 MG tablet Take 1 tablet (8 mg total) by mouth 2 (two) times daily as needed (nausea and vomiting). May take 30-60 minutes prior to Temodar administration if nausea/vomiting occurs. 30 tablet  1   pantoprazole (PROTONIX) 40 MG tablet TAKE 1 TABLET BY MOUTH EVERY DAY 90 tablet 1   polyethylene glycol (MIRALAX / GLYCOLAX) 17 g packet Take 1 packet by mouth daily.     STOOL SOFTENER 100 MG capsule TAKE 1 TABLET BY MOUTH 2 TIMES EVERY DAY AT BEDTIME AS NEEDED (Patient not taking: Reported on 06/19/2020) 60 tablet 2   tamsulosin (FLOMAX) 0.4 MG CAPS capsule TAKE 1 CAPSULE (0.4 MG TOTAL) BY MOUTH DAILY AFTER SUPPER. 90 capsule 1   temozolomide (TEMODAR) 100 MG capsule TAKE 4 CAPSULES BY MOUTH 1 TIME A DAY AT BEDTIME FOR 5  DAYS OF 28 DAY CYCLE. (TOTAL DAILY DOSE OF 400MG) 20 capsule 0   traMADol (ULTRAM) 50 MG tablet Take 1 tablet (50 mg total) by mouth every 6 (six) hours as needed. 60 tablet 1   No current facility-administered medications on file prior to visit.    Allergies:  Allergies  Allergen Reactions   Gadolinium Derivatives Itching   Past Medical History:  Past Medical History:  Diagnosis Date   BPH (benign prostatic hyperplasia) 07/20/2015   Brain mass    Cognitive dysfunction 01/2019   Fatigue 01/2019   Headache    NF (neurofibromatosis) ([facility]) 07/20/2015   NF1   Past Surgical History:  Past Surgical History:  Procedure Laterality Date   HAND SURGERY Left    pinky - tumor benign   LEG SURGERY Left    inner thigh tumor - benign   VENTRICULOSTOMY N/A 03/17/2019   Procedure: ENDOSCOPIC THIRD VENTRICULOSTOMY;  Surgeon: Vallarie Mare, MD;  Location: [facility];  Service: Neurosurgery;  Laterality: N/A;  ENDOSCOPIC THIRD VENTRICULOSTOMY   Social History:  Social History   Socioeconomic History   Marital status: Married    Spouse name: Not on file   Number of children: Not on file   Years of education: Not on file   Highest education level: Not on file  Occupational History   Not on file  Tobacco Use   Smoking status: Former    Types: Cigars, Cigarettes   Smokeless tobacco: Never   Tobacco comments:    Quit smoking cigarettes 25 yrs ago, quit cigars in 2020  Vaping Use   Vaping Use: Never used  Substance and Sexual Activity   Alcohol use: Yes    Alcohol/week: 6.0 - 9.0 standard drinks    Types: 6 - 9 Standard drinks or equivalent per week   Drug use: No   Sexual activity: Not on file  Other Topics Concern   Not on file  Social History Narrative   Not on file   Social Determinants of Health   Financial Resource Strain: Not on file  Food Insecurity: Not on file  Transportation Needs: Not on file  Physical Activity: Not on file  Stress: Not on file  Social  Connections: Not on file  Intimate Partner Violence: Not on file   Family History:  Family History  Problem Relation Age of Onset   Cancer Paternal Grandfather        Prostate    Review of Systems: Constitutional: swelling in face Eyes: Doesn't report blurriness of vision Ears, nose, mouth, throat, and face: Doesn't report sore throat Respiratory: Doesn't report cough, dyspnea or wheezes Cardiovascular: Doesn't report palpitation, chest discomfort  Gastrointestinal:  Doesn't report nausea, constipation, diarrhea GU: difficulty initiating stream, nocturia Skin: Doesn't report skin rashes Neurological: Per HPI Musculoskeletal: Doesn't report joint pain Behavioral/Psych: Doesn't report anxiety  Physical Exam: Vitals:  11/02/20 1150  BP: 129/82  Pulse: 68  Resp: 20  Temp: 98.2 F (36.8 C)  SpO2: 100%    KPS: 60. General: cushingoid features Head: Normal EENT: No conjunctival injection or scleral icterus.  Lungs: Resp effort normal Cardiac: Regular rate Abdomen: Non-distended abdomen Skin: No rashes cyanosis or petechiae. Extremities: No clubbing or edema  Neurologic Exam: Mental Status: Awake, alert, attentive to examiner. Oriented to self and environment. Language is fluent with intact comprehension.  Cranial Nerves: Visual acuity is grossly normal. L visual field impairment. Extra-ocular movements intact. No ptosis. Face is symmetric Motor: Tone and bulk are normal. Power is 1/5 in left arm and 3/5 in left leg. Reflexes are symmetric, no pathologic reflexes present.  Sensory: Intact to light touch Gait: Hemiparetic, not independent  Labs: I have reviewed the data as listed    Component Value Date/Time   NA 138 05/22/2020 1349   K 3.9 05/22/2020 1349   CL 100 05/22/2020 1349   CO2 28 05/22/2020 1349   GLUCOSE 95 05/22/2020 1349   BUN 13 05/22/2020 1349   CREATININE 0.74 05/22/2020 1349   CREATININE 0.83 03/04/2019 1206   CALCIUM 9.5 05/22/2020 1349   PROT  6.9 05/22/2020 1349   ALBUMIN 4.0 05/22/2020 1349   AST 9 (L) 05/22/2020 1349   ALT 13 05/22/2020 1349   ALKPHOS 49 05/22/2020 1349   BILITOT 0.6 05/22/2020 1349   GFRNONAA >60 05/22/2020 1349   GFRNONAA 106 03/04/2019 1206   GFRAA >60 10/25/2019 1209   GFRAA 123 03/04/2019 1206   Lab Results  Component Value Date   WBC 8.3 05/22/2020   NEUTROABS 6.0 05/22/2020   HGB 16.2 05/22/2020   HCT 45.6 05/22/2020   MCV 87.7 05/22/2020   PLT 233 05/22/2020   Imaging:  [facility] Clinician Interpretation: I have personally reviewed the CNS images as listed.  My interpretation, in the context of the patient's clinical presentation, is progressive disease  MR BRAIN W WO CONTRAST  Result Date: 10/05/2020 CLINICAL DATA:  CNS neoplasm, assess treatment response EXAM: MRI HEAD WITHOUT AND WITH CONTRAST TECHNIQUE: Multiplanar, multiecho pulse sequences of the brain and surrounding structures were obtained without and with intravenous contrast. CONTRAST:  5m GADAVIST GADOBUTROL 1 MMOL/ML IV SOLN COMPARISON:  06/16/2020 FINDINGS: Brain: Heterogeneous mass centered at the right thalamus with involvement of the upper midbrain, ependyma at and around the cerebral aqua duct, and extending upward along the cortical spinal radiations. Irregular shape defies reproducible measurement. The extent is unchanged from before except for a superior nodular projection at the level of the corona radiata which has greater cranial extent by coronal postcontrast images, a small change relative to the size of enhancing mass. T2 signal is unchanged. Local mass effect is unchanged. Despite the appearing only solid enhancement at the level of the cerebral aqua duct there is no change in ventricular volume or convincing hydrocephalus. No complicating hemorrhage, infarct, or extra-axial collection. Wallerian degeneration is seen descending the right brainstem. Vascular: Normal flow voids.  Normal vessel enhancements. Skull and upper cervical  spine: Unremarkable right calvarial burr hole. Sinuses/Orbits: Negative IMPRESSION: 1. Subtly increased enhancing volume along the upper aspect of the mass at the level of the corona radiata. This change is minimal relative to the size of the mass. 2. Stable T2 signal and mass effect. 3. Stable ventricular volume despite the seemingly solid enhancement at the level of the upper cerebral aqua duct. Electronically Signed   By: JMonte FantasiaM.D.   On: 10/05/2020  10:12   MR SHOULDER LEFT W WO CONTRAST  Result Date: 10/06/2020 CLINICAL DATA:  Left shoulder pain EXAM: MRI OF THE LEFT SHOULDER WITHOUT AND WITH CONTRAST TECHNIQUE: Multiplanar, multisequence MR imaging of the shoulder shoulder was performed before and after the administration of intravenous contrast. CONTRAST:  24m GADAVIST GADOBUTROL 1 MMOL/ML IV SOLN COMPARISON:  None. FINDINGS: Severe patient motion degrades image quality limiting evaluation. Rotator cuff: Moderate tendinosis of the supraspinatus tendon with a small partial-thickness bursal surface tear. Infraspinatus tendon is intact. Teres minor tendon is intact. Subscapularis tendon is intact. Muscles: Mild muscle edema in the infraspinatus muscle likely reflecting muscle strain. No intramuscular fluid collection or hematoma. Biceps Long Head: Intraarticular and extraarticular portions of the biceps tendon are intact. Acromioclavicular Joint: Mild arthropathy of the acromioclavicular joint. No subacromial/subdeltoid bursal fluid. Glenohumeral Joint: No joint effusion. Partial-thickness cartilage loss of the glenohumeral joint. Labrum: Grossly intact, but evaluation is limited by lack of intraarticular fluid/contrast. Bones: No fracture or dislocation. No aggressive osseous lesion. Other: No fluid collection or hematoma. IMPRESSION: 1. Severe patient motion degrades image quality limiting evaluation. 2. Moderate tendinosis of the supraspinatus tendon with a small partial-thickness bursal surface  tear. 3. Mild infraspinatus muscle strain. Electronically Signed   By: HKathreen DevoidM.D.   On: 10/06/2020 06:54     Assessment/Plan Anaplastic astrocytoma of thalamus ([place] [C71.0]  Mr. PKoyis clinically stable today, now having completed cycle #13 of 5-day TMZ.  We recommended resuming treatment with cycle #14 Temozolomide 200 mg/m2, on for five days and off for twenty three days in twenty eight day cycles. The patient will have a complete blood count performed on days 21 and 28 of each cycle, and a comprehensive metabolic panel performed on day 28 of each cycle. Labs may need to be performed more often. Zofran will prescribed for home use for nausea/vomiting.   Informed consent was obtained verbally at bedside to proceed with oral chemotherapy.  Chemotherapy should be held for the following:  ANC less than 1,000  Platelets less than 100,000  LFT or creatinine greater than 2x ULN  If clinical concerns/contraindications develop  He will continue with home PT and OT.  Will con't Tramadol [id number] PRN for severe shoulder pain, in addition to the gabapentin.    We appreciate the opportunity to participate in the care of JaTYREL Espinoza  We ask that JaJALYN ROSEROeturn to clinic in 1 months with MRI brain for evaluation prior to cycle #15 of Temodar, or sooner as needed.  All questions were answered. The patient knows to call the clinic with any problems, questions or concerns. No barriers to learning were detected.  I have spent a total of 30 minutes of face-to-face and non-face-to-face time, excluding clinical staff time, preparing to see patient, ordering tests and/or medications, counseling the patient, and independently interpreting results and communicating results to the patient/family/caregiver   ZaVentura SellersMD Medical Director of Neuro-Oncology [facility] WeBrenas9/29/22 11:46 AM

## 2020-11-03 ENCOUNTER — Telehealth: Payer: Self-pay | Admitting: Internal Medicine

## 2020-11-03 NOTE — Telephone Encounter (Signed)
Scheduled appt per 9/29 los. Pt's wife is aware.

## 2020-11-17 ENCOUNTER — Other Ambulatory Visit: Payer: Self-pay | Admitting: Internal Medicine

## 2020-11-17 DIAGNOSIS — C710 Malignant neoplasm of cerebrum, except lobes and ventricles: Secondary | ICD-10-CM

## 2020-11-20 NOTE — Telephone Encounter (Signed)
Patient has to have labs and md visit prior to him reordering temozolomide.  Patient and wife know this protocol.

## 2020-11-30 ENCOUNTER — Other Ambulatory Visit: Payer: Self-pay | Admitting: Internal Medicine

## 2020-11-30 ENCOUNTER — Other Ambulatory Visit: Payer: Self-pay | Admitting: Radiation Therapy

## 2020-12-04 ENCOUNTER — Encounter: Payer: Self-pay | Admitting: Internal Medicine

## 2020-12-08 ENCOUNTER — Telehealth: Payer: Self-pay | Admitting: Internal Medicine

## 2020-12-08 NOTE — Telephone Encounter (Signed)
Sch per 11/4 inbasket, pt wife aware

## 2020-12-10 ENCOUNTER — Ambulatory Visit (HOSPITAL_COMMUNITY)
Admission: RE | Admit: 2020-12-10 | Discharge: 2020-12-10 | Disposition: A | Discharge status: Home / Self Care | Payer: BC Managed Care – PPO | Source: Ambulatory Visit | Attending: Internal Medicine | Admitting: Internal Medicine

## 2020-12-10 DIAGNOSIS — G9389 Other specified disorders of brain: Secondary | ICD-10-CM | POA: Diagnosis not present

## 2020-12-10 DIAGNOSIS — C710 Malignant neoplasm of cerebrum, except lobes and ventricles: Secondary | ICD-10-CM | POA: Diagnosis not present

## 2020-12-10 IMAGING — MR MR HEAD WO/W CM
15 series · 48 of 48 positions shown · IV contrast (gadavist)
Comparison: [DATE]

CLINICAL DATA: Brain/CNS neoplasm, assess treatment response;
anaplastic astrocytoma of thalamus; chemotherapy after last study

EXAM:
MRI HEAD WITHOUT AND WITH CONTRAST
TECHNIQUE: Multiplanar, multiecho pulse sequences of the brain and surrounding
structures were obtained without and with intravenous contrast.
CONTRAST:  8.5mL GADAVIST GADOBUTROL 1 MMOL/ML IV SOLN

[Series 5: DWI · axial · 3.0mm · 1.36mm/px · z∈[-41,+97]mm · 6 of 96 slices shown (1 of 2)]
[im 1/96]
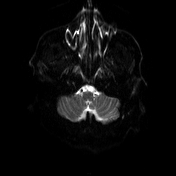
[im 20/96]
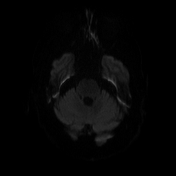
[im 39/96]
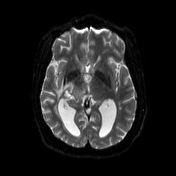
[im 58/96]
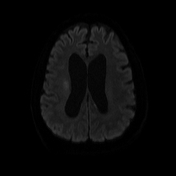
[im 77/96]
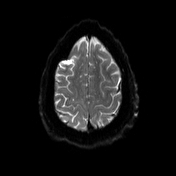
[im 96/96]
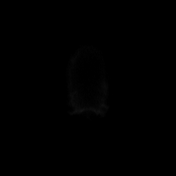

[Series 6: DWI · axial · 3.0mm · 1.36mm/px · z∈[-41,+97]mm · 3 of 48 slices shown (2 of 2)]
[im 1/48]
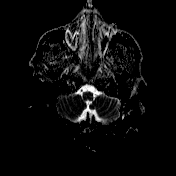
[im 24/48]
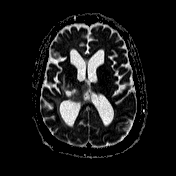
[im 48/48]
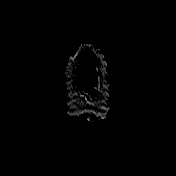

[Series 7: T1 · sagittal · 5.0mm · 0.75mm/px · 1 of 24 slices shown (1 of 4)]
[im 1/24]
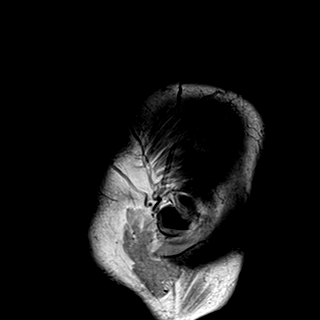

[Series 8: T2 · axial · 5.0mm · 0.62mm/px · 1 of 26 slices shown]
[im 1/26]
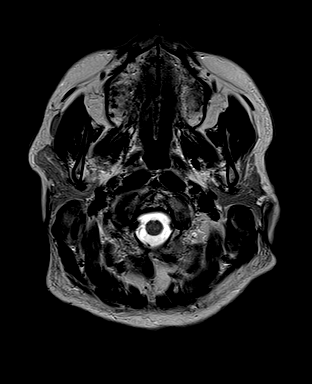

[Series 9: swi_images · axial · 3.0mm · 0.75mm/px · z∈[-77,+132]mm · 4 of 72 slices shown]
[im 1/72]
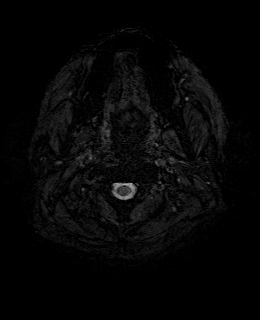
[im 24/72]
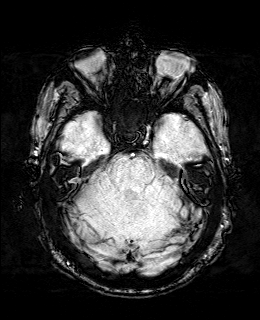
[im 48/72]
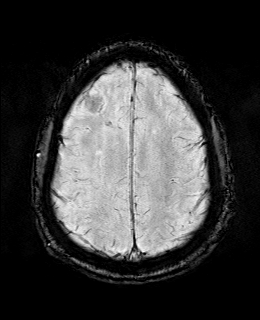
[im 72/72]
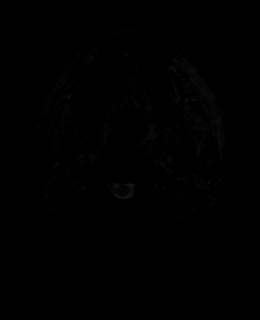

[Series 11: FLAIR · axial · 3.0mm · 0.75mm/px · z∈[-47,+103]mm · 3 of 52 slices shown]
[im 1/52]
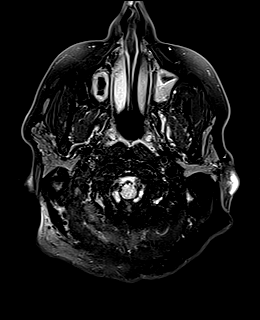
[im 26/52]
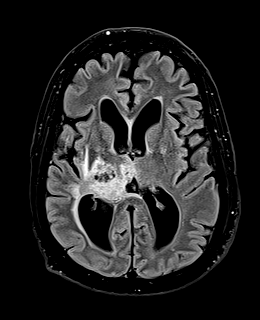
[im 52/52]
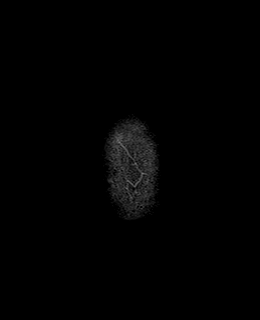

[Series 12: T1 · axial · 1.0mm · 0.94mm/px · z∈[-41,+99]mm · 8 of 144 slices shown (2 of 4)]
[im 1/144]
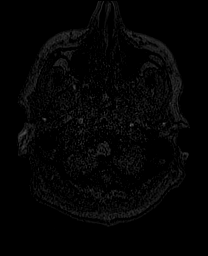
[im 21/144]
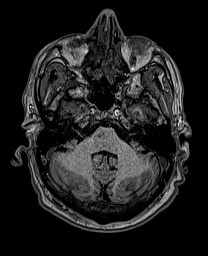
[im 41/144]
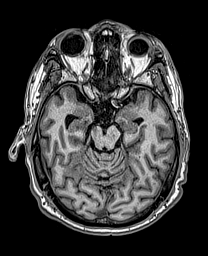
[im 62/144]
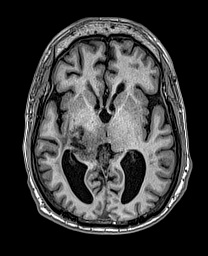
[im 82/144]
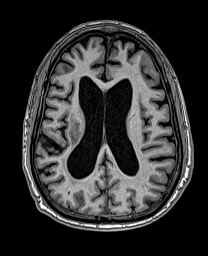
[im 103/144]
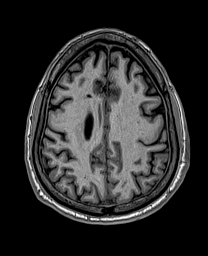
[im 123/144]
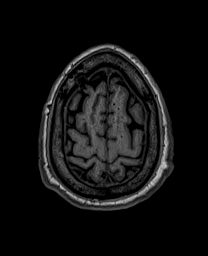
[im 144/144]
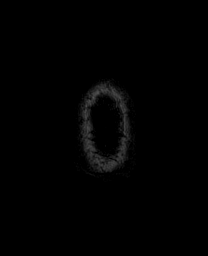

[Series 13: cor dwi_tracew · coronal · 5.0mm · 1.53mm/px · 3 of 60 slices shown]
[im 1/60]
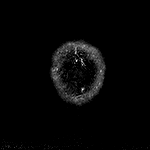
[im 30/60]
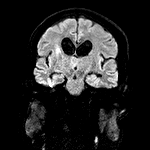
[im 60/60]
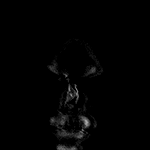

[Series 14: cor dwi_adc · coronal · 5.0mm · 1.53mm/px · 2 of 30 slices shown]
[im 1/30]
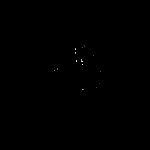
[im 30/30]
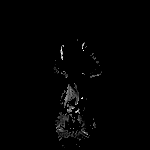

[Series 15: T1 post-contrast · axial · 1.0mm · 0.94mm/px · z∈[-41,+99]mm · 8 of 144 slices shown (1 of 3)]
[im 1/144]
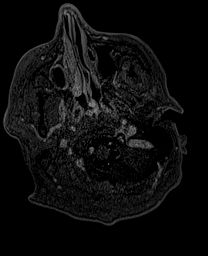
[im 21/144]
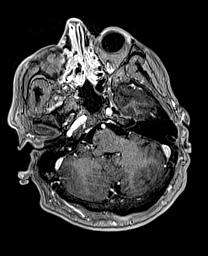
[im 41/144]
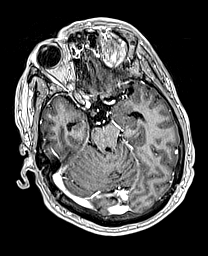
[im 62/144]
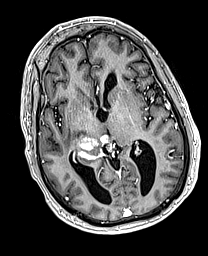
[im 82/144]
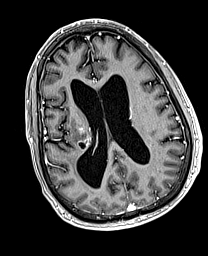
[im 103/144]
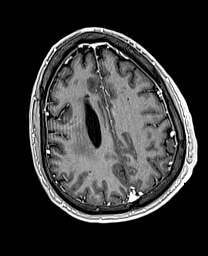
[im 123/144]
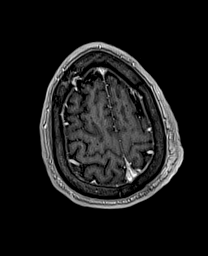
[im 144/144]
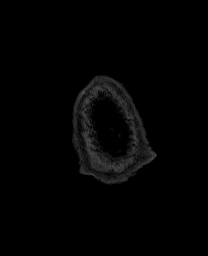

[Series 16: T1 · sagittal · 4.0mm · 0.94mm/px · 2 of 30 slices shown (3 of 4)]
[im 1/30]
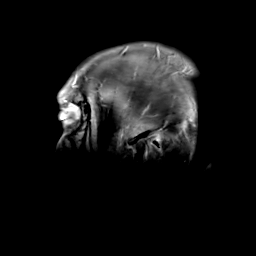
[im 30/30]
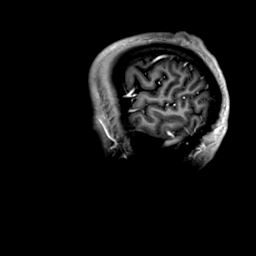

[Series 17: T1 · coronal · 4.0mm · 0.94mm/px · 2 of 30 slices shown (4 of 4)]
[im 1/30]
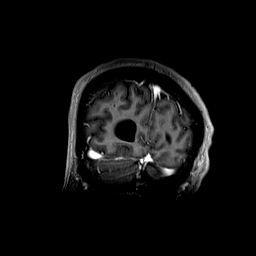
[im 30/30]
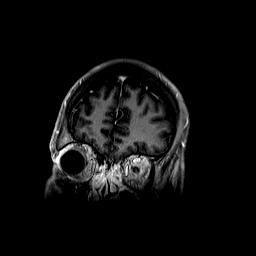

[Series 18: T2 post-contrast · coronal · 5.0mm · 0.57mm/px · 2 of 32 slices shown]
[im 1/32]
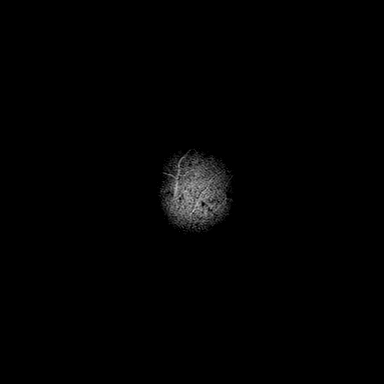
[im 32/32]
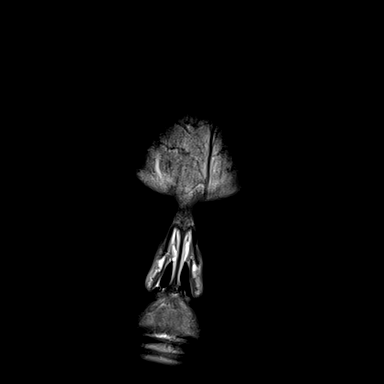

[Series 19: T1 post-contrast · coronal · 5.0mm · 0.43mm/px · 2 of 32 slices shown (2 of 3)]
[im 1/32]
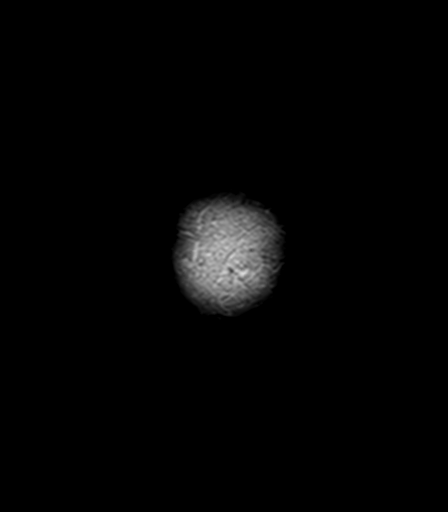
[im 32/32]
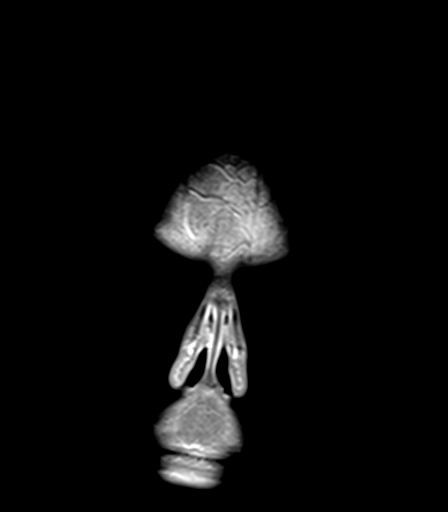

[Series 20: T1 post-contrast · sagittal · 5.0mm · 0.75mm/px · 1 of 24 slices shown (3 of 3)]
[im 1/24]
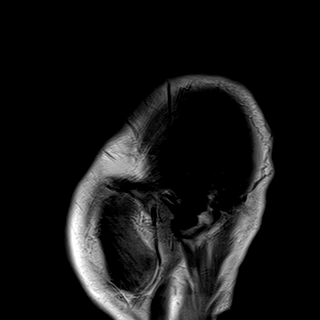

[48 of 48 positions shown; findings below may reference images not displayed]

FINDINGS: Brain: As before, there is a heterogeneous mass centered within the
right thalamic region extending into the right corona radiata,
midbrain, and ependymal margin along the posterior third ventricle
and cerebral aqueduct. Lesion is more necrotic and decreased in
size. Extent of abnormal T2 FLAIR hyperintensity in this region is
slightly decreased particularly in the right corona radiata region
(this had previously increased on the prior study).

There is no acute infarction.  Ventricles are stable in size.

Vascular: Major vessel flow voids at the skull base are preserved.

Skull and upper cervical spine: Normal marrow signal is preserved.

Sinuses/Orbits: Paranasal sinuses are aerated. Orbits are
unremarkable.

Other: Sella is unremarkable.  Mastoid air cells are clear.
IMPRESSION: Decreased size of lesion centered within the right thalamus. The
increased enhancement and T2 FLAIR hyperintensity in the right
corona radiata seen on the prior study has improved.

## 2020-12-10 MED ORDER — GADOBUTROL 1 MMOL/ML IV SOLN
8.5000 mL | Freq: Once | INTRAVENOUS | Status: AC | PRN
  Administered 2020-12-10: 8.5 mL via INTRAVENOUS

## 2020-12-11 ENCOUNTER — Inpatient Hospital Stay: Payer: BC Managed Care – PPO | Attending: Internal Medicine

## 2020-12-11 DIAGNOSIS — C710 Malignant neoplasm of cerebrum, except lobes and ventricles: Secondary | ICD-10-CM | POA: Insufficient documentation

## 2020-12-12 ENCOUNTER — Inpatient Hospital Stay: Payer: BC Managed Care – PPO | Admitting: Internal Medicine

## 2020-12-12 ENCOUNTER — Inpatient Hospital Stay: Payer: BC Managed Care – PPO

## 2020-12-12 ENCOUNTER — Encounter: Payer: Self-pay | Admitting: Internal Medicine

## 2020-12-14 ENCOUNTER — Telehealth: Payer: Self-pay | Admitting: Internal Medicine

## 2020-12-14 ENCOUNTER — Inpatient Hospital Stay (HOSPITAL_BASED_OUTPATIENT_CLINIC_OR_DEPARTMENT_OTHER): Payer: BC Managed Care – PPO | Admitting: Internal Medicine

## 2020-12-14 ENCOUNTER — Inpatient Hospital Stay: Payer: BC Managed Care – PPO

## 2020-12-14 DIAGNOSIS — C710 Malignant neoplasm of cerebrum, except lobes and ventricles: Secondary | ICD-10-CM | POA: Diagnosis not present

## 2020-12-14 NOTE — Progress Notes (Signed)
I connected with Aaron Espinoza on 12/14/20 at  9:30 AM EST by telephone visit and verified that I am speaking with the correct person using two identifiers.  I discussed the limitations, risks, security and privacy concerns of performing an evaluation and management service by telemedicine and the availability of in-person appointments. I also discussed with the patient that there may be a patient responsible charge related to this service. The patient expressed understanding and agreed to proceed.  Other persons participating in the visit and their role in the encounter:  spouse  Patient's location:  Home  Provider's location:  Office  Chief Complaint:  Anaplastic astrocytoma of thalamus ([facility])  History of Present Ilness: Aaron Espinoza describes continuation of cold symptoms, preventing in person visit today.  He describes congestion and sore throat, denies fever, shortness of breath.  Wife also has same "bug". No change in left sided weakness, they had a good visit to [place] and [place] at the end of October.  No issues with the Temodar, or with MRI brain. Observations: Language and cognition at baseline.  Imaging:  [place] Clinician Interpretation: I have personally reviewed the CNS images as listed.  My interpretation, in the context of the patient's clinical presentation, is stable disease  MR BRAIN W WO CONTRAST  Result Date: 12/11/2020 CLINICAL DATA:  Brain/CNS neoplasm, assess treatment response; anaplastic astrocytoma of thalamus; chemotherapy after last study EXAM: MRI HEAD WITHOUT AND WITH CONTRAST TECHNIQUE: Multiplanar, multiecho pulse sequences of the brain and surrounding structures were obtained without and with intravenous contrast. CONTRAST:  8.82m GADAVIST GADOBUTROL 1 MMOL/ML IV SOLN COMPARISON:  10/03/2020 FINDINGS: Brain: As before, there is a heterogeneous mass centered within the right thalamic region extending into the right corona radiata, midbrain, and ependymal  margin along the posterior third ventricle and cerebral aqueduct. Lesion is more necrotic and decreased in size. Extent of abnormal T2 FLAIR hyperintensity in this region is slightly decreased particularly in the right corona radiata region (this had previously increased on the prior study). There is no acute infarction.  Ventricles are stable in size. Vascular: Major vessel flow voids at the skull base are preserved. Skull and upper cervical spine: Normal marrow signal is preserved. Sinuses/Orbits: Paranasal sinuses are aerated. Orbits are unremarkable. Other: Sella is unremarkable.  Mastoid air cells are clear. IMPRESSION: Decreased size of lesion centered within the right thalamus. The increased enhancement and T2 FLAIR hyperintensity in the right corona radiata seen on the prior study has improved. Electronically Signed   By: PMacy MisM.D.   On: 12/11/2020 09:49    Assessment and Plan:  Anaplastic astrocytoma of thalamus ([facility])  Clinically stable, but has viral URI currently.  We reviewed encouraging results of his recent MRI, showing positive response to resumption of 5-day Temodar.  Follow Up Instructions: Once improved from URI standpoint, he will return to clinic next week with labs for review prior to moving onto next cycle of chemo  I discussed the assessment and treatment plan with the patient.  The patient was provided an opportunity to ask questions and all were answered.  The patient agreed with the plan and demonstrated understanding of the instructions.    The patient was advised to call back or seek an in-person evaluation if the symptoms worsen or if the condition fails to improve as anticipated.  I provided 5-10 minutes of non-face-to-face time during this enocunter.  ZVentura Sellers MD   I provided 15 minutes of non face-to-face telephone visit time  during this encounter, and > 50% was spent counseling as documented under my assessment & plan.

## 2020-12-14 NOTE — Telephone Encounter (Signed)
Scheduled per 11/10 los, pt has been called and wife confirmed appt

## 2020-12-21 ENCOUNTER — Other Ambulatory Visit: Payer: BC Managed Care – PPO

## 2020-12-21 ENCOUNTER — Inpatient Hospital Stay: Payer: BC Managed Care – PPO | Admitting: Internal Medicine

## 2020-12-21 ENCOUNTER — Inpatient Hospital Stay: Payer: BC Managed Care – PPO

## 2020-12-21 ENCOUNTER — Ambulatory Visit: Payer: BC Managed Care – PPO | Admitting: Internal Medicine

## 2020-12-26 ENCOUNTER — Other Ambulatory Visit: Payer: Self-pay

## 2020-12-26 ENCOUNTER — Inpatient Hospital Stay: Payer: BC Managed Care – PPO

## 2020-12-26 ENCOUNTER — Inpatient Hospital Stay: Payer: BC Managed Care – PPO | Admitting: Internal Medicine

## 2020-12-26 VITALS — BP 107/66 | HR 71 | Temp 98.1°F | Resp 17 | Ht 72.0 in | Wt 184.1 lb

## 2020-12-26 DIAGNOSIS — C710 Malignant neoplasm of cerebrum, except lobes and ventricles: Secondary | ICD-10-CM | POA: Diagnosis not present

## 2020-12-26 LAB — CMP (CANCER CENTER ONLY)
ALT: 18 U/L (ref 0–44)
AST: 12 U/L — ABNORMAL LOW (ref 15–41)
Albumin: 4 g/dL (ref 3.5–5.0)
Alkaline Phosphatase: 95 U/L (ref 38–126)
Anion gap: 10 (ref 5–15)
BUN: 14 mg/dL (ref 6–20)
CO2: 26 mmol/L (ref 22–32)
Calcium: 9.3 mg/dL (ref 8.9–10.3)
Chloride: 104 mmol/L (ref 98–111)
Creatinine: 0.81 mg/dL (ref 0.61–1.24)
GFR, Estimated: >60 mL/min (ref >60)
Glucose, Bld: 95 mg/dL (ref 70–99)
Potassium: 4.2 mmol/L (ref 3.5–5.1)
Sodium: 140 mmol/L (ref 135–145)
Total Bilirubin: 0.6 mg/dL (ref 0.3–1.2)
Total Protein: 7.1 g/dL (ref 6.5–8.1)

## 2020-12-26 LAB — CBC WITH DIFFERENTIAL (CANCER CENTER ONLY)
Abs Immature Granulocytes: 0.02 10*3/uL (ref 0.00–0.07)
Basophils Absolute: 0 10*3/uL (ref 0.0–0.1)
Basophils Relative: 1 %
Eosinophils Absolute: 0.4 10*3/uL (ref 0.0–0.5)
Eosinophils Relative: 7 %
HCT: 43.8 % (ref 39.0–52.0)
Hemoglobin: 15.5 g/dL (ref 13.0–17.0)
Immature Granulocytes: 0 %
Lymphocytes Relative: 17 %
Lymphs Abs: 0.9 10*3/uL (ref 0.7–4.0)
MCH: 30.6 pg (ref 26.0–34.0)
MCHC: 35.4 g/dL (ref 30.0–36.0)
MCV: 86.4 fL (ref 80.0–100.0)
Monocytes Absolute: 0.6 10*3/uL (ref 0.1–1.0)
Monocytes Relative: 10 %
Neutro Abs: 3.4 10*3/uL (ref 1.7–7.7)
Neutrophils Relative %: 65 %
Platelet Count: 255 10*3/uL (ref 150–400)
RBC: 5.07 MIL/uL (ref 4.22–5.81)
RDW: 12.6 % (ref 11.5–15.5)
WBC Count: 5.3 10*3/uL (ref 4.0–10.5)
nRBC: 0 % (ref 0.0–0.2)

## 2020-12-26 MED ORDER — METHYLPHENIDATE HCL 5 MG PO TABS: 5.0000 mg | ORAL_TABLET | Freq: Two times a day (BID) | ORAL | 0 refills | Status: DC

## 2020-12-26 MED ORDER — TEMOZOLOMIDE 100 MG PO CAPS
400.0000 mg | ORAL_CAPSULE | Freq: Every day | ORAL | 0 refills | Status: DC
Start: 2020-12-26 — End: 2021-11-05

## 2020-12-26 NOTE — Progress Notes (Signed)
[facility] at [facility] [place], [place] [address] [phone]   Interval Evaluation  Date of Service: 12/26/20 Patient Name: Aaron Espinoza Patient MRN: [id number] Patient DOB: 07/09/1975 Provider: Ventura Sellers, MD  Identifying Statement:  Aaron Espinoza is a 45 y.o. male with  right thalamic  anaplastic astrocytoma   Oncologic History: Oncology History  Anaplastic astrocytoma of thalamus ([facility])  03/17/2019 Surgery   Stereotactic biopsy by Dr. Marcello Moores; path demonstrates anaplastic astrocytoma   04/12/2019 - 05/21/2019 Radiation Therapy   IMRT with concurrent Temodar 94m/m2 daily   06/14/2019 -  Chemotherapy   Patient is on Treatment Plan : BRAIN ANAPLASTIC GLIOMA GRADE III Temozolomide Post XRT q28d       Biomarkers:  MGMT Unknown.  IDH 1/2 Wild type.  EGFR Unknown  TERT Unknown   Interval History:  JFURQAN GOSSELINpresents to clinic for follow up after completing resumed 5-day Temodar, cycle #14.  No issues with nausea this month, he has now recovered from cold/flu illness described previously.  Fatigue is worse in recent weeks, he is sleeping more and is more "drained".  Left sided weakness remains dense.  He continues to rely ull time on a four pointed walker, no longer able to use the cane for ambulation.   Shoulder pain is improved with sling and tramodol.    H+P (03/30/19) Patient presented to medical attention in late January, when he noticed mild confusion, gait imbalance.  These symptoms worsened over the next ~10 days, and were accompanied by incontinence.  Brain MRI was performed and demonstrated a right thalamic mass consistent with likely primary brain tumor.  He underwent stereotactic biopsy and ventriculostomy on 03/17/19 with Dr. TMarcello Moores  Following surgery he describes considerable improvement in most of his symptoms; at this time he is near baseline function but "very fatigued" overall.  No seizures or headaches since  surgery.  Medications: Current Outpatient Medications on File Prior to Visit  Medication Sig Dispense Refill   acetaminophen (TYLENOL) 500 MG tablet Take 1,000 mg by mouth every 6 (six) hours as needed.     cyclobenzaprine (FLEXERIL) 5 MG tablet Take 1 tablet (5 mg total) by mouth 3 (three) times daily as needed for muscle spasms. 60 tablet 0   docusate sodium (COLACE) 100 MG capsule Take 100 mg by mouth daily.     gabapentin (NEURONTIN) 300 MG capsule Take 1 capsule (300 mg total) by mouth 3 (three) times daily. 90 capsule 3   hydrocortisone (CORTEF) 10 MG tablet Take 1 tablet (10 mg total) by mouth 2 (two) times daily. (Patient taking differently: Take 10 mg by mouth daily.) 60 tablet 2   ibuprofen (ADVIL) 200 MG tablet Take 800 mg by mouth every 6 (six) hours as needed.     lisinopril (ZESTRIL) 10 MG tablet TAKE 1 TABLET BY MOUTH EVERY DAY 90 tablet 1   NON FORMULARY Take 2 capsules by mouth daily. [employer] CORDYCEPS for Energy Support     NON FORMULARY Take 2 capsules by mouth daily. [employer] BRAIN for Mental Clarity     NON FORMULARY Take 2 capsules by mouth daily. [employer] STAMETS 7 for Daily Immune Support     NON FORMULARY Take 2 capsules by mouth daily. Host Defense [place] for Immune Support     ondansetron (ZOFRAN) 8 MG tablet Take 1 tablet (8 mg total) by mouth 2 (two) times daily as needed (nausea and vomiting). May take  30-60 minutes prior to Temodar administration if nausea/vomiting occurs. 30 tablet 1   pantoprazole (PROTONIX) 40 MG tablet TAKE 1 TABLET BY MOUTH EVERY DAY 90 tablet 1   polyethylene glycol (MIRALAX / GLYCOLAX) 17 g packet Take 1 packet by mouth daily.     STOOL SOFTENER 100 MG capsule TAKE 1 TABLET BY MOUTH 2 TIMES EVERY DAY AT BEDTIME AS NEEDED (Patient not taking: Reported on 06/19/2020) 60 tablet 2   tamsulosin (FLOMAX) 0.4 MG CAPS capsule TAKE 1 CAPSULE (0.4 MG TOTAL) BY MOUTH DAILY AFTER SUPPER. 90 capsule 1   temozolomide (TEMODAR) 100 MG  capsule TAKE 4 CAPSULES BY MOUTH 1 TIME A DAY AT BEDTIME FOR 5 DAYS OF 28 DAY CYCLE. (TOTAL DAILY DOSE OF 400MG) 20 capsule 0   temozolomide (TEMODAR) 100 MG capsule Take 4 capsules (400 mg total) by mouth daily. 20 capsule 0   traMADol (ULTRAM) 50 MG tablet Take 1 tablet (50 mg total) by mouth every 6 (six) hours as needed. 60 tablet 1   No current facility-administered medications on file prior to visit.    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/2015   NF1   Past Surgical History:  Past Surgical History:  Procedure Laterality Date   HAND SURGERY Left    pinky - tumor benign   LEG SURGERY Left    inner thigh tumor - benign   VENTRICULOSTOMY N/A 03/17/2019   Procedure: ENDOSCOPIC THIRD VENTRICULOSTOMY;  Surgeon: Vallarie Mare, MD;  Location: [facility];  Service: Neurosurgery;  Laterality: N/A;  ENDOSCOPIC THIRD VENTRICULOSTOMY   Social History:  Social History   Socioeconomic History   Marital status: Married    Spouse name: Not on file   Number of children: Not on file   Years of education: Not on file   Highest education level: Not on file  Occupational History   Not on file  Tobacco Use   Smoking status: Former    Types: Cigars, Cigarettes   Smokeless tobacco: Never   Tobacco comments:    Quit smoking cigarettes 25 yrs ago, quit cigars in 2020  Vaping Use   Vaping Use: Never used  Substance and Sexual Activity   Alcohol use: Yes    Alcohol/week: 6.0 - 9.0 standard drinks    Types: 6 - 9 Standard drinks or equivalent per week   Drug use: No   Sexual activity: Not on file  Other Topics Concern   Not on file  Social History Narrative   Not on file   Social Determinants of Health   Financial  Resource Strain: Not on file  Food Insecurity: Not on file  Transportation Needs: Not on file  Physical Activity: Not on file  Stress: Not on file  Social Connections: Not on file  Intimate Partner Violence: Not on file   Family History:  Family History  Problem Relation Age of Onset   Cancer Paternal Grandfather        Prostate    Review of Systems: Constitutional: swelling in face Eyes: Doesn't report blurriness of vision Ears, nose, mouth, throat, and face: Doesn't report sore throat Respiratory: Doesn't report cough, dyspnea or wheezes Cardiovascular: Doesn't report palpitation, chest discomfort  Gastrointestinal:  Doesn't report nausea,  constipation, diarrhea GU: difficulty initiating stream, nocturia Skin: Doesn't report skin rashes Neurological: Per HPI Musculoskeletal: Doesn't report joint pain Behavioral/Psych: Doesn't report anxiety  Physical Exam: Vitals:   12/26/20 1241  BP: 107/66  Pulse: 71  Resp: 17  Temp: 98.1 F (36.7 C)  SpO2: 97%   KPS: 60. General: normal Head: Normal EENT: No conjunctival injection or scleral icterus.  Lungs: Resp effort normal Cardiac: Regular rate Abdomen: Non-distended abdomen Skin: No rashes cyanosis or petechiae. Extremities: No clubbing or edema  Neurologic Exam: Mental Status: Awake, alert, attentive to examiner. Oriented to self and environment. Language is fluent with intact comprehension.  Cranial Nerves: Visual acuity is grossly normal. L visual field impairment. Extra-ocular movements intact. No ptosis. Face is symmetric Motor: Tone and bulk are normal. Power is 1/5 in left arm and 3/5 in left leg. Reflexes are symmetric, no pathologic reflexes present.  Sensory: Intact to light touch Gait: Hemiparetic, not independent  Labs: I have reviewed the data as listed    Component Value Date/Time   NA 139 11/02/2020 1141   K 4.6 11/02/2020 1141   CL 102 11/02/2020 1141   CO2 27 11/02/2020 1141   GLUCOSE 95  11/02/2020 1141   BUN 12 11/02/2020 1141   CREATININE 0.81 11/02/2020 1141   CREATININE 0.83 03/04/2019 1206   CALCIUM 10.0 11/02/2020 1141   PROT 7.3 11/02/2020 1141   ALBUMIN 4.3 11/02/2020 1141   AST 13 (L) 11/02/2020 1141   ALT 16 11/02/2020 1141   ALKPHOS 80 11/02/2020 1141   BILITOT 0.8 11/02/2020 1141   GFRNONAA >60 11/02/2020 1141   GFRNONAA 106 03/04/2019 1206   GFRAA >60 10/25/2019 1209   GFRAA 123 03/04/2019 1206   Lab Results  Component Value Date   WBC 5.3 12/26/2020   NEUTROABS 3.4 12/26/2020   HGB 15.5 12/26/2020   HCT 43.8 12/26/2020   MCV 86.4 12/26/2020   PLT 255 12/26/2020   Imaging:  [place] Clinician Interpretation: I have personally reviewed the CNS images as listed.  My interpretation, in the context of the patient's clinical presentation, is progressive disease  MR BRAIN W WO CONTRAST  Result Date: 12/11/2020 CLINICAL DATA:  Brain/CNS neoplasm, assess treatment response; anaplastic astrocytoma of thalamus; chemotherapy after last study EXAM: MRI HEAD WITHOUT AND WITH CONTRAST TECHNIQUE: Multiplanar, multiecho pulse sequences of the brain and surrounding structures were obtained without and with intravenous contrast. CONTRAST:  8.92m GADAVIST GADOBUTROL 1 MMOL/ML IV SOLN COMPARISON:  10/03/2020 FINDINGS: Brain: As before, there is a heterogeneous mass centered within the right thalamic region extending into the right corona radiata, midbrain, and ependymal margin along the posterior third ventricle and cerebral aqueduct. Lesion is more necrotic and decreased in size. Extent of abnormal T2 FLAIR hyperintensity in this region is slightly decreased particularly in the right corona radiata region (this had previously increased on the prior study). There is no acute infarction.  Ventricles are stable in size. Vascular: Major vessel flow voids at the skull base are preserved. Skull and upper cervical spine: Normal marrow signal is preserved. Sinuses/Orbits: Paranasal  sinuses are aerated. Orbits are unremarkable. Other: Sella is unremarkable.  Mastoid air cells are clear. IMPRESSION: Decreased size of lesion centered within the right thalamus. The increased enhancement and T2 FLAIR hyperintensity in the right corona radiata seen on the prior study has improved. Electronically Signed   By: PMacy MisM.D.   On: 12/11/2020 09:49     Assessment/Plan Anaplastic astrocytoma of thalamus ([place] [C71.0]  Mr. PLizarraga  is clinically stable today, now having completed cycle #14 of 5-day TMZ.  We recommended resuming treatment with cycle #15 Temozolomide 200 mg/m2, on for five days and off for twenty three days in twenty eight day cycles. The patient will have a complete blood count performed on days 21 and 28 of each cycle, and a comprehensive metabolic panel performed on day 28 of each cycle. Labs may need to be performed more often. Zofran will prescribed for home use for nausea/vomiting.   Chemotherapy should be held for the following:  ANC less than 1,000  Platelets less than 100,000  LFT or creatinine greater than 2x ULN  If clinical concerns/contraindications develop  He will continue with home PT and OT.  For fatigue/energy issues, will recommend trial of ritalin 4m BID.  Could also consider resuming low dose hydrocortisone, tolerated previously.  We appreciate the opportunity to participate in the care of JDAYMOND CORDTS   We ask that JZYIERE ROSEMONDreturn to clinic in 1 months with MRI brain for evaluation prior to cycle #16 of Temodar, or sooner as needed.  Next MRI can be in 2 months.  All questions were answered. The patient knows to call the clinic with any problems, questions or concerns. No barriers to learning were detected.  I have spent a total of 30 minutes of face-to-face and non-face-to-face time, excluding clinical staff time, preparing to see patient, ordering tests and/or medications, counseling the patient, and independently  interpreting results and communicating results to the patient/family/caregiver   ZVentura Sellers MD Medical Director of Neuro-Oncology [facility]11/22/22 12:26 PM

## 2020-12-27 ENCOUNTER — Telehealth: Payer: Self-pay | Admitting: Internal Medicine

## 2020-12-27 NOTE — Telephone Encounter (Signed)
Scheduled per 11/22 los, pt has been called and wife confirmed appt

## 2021-01-02 ENCOUNTER — Encounter: Payer: Self-pay | Admitting: Internal Medicine

## 2021-01-30 ENCOUNTER — Inpatient Hospital Stay: Payer: BC Managed Care – PPO | Admitting: Internal Medicine

## 2021-01-30 ENCOUNTER — Inpatient Hospital Stay: Payer: BC Managed Care – PPO | Attending: Internal Medicine

## 2021-01-30 ENCOUNTER — Other Ambulatory Visit: Payer: Self-pay

## 2021-01-30 VITALS — BP 106/65 | HR 73 | Temp 97.7°F | Resp 17 | Ht 72.0 in | Wt 184.7 lb

## 2021-01-30 DIAGNOSIS — C710 Malignant neoplasm of cerebrum, except lobes and ventricles: Secondary | ICD-10-CM | POA: Insufficient documentation

## 2021-01-30 DIAGNOSIS — Z79899 Other long term (current) drug therapy: Secondary | ICD-10-CM | POA: Insufficient documentation

## 2021-01-30 DIAGNOSIS — Z87891 Personal history of nicotine dependence: Secondary | ICD-10-CM | POA: Diagnosis not present

## 2021-01-30 LAB — CBC WITH DIFFERENTIAL (CANCER CENTER ONLY)
Abs Immature Granulocytes: 0.02 10*3/uL (ref 0.00–0.07)
Basophils Absolute: 0 10*3/uL (ref 0.0–0.1)
Basophils Relative: 1 %
Eosinophils Absolute: 0.4 10*3/uL (ref 0.0–0.5)
Eosinophils Relative: 7 %
HCT: 44.7 % (ref 39.0–52.0)
Hemoglobin: 15.8 g/dL (ref 13.0–17.0)
Immature Granulocytes: 0 %
Lymphocytes Relative: 14 %
Lymphs Abs: 0.8 10*3/uL (ref 0.7–4.0)
MCH: 30.7 pg (ref 26.0–34.0)
MCHC: 35.3 g/dL (ref 30.0–36.0)
MCV: 87 fL (ref 80.0–100.0)
Monocytes Absolute: 0.5 10*3/uL (ref 0.1–1.0)
Monocytes Relative: 9 %
Neutro Abs: 3.8 10*3/uL (ref 1.7–7.7)
Neutrophils Relative %: 69 %
Platelet Count: 210 10*3/uL (ref 150–400)
RBC: 5.14 MIL/uL (ref 4.22–5.81)
RDW: 12.4 % (ref 11.5–15.5)
WBC Count: 5.6 10*3/uL (ref 4.0–10.5)
nRBC: 0 % (ref 0.0–0.2)

## 2021-01-30 LAB — CMP (CANCER CENTER ONLY)
ALT: 16 U/L (ref 0–44)
AST: 11 U/L — ABNORMAL LOW (ref 15–41)
Albumin: 4.3 g/dL (ref 3.5–5.0)
Alkaline Phosphatase: 78 U/L (ref 38–126)
Anion gap: 5 (ref 5–15)
BUN: 10 mg/dL (ref 6–20)
CO2: 29 mmol/L (ref 22–32)
Calcium: 9.7 mg/dL (ref 8.9–10.3)
Chloride: 104 mmol/L (ref 98–111)
Creatinine: 0.67 mg/dL (ref 0.61–1.24)
GFR, Estimated: >60 mL/min (ref >60)
Glucose, Bld: 95 mg/dL (ref 70–99)
Potassium: 4.5 mmol/L (ref 3.5–5.1)
Sodium: 138 mmol/L (ref 135–145)
Total Bilirubin: 0.7 mg/dL (ref 0.3–1.2)
Total Protein: 7 g/dL (ref 6.5–8.1)

## 2021-01-30 MED ORDER — METHYLPHENIDATE HCL 20 MG PO TABS: 20.0000 mg | ORAL_TABLET | Freq: Every day | ORAL | 0 refills | Status: DC

## 2021-01-30 MED ORDER — TEMOZOLOMIDE 100 MG PO CAPS: 400.0000 mg | ORAL_CAPSULE | Freq: Every day | ORAL | 0 refills | Status: DC

## 2021-01-30 MED ORDER — LISINOPRIL 10 MG PO TABS: 5.0000 mg | ORAL_TABLET | Freq: Every day | ORAL | 1 refills | Status: DC

## 2021-01-30 NOTE — Progress Notes (Signed)
[facility] at [facility] [address] [place], [place] [address] [phone]   Interval Evaluation  Date of Service: 01/30/21 Patient Name: Aaron Espinoza Patient MRN: [id number] Patient DOB: September 03, 1975 Provider: Ventura Sellers, MD  Identifying Statement:  Aaron Espinoza is a 45 y.o. male with  right thalamic  anaplastic astrocytoma   Oncologic History: Oncology History  Anaplastic astrocytoma of thalamus ([facility])  03/17/2019 Surgery   Stereotactic biopsy by Dr. Marcello Moores; path demonstrates anaplastic astrocytoma   04/12/2019 - 05/21/2019 Radiation Therapy   IMRT with concurrent Temodar 70m/m2 daily   06/14/2019 -  Chemotherapy   Patient is on Treatment Plan : BRAIN ANAPLASTIC GLIOMA GRADE III Temozolomide Post XRT q28d       Biomarkers:  MGMT Unknown.  IDH 1/2 Wild type.  EGFR Unknown  TERT Unknown   Interval History:  Aaron MCNAYpresents to clinic for follow up after completing resumed 5-day Temodar, cycle #15.  No issues with nausea this month, he has now recovered from cold/flu illness described previously.  Fatigue is worse in recent weeks, he is sleeping more and is more "drained".  Ritalin as prescribed has not been helpful.  Left sided weakness remains dense.  He continues to rely full time on a four pointed walker, no longer able to use the cane for ambulation.   Shoulder pain is improved with sling and tramodol.    H+P (03/30/19) Patient presented to medical attention in late January, when he noticed mild confusion, gait imbalance.  These symptoms worsened over the next ~10 days, and were accompanied by incontinence.  Brain MRI was performed and demonstrated a right thalamic mass consistent with likely primary brain tumor.  He underwent stereotactic biopsy and ventriculostomy on 03/17/19 with Dr. TMarcello Moores  Following surgery he describes considerable improvement in most of his symptoms; at this time he is near baseline function but "very  fatigued" overall.  No seizures or headaches since surgery.  Medications: Current Outpatient Medications on File Prior to Visit  Medication Sig Dispense Refill   acetaminophen (TYLENOL) 500 MG tablet Take 1,000 mg by mouth every 6 (six) hours as needed.     cyclobenzaprine (FLEXERIL) 5 MG tablet Take 1 tablet (5 mg total) by mouth 3 (three) times daily as needed for muscle spasms. 60 tablet 0   docusate sodium (COLACE) 100 MG capsule Take 100 mg by mouth daily.     gabapentin (NEURONTIN) 300 MG capsule Take 1 capsule (300 mg total) by mouth 3 (three) times daily. 90 capsule 3   ibuprofen (ADVIL) 200 MG tablet Take 800 mg by mouth every 6 (six) hours as needed.     lisinopril (ZESTRIL) 10 MG tablet TAKE 1 TABLET BY MOUTH EVERY DAY 90 tablet 1   methylphenidate (RITALIN) 5 MG tablet Take 1 tablet (5 mg total) by mouth 2 (two) times daily. 60 tablet 0   NON FORMULARY Take 2 capsules by mouth daily. [employer] CORDYCEPS for Energy Support     NON FORMULARY Take 2 capsules by mouth daily. [employer] BRAIN for Mental Clarity     NON FORMULARY Take 2 capsules by mouth daily. [employer] STAMETS 7 for Daily Immune Support     NON FORMULARY Take 2 capsules by mouth daily. [employer] [place]IL for Immune Support     ondansetron (ZOFRAN) 8 MG tablet Take 1 tablet (8 mg total) by mouth 2 (two) times daily as needed (nausea and vomiting). May take 30-60  minutes prior to Temodar administration if nausea/vomiting occurs. 30 tablet 1   pantoprazole (PROTONIX) 40 MG tablet TAKE 1 TABLET BY MOUTH EVERY DAY 90 tablet 1   polyethylene glycol (MIRALAX / GLYCOLAX) 17 g packet Take 1 packet by mouth daily.     STOOL SOFTENER 100 MG capsule TAKE 1 TABLET BY MOUTH 2 TIMES EVERY DAY AT BEDTIME AS NEEDED 60 tablet 2   tamsulosin (FLOMAX) 0.4 MG CAPS capsule TAKE 1 CAPSULE (0.4 MG TOTAL) BY MOUTH DAILY AFTER SUPPER. 90 capsule 1   hydrocortisone (CORTEF) 10 MG tablet Take 1 tablet (10 mg total) by mouth 2 (two)  times daily. (Patient not taking: Reported on 01/30/2021) 60 tablet 2   temozolomide (TEMODAR) 100 MG capsule TAKE 4 CAPSULES BY MOUTH 1 TIME A DAY AT BEDTIME FOR 5 DAYS OF 28 DAY CYCLE. (TOTAL DAILY DOSE OF 400MG) (Patient not taking: Reported on 01/30/2021) 20 capsule 0   temozolomide (TEMODAR) 100 MG capsule Take 4 capsules (400 mg total) by mouth daily. (Patient not taking: Reported on 01/30/2021) 20 capsule 0   temozolomide (TEMODAR) 100 MG capsule Take 4 capsules (400 mg total) by mouth daily. (Patient not taking: Reported on 01/30/2021) 20 capsule 0   traMADol (ULTRAM) 50 MG tablet Take 1 tablet (50 mg total) by mouth every 6 (six) hours as needed. (Patient not taking: Reported on 01/30/2021) 60 tablet 1   No current facility-administered medications on file prior to visit.    Allergies:  Allergies  Allergen Reactions   Gadolinium Derivatives Itching   Past Medical History:  Past Medical History:  Diagnosis Date   BPH (benign prostatic hyperplasia) 07/20/2015   Brain mass    Cognitive dysfunction 01/2019   Fatigue 01/2019   Headache    NF (neurofibromatosis) ([place]) 07/20/2015   NF1   Past Surgical History:  Past Surgical History:  Procedure Laterality Date   HAND SURGERY Left    pinky - tumor benign   LEG SURGERY Left    inner thigh tumor - benign   VENTRICULOSTOMY N/A 03/17/2019   Procedure: ENDOSCOPIC THIRD VENTRICULOSTOMY;  Surgeon: Vallarie Mare, MD;  Location: [facility];  Service: Neurosurgery;  Laterality: N/A;  ENDOSCOPIC THIRD VENTRICULOSTOMY   Social History:  Social History   Socioeconomic History   Marital status: Married    Spouse name: Not on file   Number of children: Not on file   Years of education: Not on file   Highest education level: Not on file  Occupational History   Not on file  Tobacco Use   Smoking status: Former    Types: Cigars, Cigarettes   Smokeless tobacco: Never   Tobacco comments:    Quit smoking cigarettes 25 yrs ago, quit  cigars in 2020  Vaping Use   Vaping Use: Never used  Substance and Sexual Activity   Alcohol use: Yes    Alcohol/week: 6.0 - 9.0 standard drinks    Types: 6 - 9 Standard drinks or equivalent per week   Drug use: No   Sexual activity: Not on file  Other Topics Concern   Not on file  Social History Narrative   Not on file   Social Determinants of Health   Financial Resource Strain: Not on file  Food Insecurity: Not on file  Transportation Needs: Not on file  Physical Activity: Not on file  Stress: Not on file  Social Connections: Not on file  Intimate Partner Violence: Not on file   Family History:  Family History  Problem Relation Age of Onset   Cancer Paternal Grandfather        Prostate    Review of Systems: Constitutional: swelling in face Eyes: Doesn't report blurriness of vision Ears, nose, mouth, throat, and face: Doesn't report sore throat Respiratory: Doesn't report cough, dyspnea or wheezes Cardiovascular: Doesn't report palpitation, chest discomfort  Gastrointestinal:  Doesn't report nausea, constipation, diarrhea GU: difficulty initiating stream, nocturia Skin: Doesn't report skin rashes Neurological: Per HPI Musculoskeletal: Doesn't report joint pain Behavioral/Psych: Doesn't report anxiety  Physical Exam: Vitals:   01/30/21 1222  BP: 106/65  Pulse: 73  Resp: 17  Temp: 97.7 F (36.5 C)  SpO2: 100%   KPS: 60. General: normal Head: Normal EENT: No conjunctival injection or scleral icterus.  Lungs: Resp effort normal Cardiac: Regular rate Abdomen: Non-distended abdomen Skin: No rashes cyanosis or petechiae. Extremities: No clubbing or edema  Neurologic Exam: Mental Status: Awake, alert, attentive to examiner. Oriented to self and environment. Language is fluent with intact comprehension.  Cranial Nerves: Visual acuity is grossly normal. L visual field impairment. Extra-ocular movements intact. No ptosis. Face is symmetric Motor: Tone and bulk  are normal. Power is 1/5 in left arm and 3/5 in left leg. Reflexes are symmetric, no pathologic reflexes present.  Sensory: Intact to light touch Gait: Hemiparetic, not independent  Labs: I have reviewed the data as listed    Component Value Date/Time   NA 138 01/30/2021 1220   K 4.5 01/30/2021 1220   CL 104 01/30/2021 1220   CO2 29 01/30/2021 1220   GLUCOSE 95 01/30/2021 1220   BUN 10 01/30/2021 1220   CREATININE 0.67 01/30/2021 1220   CREATININE 0.83 03/04/2019 1206   CALCIUM 9.7 01/30/2021 1220   PROT 7.0 01/30/2021 1220   ALBUMIN 4.3 01/30/2021 1220   AST 11 (L) 01/30/2021 1220   ALT 16 01/30/2021 1220   ALKPHOS 78 01/30/2021 1220   BILITOT 0.7 01/30/2021 1220   GFRNONAA >60 01/30/2021 1220   GFRNONAA 106 03/04/2019 1206   GFRAA >60 10/25/2019 1209   GFRAA 123 03/04/2019 1206   Lab Results  Component Value Date   WBC 5.6 01/30/2021   NEUTROABS 3.8 01/30/2021   HGB 15.8 01/30/2021   HCT 44.7 01/30/2021   MCV 87.0 01/30/2021   PLT 210 01/30/2021     Assessment/Plan Anaplastic astrocytoma of thalamus ([facility]) [C71.0]  Aaron Espinoza is clinically stable today, now having completed cycle #15 of 5-day TMZ.  We recommended resuming treatment with cycle #16 Temozolomide 200 mg/m2, on for five days and off for twenty three days in twenty eight day cycles. The patient will have a complete blood count performed on days 21 and 28 of each cycle, and a comprehensive metabolic panel performed on day 28 of each cycle. Labs may need to be performed more often. Zofran will prescribed for home use for nausea/vomiting.   Chemotherapy should be held for the following:  ANC less than 1,000  Platelets less than 100,000  LFT or creatinine greater than 2x ULN  If clinical concerns/contraindications develop  He will continue with home PT and OT.  For fatigue/energy issues, will increase ritalin to 48m daily.  May also resume hydocortisone 173mBID.  We appreciate the opportunity to  participate in the care of Aaron Espinoza  We ask that Aaron Espinoza to clinic in 1 months with MRI brain for evaluation prior to cycle #17 of Temodar, or sooner as needed.   All questions were answered. The  patient knows to call the clinic with any problems, questions or concerns. No barriers to learning were detected.  I have spent a total of 30 minutes of face-to-face and non-face-to-face time, excluding clinical staff time, preparing to see patient, ordering tests and/or medications, counseling the patient, and independently interpreting results and communicating results to the patient/family/caregiver   Ventura Sellers, MD Medical Director of Neuro-Oncology [facility] 01/30/21 12:54 PM

## 2021-01-30 NOTE — Progress Notes (Signed)
[facility] [place], [place] [address] [phone]   Interval Evaluation  Date of Service: 01/30/21 Patient Name: Aaron Espinoza Patient MRN: [id number] Patient DOB: [birth year] Provider: Ventura Sellers, MD  Identifying Statement:  Aaron Espinoza is a 45 y.o. male with  right thalamic  anaplastic astrocytoma   Oncologic History: Oncology History  Anaplastic astrocytoma of thalamus ([place])  03/17/2019 Surgery   Stereotactic biopsy by Dr. Marcello Moores; path demonstrates anaplastic astrocytoma   04/12/2019 - 05/21/2019 Radiation Therapy   IMRT with concurrent Temodar 69m/m2 daily   06/14/2019 -  Chemotherapy   Patient is on Treatment Plan : BRAIN ANAPLASTIC GLIOMA GRADE III Temozolomide Post XRT q28d       Biomarkers:  MGMT Unknown.  IDH 1/2 Wild type.  EGFR Unknown  TERT Unknown   Interval History:  Aaron GRUENBERGpresents to clinic for follow up after completing resumed 5-day Temodar, cycle #15.  No issues with chemo this month, no further illness.  Fatigue continues to be significant.  Left sided weakness remains dense.  He continues to rely ull time on a four pointed walker, no longer able to use the cane for ambulation.   Shoulder pain is improved with sling and tramodol.    H+P (03/30/19) Patient presented to medical attention in late January, when he noticed mild confusion, gait imbalance.  These symptoms worsened over the next ~10 days, and were accompanied by incontinence.  Brain MRI was performed and demonstrated a right thalamic mass consistent with likely primary brain tumor.  He underwent stereotactic biopsy and ventriculostomy on 03/17/19 with Dr. TMarcello Moores  Following surgery he describes considerable improvement in most of his symptoms; at this time he is near baseline function but "very fatigued" overall.  No seizures or headaches since surgery.  Medications: Current Outpatient Medications on File Prior to Visit   Medication Sig Dispense Refill   acetaminophen (TYLENOL) 500 MG tablet Take 1,000 mg by mouth every 6 (six) hours as needed.     cyclobenzaprine (FLEXERIL) 5 MG tablet Take 1 tablet (5 mg total) by mouth 3 (three) times daily as needed for muscle spasms. 60 tablet 0   docusate sodium (COLACE) 100 MG capsule Take 100 mg by mouth daily.     gabapentin (NEURONTIN) 300 MG capsule Take 1 capsule (300 mg total) by mouth 3 (three) times daily. 90 capsule 3   hydrocortisone (CORTEF) 10 MG tablet Take 1 tablet (10 mg total) by mouth 2 (two) times daily. (Patient taking differently: Take 10 mg by mouth daily.) 60 tablet 2   ibuprofen (ADVIL) 200 MG tablet Take 800 mg by mouth every 6 (six) hours as needed.     lisinopril (ZESTRIL) 10 MG tablet TAKE 1 TABLET BY MOUTH EVERY DAY 90 tablet 1   methylphenidate (RITALIN) 5 MG tablet Take 1 tablet (5 mg total) by mouth 2 (two) times daily. 60 tablet 0   NON FORMULARY Take 2 capsules by mouth daily. [employer] CORDYCEPS for Energy Support     NON FORMULARY Take 2 capsules by mouth daily. [employer] BRAIN for Mental Clarity     NON FORMULARY Take 2 capsules by mouth daily. [employer] STAMETS 7 for Daily Immune Support     NON FORMULARY Take 2 capsules by mouth daily. [employer] TK[place]TAIL for Immune Support     ondansetron (ZOFRAN) 8 MG tablet Take 1 tablet (8 mg total) by mouth 2 (two) times daily  as needed (nausea and vomiting). May take 30-60 minutes prior to Temodar administration if nausea/vomiting occurs. 30 tablet 1   pantoprazole (PROTONIX) 40 MG tablet TAKE 1 TABLET BY MOUTH EVERY DAY 90 tablet 1   polyethylene glycol (MIRALAX / GLYCOLAX) 17 g packet Take 1 packet by mouth daily.     STOOL SOFTENER 100 MG capsule TAKE 1 TABLET BY MOUTH 2 TIMES EVERY DAY AT BEDTIME AS NEEDED (Patient not taking: Reported on 06/19/2020) 60 tablet 2   tamsulosin (FLOMAX) 0.4 MG CAPS capsule TAKE 1 CAPSULE (0.4 MG TOTAL) BY MOUTH DAILY AFTER SUPPER. 90 capsule 1    temozolomide (TEMODAR) 100 MG capsule TAKE 4 CAPSULES BY MOUTH 1 TIME A DAY AT BEDTIME FOR 5 DAYS OF 28 DAY CYCLE. (TOTAL DAILY DOSE OF 400MG) 20 capsule 0   temozolomide (TEMODAR) 100 MG capsule Take 4 capsules (400 mg total) by mouth daily. 20 capsule 0   temozolomide (TEMODAR) 100 MG capsule Take 4 capsules (400 mg total) by mouth daily. 20 capsule 0   traMADol (ULTRAM) 50 MG tablet Take 1 tablet (50 mg total) by mouth every 6 (six) hours as needed. 60 tablet 1   No current facility-administered medications on file prior to visit.    Allergies:  Allergies  Allergen Reactions   Gadolinium Derivatives Itching   Past Medical History:  Past Medical History:  Diagnosis Date   BPH (benign prostatic hyperplasia) 07/20/2015   Brain mass    Cognitive dysfunction 01/2019   Fatigue 01/2019   Headache    NF (neurofibromatosis) ([facility]) 07/20/2015   NF1   Past Surgical History:  Past Surgical History:  Procedure Laterality Date   HAND SURGERY Left    pinky - tumor benign   LEG SURGERY Left    inner thigh tumor - benign   VENTRICULOSTOMY N/A 03/17/2019   Procedure: ENDOSCOPIC THIRD VENTRICULOSTOMY;  Surgeon: Vallarie Mare, MD;  Location: [facility];  Service: Neurosurgery;  Laterality: N/A;  ENDOSCOPIC THIRD VENTRICULOSTOMY   Social History:  Social History   Socioeconomic History   Marital status: Married    Spouse name: Not on file   Number of children: Not on file   Years of education: Not on file   Highest education level: Not on file  Occupational History   Not on file  Tobacco Use   Smoking status: Former    Types: Cigars, Cigarettes   Smokeless tobacco: Never   Tobacco comments:    Quit smoking cigarettes 25 yrs ago, quit cigars in 2020  Vaping Use   Vaping Use: Never used  Substance and Sexual Activity   Alcohol use: Yes    Alcohol/week: 6.0 - 9.0 standard drinks    Types: 6 - 9 Standard drinks or equivalent per week   Drug use: No   Sexual activity: Not on file   Other Topics Concern   Not on file  Social History Narrative   Not on file   Social Determinants of Health   Financial Resource Strain: Not on file  Food Insecurity: Not on file  Transportation Needs: Not on file  Physical Activity: Not on file  Stress: Not on file  Social Connections: Not on file  Intimate Partner Violence: Not on file   Family History:  Family History  Problem Relation Age of Onset   Cancer Paternal Grandfather        Prostate    Review of Systems: Constitutional: swelling in face Eyes: Doesn't report blurriness of vision Ears, nose, mouth,  throat, and face: Doesn't report sore throat Respiratory: Doesn't report cough, dyspnea or wheezes Cardiovascular: Doesn't report palpitation, chest discomfort  Gastrointestinal:  Doesn't report nausea, constipation, diarrhea GU: difficulty initiating stream, nocturia Skin: Doesn't report skin rashes Neurological: Per HPI Musculoskeletal: Doesn't report joint pain Behavioral/Psych: Doesn't report anxiety  Physical Exam: There were no vitals filed for this visit.  KPS: 60. General: normal Head: Normal EENT: No conjunctival injection or scleral icterus.  Lungs: Resp effort normal Cardiac: Regular rate Abdomen: Non-distended abdomen Skin: No rashes cyanosis or petechiae. Extremities: No clubbing or edema  Neurologic Exam: Mental Status: Awake, alert, attentive to examiner. Oriented to self and environment. Language is fluent with intact comprehension.  Cranial Nerves: Visual acuity is grossly normal. L visual field impairment. Extra-ocular movements intact. No ptosis. Face is symmetric Motor: Tone and bulk are normal. Power is 1/5 in left arm and 3/5 in left leg. Reflexes are symmetric, no pathologic reflexes present.  Sensory: Intact to light touch Gait: Hemiparetic, not independent  Labs: I have reviewed the data as listed    Component Value Date/Time   NA 140 12/26/2020 1203   K 4.2 12/26/2020 1203    CL 104 12/26/2020 1203   CO2 26 12/26/2020 1203   GLUCOSE 95 12/26/2020 1203   BUN 14 12/26/2020 1203   CREATININE 0.81 12/26/2020 1203   CREATININE 0.83 03/04/2019 1206   CALCIUM 9.3 12/26/2020 1203   PROT 7.1 12/26/2020 1203   ALBUMIN 4.0 12/26/2020 1203   AST 12 (L) 12/26/2020 1203   ALT 18 12/26/2020 1203   ALKPHOS 95 12/26/2020 1203   BILITOT 0.6 12/26/2020 1203   GFRNONAA >60 12/26/2020 1203   GFRNONAA 106 03/04/2019 1206   GFRAA >60 10/25/2019 1209   GFRAA 123 03/04/2019 1206   Lab Results  Component Value Date   WBC 5.3 12/26/2020   NEUTROABS 3.4 12/26/2020   HGB 15.5 12/26/2020   HCT 43.8 12/26/2020   MCV 86.4 12/26/2020   PLT 255 12/26/2020     Assessment/Plan Anaplastic astrocytoma of thalamus (HCC) [C71.0]  Aaron Espinoza is clinically stable today, now having completed cycle #15 of 5-day TMZ.  We recommended resuming treatment with cycle #16 Temozolomide 200 mg/m2, on for five days and off for twenty three days in twenty eight day cycles. The patient will have a complete blood count performed on days 21 and 28 of each cycle, and a comprehensive metabolic panel performed on day 28 of each cycle. Labs may need to be performed more often. Zofran will prescribed for home use for nausea/vomiting.   Chemotherapy should be held for the following:  ANC less than 1,000  Platelets less than 100,000  LFT or creatinine greater than 2x ULN  If clinical concerns/contraindications develop  He will continue with home PT and OT.  For fatigue/energy issues, will recommend trial of ritalin 12m BID.  Could also consider resuming low dose hydrocortisone, tolerated previously.  We appreciate the opportunity to participate in the care of Aaron Espinoza   We ask that Aaron HILBURNreturn to clinic in 1 months with MRI brain for evaluation prior to cycle #17 of Temodar, or sooner as needed.  .  All questions were answered. The patient knows to call the clinic with any  problems, questions or concerns. No barriers to learning were detected.  I have spent a total of 30 minutes of face-to-face and non-face-to-face time, excluding clinical staff time, preparing to see patient, ordering tests and/or medications, counseling the patient, and  independently interpreting results and communicating results to the patient/family/caregiver   Ventura Sellers, MD Medical Director of Neuro-Oncology [facility] 01/30/21 12:22 PM

## 2021-01-31 ENCOUNTER — Encounter: Payer: Self-pay | Admitting: Internal Medicine

## 2021-02-01 ENCOUNTER — Telehealth: Payer: Self-pay | Admitting: Internal Medicine

## 2021-02-01 NOTE — Telephone Encounter (Signed)
Scheduled per 12/27 los, pt wife has been called and confirmed appt

## 2021-02-02 ENCOUNTER — Other Ambulatory Visit: Payer: Self-pay | Admitting: Internal Medicine

## 2021-02-02 MED ORDER — METHYLPHENIDATE HCL 20 MG PO TABS: 20.0000 mg | ORAL_TABLET | Freq: Every day | ORAL | 0 refills | Status: DC

## 2021-02-15 ENCOUNTER — Other Ambulatory Visit: Payer: Self-pay | Admitting: Internal Medicine

## 2021-02-15 DIAGNOSIS — C710 Malignant neoplasm of cerebrum, except lobes and ventricles: Secondary | ICD-10-CM

## 2021-02-15 NOTE — Telephone Encounter (Signed)
Patient has to have labs and see provider before renewing oral chemotherapy.  Patient is scheduled for 01.24.2023.

## 2021-02-20 ENCOUNTER — Other Ambulatory Visit: Payer: Self-pay | Admitting: Radiation Therapy

## 2021-02-24 ENCOUNTER — Ambulatory Visit (HOSPITAL_COMMUNITY)
Admission: RE | Admit: 2021-02-24 | Discharge: 2021-02-24 | Disposition: A | Discharge status: Home / Self Care | Payer: BC Managed Care – PPO | Source: Ambulatory Visit | Attending: Internal Medicine | Admitting: Internal Medicine

## 2021-02-24 ENCOUNTER — Other Ambulatory Visit: Payer: Self-pay | Admitting: Radiation Oncology

## 2021-02-24 DIAGNOSIS — R220 Localized swelling, mass and lump, head: Secondary | ICD-10-CM | POA: Diagnosis not present

## 2021-02-24 DIAGNOSIS — C710 Malignant neoplasm of cerebrum, except lobes and ventricles: Secondary | ICD-10-CM | POA: Diagnosis not present

## 2021-02-24 DIAGNOSIS — C719 Malignant neoplasm of brain, unspecified: Secondary | ICD-10-CM | POA: Diagnosis not present

## 2021-02-24 IMAGING — MR MR HEAD WO/W CM
15 series · 48 of 48 positions shown · IV contrast (gadavist)
Comparison: [DATE]

CLINICAL DATA: Brain/CNS neoplasm, assess treatment response;
anaplastic astrocytoma thalamus

EXAM:
MRI HEAD WITHOUT AND WITH CONTRAST
TECHNIQUE: Multiplanar, multiecho pulse sequences of the brain and surrounding
structures were obtained without and with intravenous contrast.
CONTRAST:  8mL GADAVIST GADOBUTROL 1 MMOL/ML IV SOLN

[Series 5: DWI · axial · 3.0mm · 1.36mm/px · z∈[-57,+83]mm · 6 of 96 slices shown (1 of 2)]
[im 1/96]
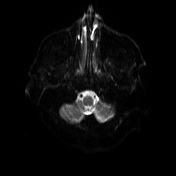
[im 20/96]
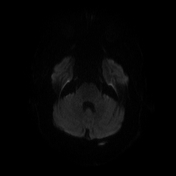
[im 39/96]
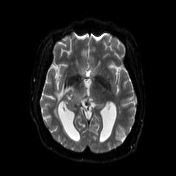
[im 58/96]
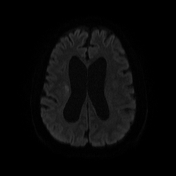
[im 77/96]
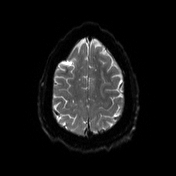
[im 96/96]
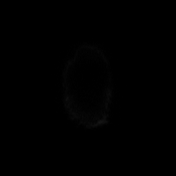

[Series 6: DWI · axial · 3.0mm · 1.36mm/px · z∈[-57,+83]mm · 3 of 48 slices shown (2 of 2)]
[im 1/48]
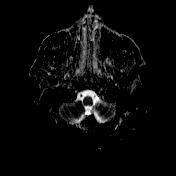
[im 24/48]
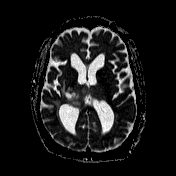
[im 48/48]
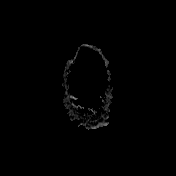

[Series 7: T1 · sagittal · 5.0mm · 0.75mm/px · 1 of 24 slices shown (1 of 4)]
[im 1/24]
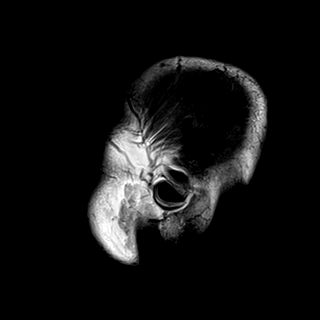

[Series 8: T2 · axial · 5.0mm · 0.62mm/px · 1 of 24 slices shown]
[im 1/24]
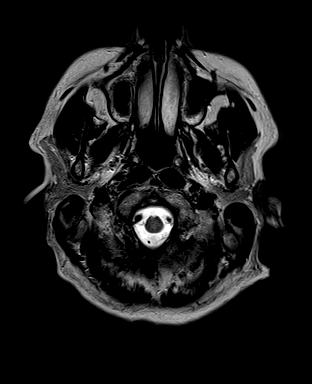

[Series 9: swi_images · axial · 3.0mm · 0.75mm/px · z∈[-66,+86]mm · 3 of 52 slices shown]
[im 1/52]
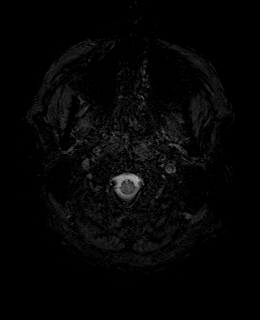
[im 26/52]
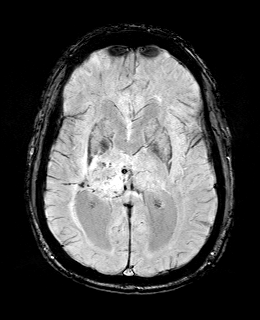
[im 52/52]
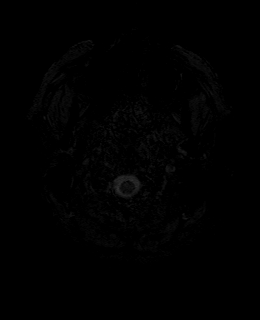

[Series 11: FLAIR · axial · 3.0mm · 0.75mm/px · z∈[-66,+86]mm · 3 of 52 slices shown]
[im 1/52]
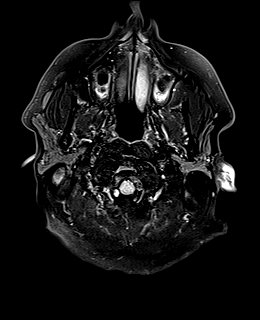
[im 26/52]
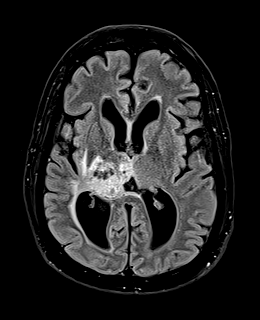
[im 52/52]
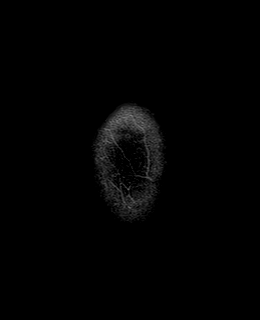

[Series 12: T1 · axial · 1.0mm · 0.94mm/px · z∈[-60,+81]mm · 8 of 144 slices shown (2 of 4)]
[im 1/144]
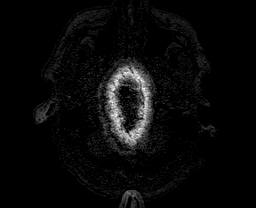
[im 21/144]
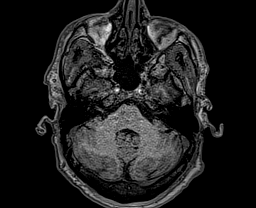
[im 41/144]
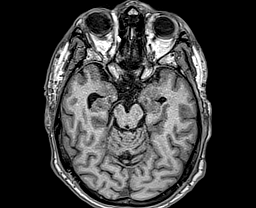
[im 62/144]
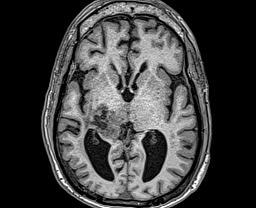
[im 82/144]
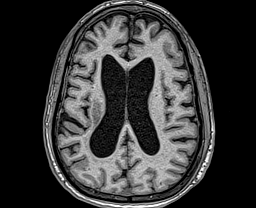
[im 103/144]
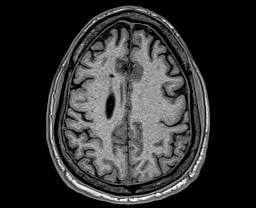
[im 123/144]
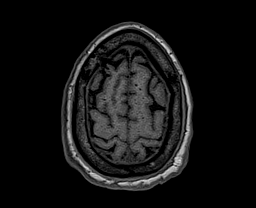
[im 144/144]
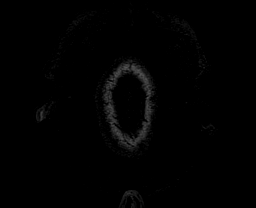

[Series 13: cor dwi_tracew · coronal · 5.0mm · 1.53mm/px · 4 of 60 slices shown]
[im 1/60]
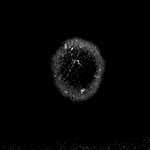
[im 20/60]
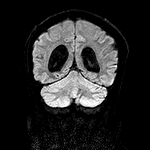
[im 40/60]
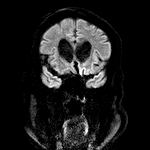
[im 60/60]
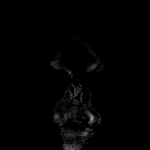

[Series 14: cor dwi_adc · coronal · 5.0mm · 1.53mm/px · 2 of 30 slices shown]
[im 1/30]
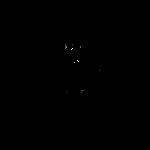
[im 30/30]
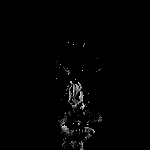

[Series 15: T2 post-contrast · coronal · 5.0mm · 0.57mm/px · 2 of 30 slices shown]
[im 1/30]
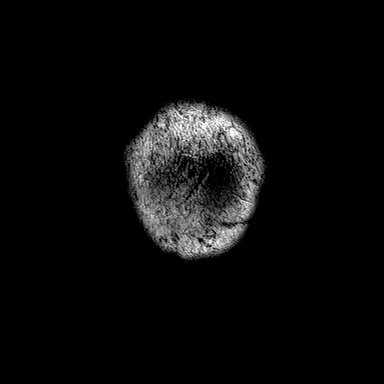
[im 30/30]
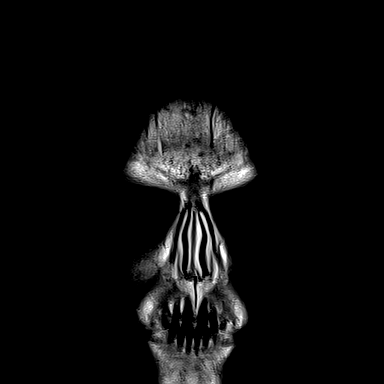

[Series 16: T1 post-contrast · axial · 1.0mm · 0.94mm/px · z∈[-60,+81]mm · 8 of 144 slices shown (1 of 3)]
[im 1/144]
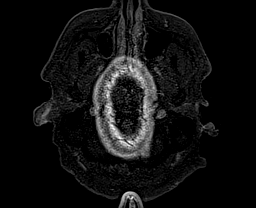
[im 21/144]
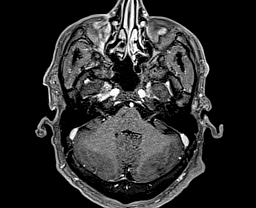
[im 41/144]
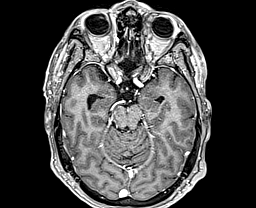
[im 62/144]
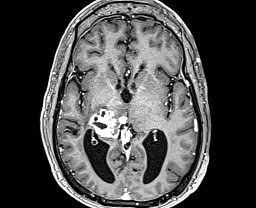
[im 82/144]
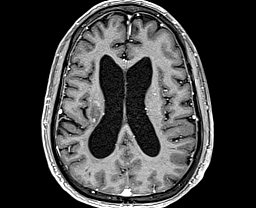
[im 103/144]
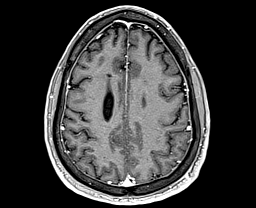
[im 123/144]
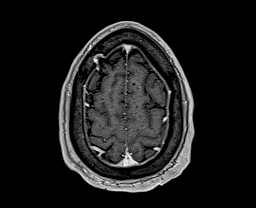
[im 144/144]
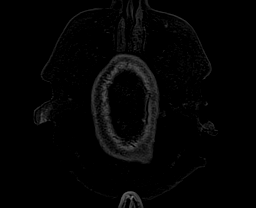

[Series 17: T1 · sagittal · 4.0mm · 0.94mm/px · 2 of 30 slices shown (3 of 4)]
[im 1/30]
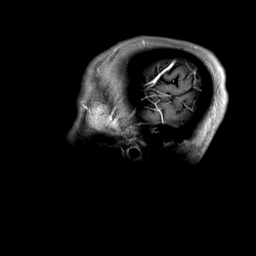
[im 30/30]
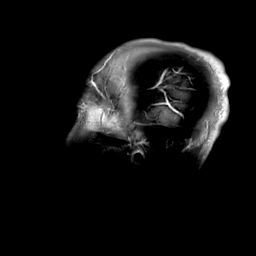

[Series 18: T1 · coronal · 4.0mm · 0.94mm/px · 2 of 30 slices shown (4 of 4)]
[im 1/30]
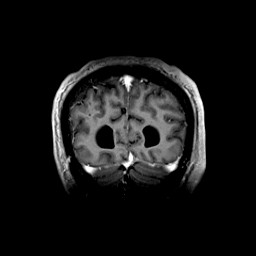
[im 30/30]
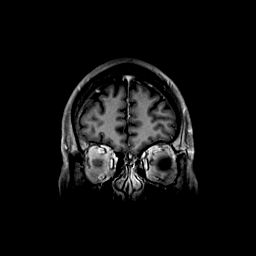

[Series 19: T1 post-contrast · coronal · 5.0mm · 0.43mm/px · 2 of 30 slices shown (2 of 3)]
[im 1/30]
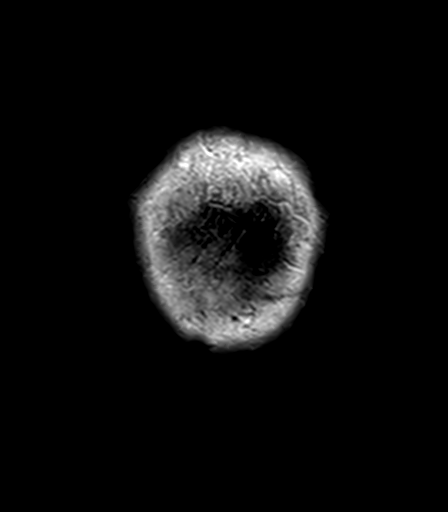
[im 30/30]
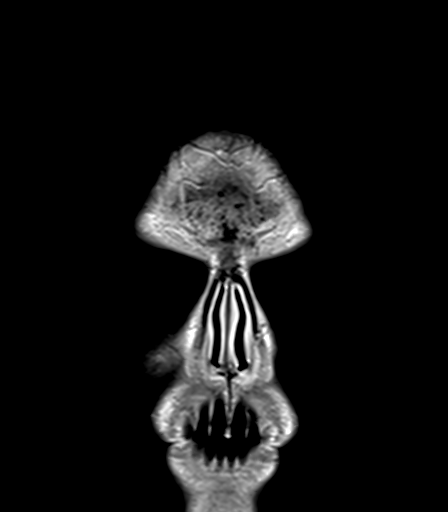

[Series 20: T1 post-contrast · sagittal · 5.0mm · 0.75mm/px · 1 of 24 slices shown (3 of 3)]
[im 1/24]
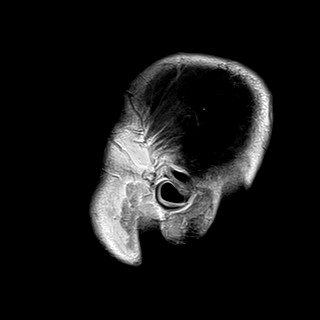

[48 of 48 positions shown; findings below may reference images not displayed]

FINDINGS: Brain: Heterogeneous mass is again identified centered within the
right thalamic region extending into the corona radiata, midbrain,
and ependymal margin along the posterior third ventricle and
cerebral aqueduct. Apart from some small changes in necrotic
portions, no substantial change in size. There is similar
surrounding abnormal T2 FLAIR hyperintensity in this region.

No acute infarction.  Ventricles are stable in size.

Vascular: Major vessel flow voids at the skull base are preserved.

Skull and upper cervical spine: Normal marrow signal is preserved.

Sinuses/Orbits: Paranasal sinuses are aerated. Orbits are
unremarkable.

Other: Sella is unremarkable.  Mastoid air cells are clear.
IMPRESSION: No substantial change in enhancing lesion and surrounding T2 FLAIR
hyperintensity since [DATE].

## 2021-02-24 MED ORDER — GADOBUTROL 1 MMOL/ML IV SOLN
8.0000 mL | Freq: Once | INTRAVENOUS | Status: AC | PRN
  Administered 2021-02-24: 8 mL via INTRAVENOUS

## 2021-02-26 ENCOUNTER — Encounter: Payer: Self-pay | Admitting: Internal Medicine

## 2021-02-26 ENCOUNTER — Inpatient Hospital Stay: Payer: BC Managed Care – PPO | Attending: Internal Medicine

## 2021-02-27 ENCOUNTER — Other Ambulatory Visit: Payer: Self-pay

## 2021-02-27 ENCOUNTER — Inpatient Hospital Stay (HOSPITAL_BASED_OUTPATIENT_CLINIC_OR_DEPARTMENT_OTHER): Payer: BC Managed Care – PPO | Admitting: Internal Medicine

## 2021-02-27 ENCOUNTER — Inpatient Hospital Stay: Payer: BC Managed Care – PPO

## 2021-02-27 DIAGNOSIS — C719 Malignant neoplasm of brain, unspecified: Secondary | ICD-10-CM

## 2021-02-27 DIAGNOSIS — C710 Malignant neoplasm of cerebrum, except lobes and ventricles: Secondary | ICD-10-CM

## 2021-02-27 MED ORDER — METHYLPHENIDATE HCL 20 MG PO TABS: 20.0000 mg | ORAL_TABLET | Freq: Two times a day (BID) | ORAL | 0 refills | Status: DC

## 2021-02-27 NOTE — Progress Notes (Signed)
I connected with Aaron Espinoza on 02/27/21 at 12:30 PM EST by telephone visit and verified that I am speaking with the correct person using two identifiers.  I discussed the limitations, risks, security and privacy concerns of performing an evaluation and management service by telemedicine and the availability of in-person appointments. I also discussed with the patient that there may be a patient responsible charge related to this service. The patient expressed understanding and agreed to proceed.  Other persons participating in the visit and their role in the encounter:  spouse  Patient's location:  Home  Provider's location:  Office  Chief Complaint:  Anaplastic astrocytoma ([place]) - Plan: MR BRAIN W WO CONTRAST  Anaplastic astrocytoma of thalamus ([place])  History of Present Ilness: [name] describes difficult time handling the chemo- significant increase in fatigue and lethargy week of treatment.  Following that, he feels improved with the ritalin, and has increased his activity overall.  He has concerns about continuing the chemo, in the near term, because of this dichotomy on/off treatment.  No seizures or headaches. Observations: Language and cognition at baseline  Imaging:  [facility] Clinician Interpretation: I have personally reviewed the CNS images as listed.  My interpretation, in the context of the patient's clinical presentation, is stable disease  MR BRAIN W WO CONTRAST  Result Date: 02/26/2021 CLINICAL DATA:  Brain/CNS neoplasm, assess treatment response; anaplastic astrocytoma thalamus EXAM: MRI HEAD WITHOUT AND WITH CONTRAST TECHNIQUE: Multiplanar, multiecho pulse sequences of the brain and surrounding structures were obtained without and with intravenous contrast. CONTRAST:  19m GADAVIST GADOBUTROL 1 MMOL/ML IV SOLN COMPARISON:  12/10/2020 FINDINGS: Brain: Heterogeneous mass is again identified centered within the right thalamic region extending into the corona radiata,  midbrain, and ependymal margin along the posterior third ventricle and cerebral aqueduct. Apart from some small changes in necrotic portions, no substantial change in size. There is similar surrounding abnormal T2 FLAIR hyperintensity in this region. No acute infarction.  Ventricles are stable in size. Vascular: Major vessel flow voids at the skull base are preserved. Skull and upper cervical spine: Normal marrow signal is preserved. Sinuses/Orbits: Paranasal sinuses are aerated. Orbits are unremarkable. Other: Sella is unremarkable.  Mastoid air cells are clear. IMPRESSION: No substantial change in enhancing lesion and surrounding T2 FLAIR hyperintensity since 12/10/2020. Electronically Signed   By: PMacy MisM.D.   On: 02/26/2021 08:39    Assessment and Plan: Anaplastic astrocytoma ([place] - Plan: MR BRAIN W WO CONTRAST  An[place] of thalamus ([place]  JTAJE LITTLERis clinically and radiographically stable today.  He has elected to defer further chemotherapy.  We will then recommend repeat MRI evaluation in 2 months.  Ok to increase Ritalin to 188mAM, 2011mM if tolerated.  Follow Up Instructions: We ask that JasELAZAR ARGABRIGHTturn to clinic in 2 months following next brain MRI, or sooner as needed.  I discussed the assessment and treatment plan with the patient.  The patient was provided an opportunity to ask questions and all were answered.  The patient agreed with the plan and demonstrated understanding of the instructions.    The patient was advised to call back or seek an in-person evaluation if the symptoms worsen or if the condition fails to improve as anticipated.  I provided 5-10 minutes of non-face-to-face time during this enocunter.  ZacVentura SellersD   I provided 22 minutes of non face-to-face telephone visit time during this encounter, and > 50% was spent counseling as  documented under my assessment & plan.

## 2021-03-06 ENCOUNTER — Other Ambulatory Visit: Payer: Self-pay | Admitting: Radiation Therapy

## 2021-03-09 ENCOUNTER — Other Ambulatory Visit: Payer: Self-pay | Admitting: Internal Medicine

## 2021-04-11 ENCOUNTER — Telehealth: Payer: Self-pay

## 2021-04-11 NOTE — Telephone Encounter (Signed)
Spoke with pt's wife. She will call [facility] today to get MRI scheduled. She know's to call back if she needs assistance. ?

## 2021-04-20 ENCOUNTER — Ambulatory Visit (HOSPITAL_COMMUNITY)
Admission: RE | Admit: 2021-04-20 | Discharge: 2021-04-20 | Disposition: A | Discharge status: Home / Self Care | Payer: BC Managed Care – PPO | Source: Ambulatory Visit | Attending: Internal Medicine | Admitting: Internal Medicine

## 2021-04-20 ENCOUNTER — Ambulatory Visit (HOSPITAL_COMMUNITY): Payer: BC Managed Care – PPO

## 2021-04-20 DIAGNOSIS — C719 Malignant neoplasm of brain, unspecified: Secondary | ICD-10-CM | POA: Diagnosis not present

## 2021-04-20 DIAGNOSIS — Z982 Presence of cerebrospinal fluid drainage device: Secondary | ICD-10-CM | POA: Diagnosis not present

## 2021-04-20 IMAGING — MR MR HEAD WO/W CM
15 series · 48 of 48 positions shown · IV contrast (gadavist)
Comparison: Brain MRI [DATE] and earlier.

CLINICAL DATA: 46-year-old male 44-year-old male with right
thalamic anaplastic astrocytoma. Status post biopsy in [6F], and
chemo radiation. Restaging.

EXAM:
MRI HEAD WITHOUT AND WITH CONTRAST
TECHNIQUE: Multiplanar, multiecho pulse sequences of the brain and surrounding
structures were obtained without and with intravenous contrast.
CONTRAST:  7.5mL GADAVIST GADOBUTROL 1 MMOL/ML IV SOLN

[Series 5: DWI · axial · 3.0mm · 1.36mm/px · z∈[+21,+156]mm · 5 of 96 slices shown (1 of 2)]
[im 1/96]
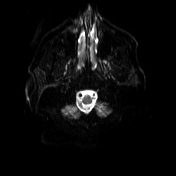
[im 24/96]
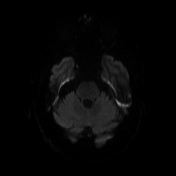
[im 48/96]
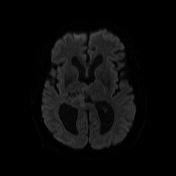
[im 72/96]
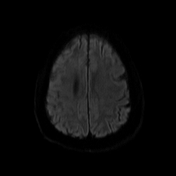
[im 96/96]
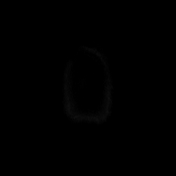

[Series 6: DWI · axial · 3.0mm · 1.36mm/px · z∈[+21,+156]mm · 3 of 48 slices shown (2 of 2)]
[im 1/48]
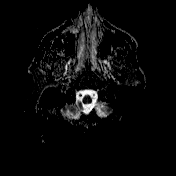
[im 24/48]
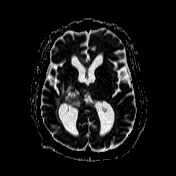
[im 48/48]
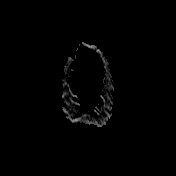

[Series 7: T1 · sagittal · 5.0mm · 0.75mm/px · 1 of 24 slices shown (1 of 4)]
[im 1/24]
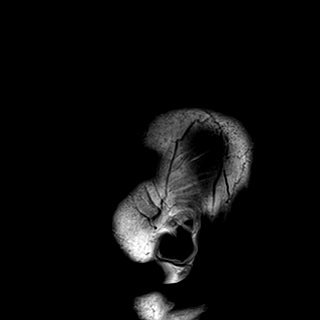

[Series 8: T2 · axial · 5.0mm · 0.62mm/px · 1 of 26 slices shown]
[im 1/26]
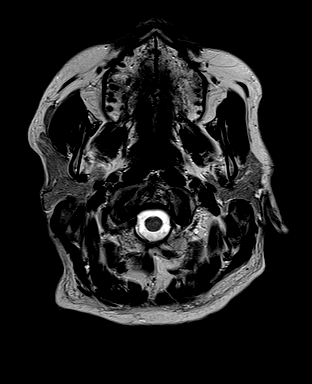

[Series 9: swi_images · axial · 3.0mm · 0.75mm/px · z∈[+6,+165]mm · 3 of 56 slices shown]
[im 1/56]
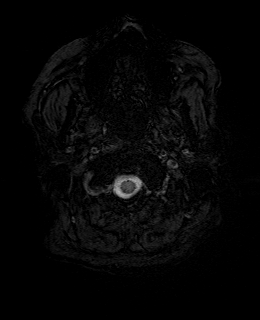
[im 28/56]
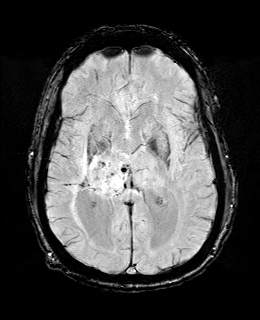
[im 56/56]
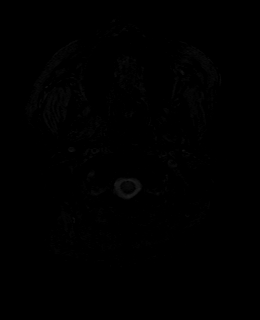

[Series 11: FLAIR · axial · 3.0mm · 0.75mm/px · z∈[+11,+159]mm · 3 of 52 slices shown]
[im 1/52]
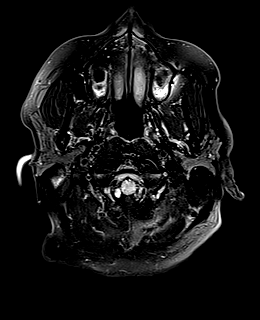
[im 26/52]
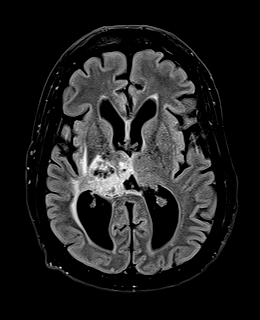
[im 52/52]
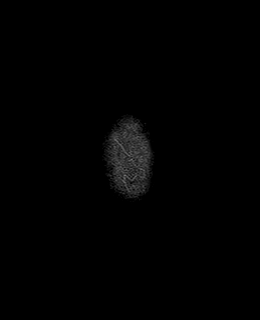

[Series 12: T1 · axial · 1.0mm · 0.94mm/px · z∈[+9,+162]mm · 9 of 160 slices shown (2 of 4)]
[im 1/160]
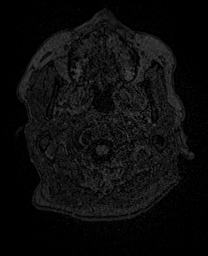
[im 20/160]
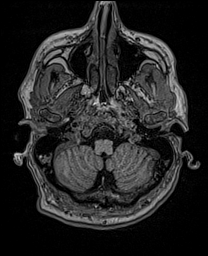
[im 40/160]
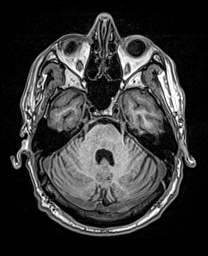
[im 60/160]
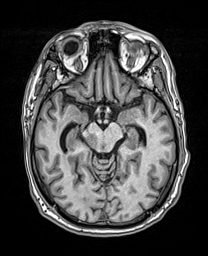
[im 80/160]
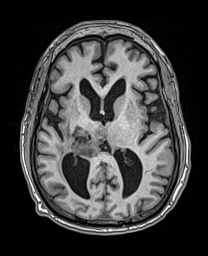
[im 100/160]
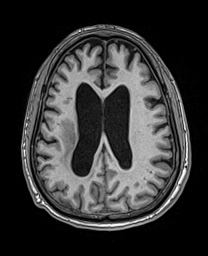
[im 120/160]
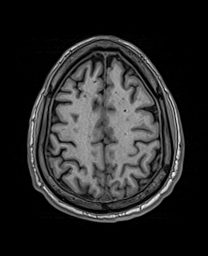
[im 140/160]
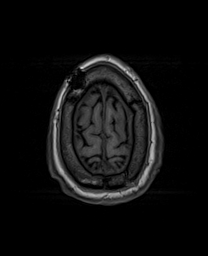
[im 160/160]
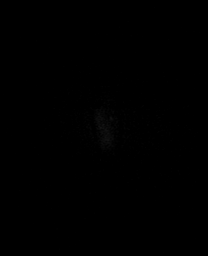

[Series 13: cor dwi_tracew · coronal · 5.0mm · 1.53mm/px · 3 of 56 slices shown]
[im 1/56]
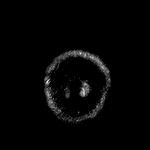
[im 28/56]
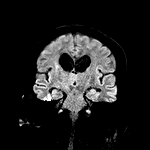
[im 56/56]
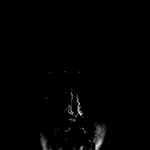

[Series 14: cor dwi_adc · coronal · 5.0mm · 1.53mm/px · 2 of 28 slices shown]
[im 1/28]
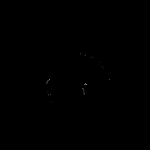
[im 28/28]
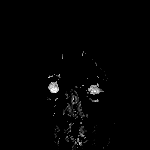

[Series 15: T2 post-contrast · coronal · 5.0mm · 0.57mm/px · 2 of 28 slices shown]
[im 1/28]
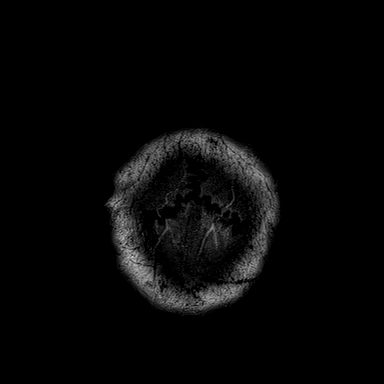
[im 28/28]
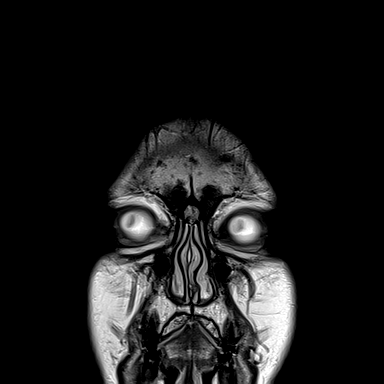

[Series 16: T1 post-contrast · axial · 1.0mm · 0.94mm/px · z∈[+9,+162]mm · 9 of 160 slices shown (1 of 3)]
[im 1/160]
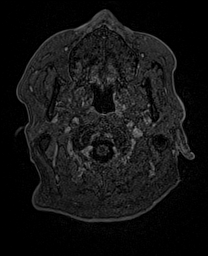
[im 20/160]
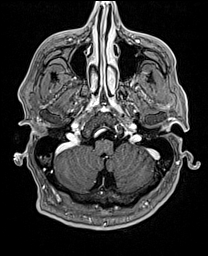
[im 40/160]
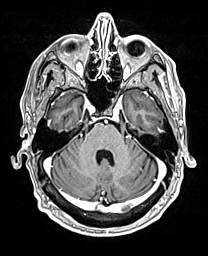
[im 60/160]
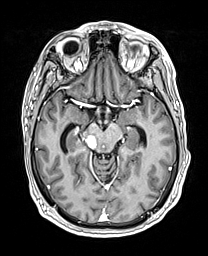
[im 80/160]
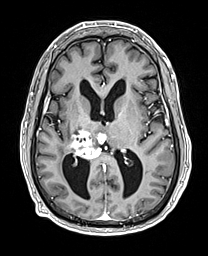
[im 100/160]
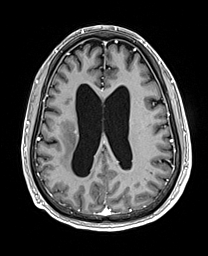
[im 120/160]
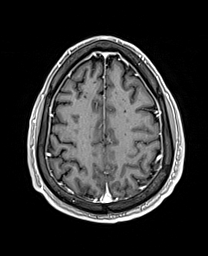
[im 140/160]
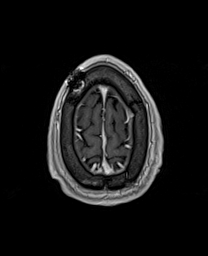
[im 160/160]
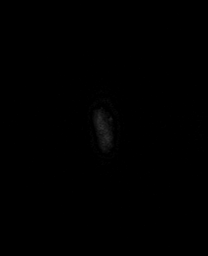

[Series 17: T1 · sagittal · 4.0mm · 0.94mm/px · 2 of 36 slices shown (3 of 4)]
[im 1/36]
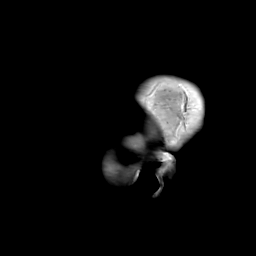
[im 36/36]
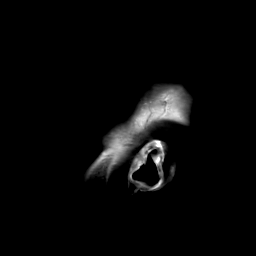

[Series 18: T1 · coronal · 4.0mm · 0.94mm/px · 2 of 45 slices shown (4 of 4)]
[im 1/45]
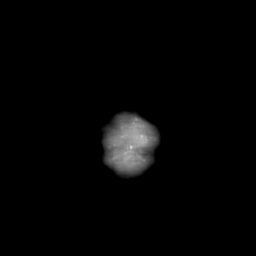
[im 45/45]
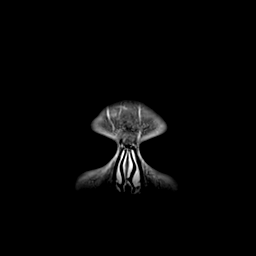

[Series 19: T1 post-contrast · coronal · 5.0mm · 0.43mm/px · 2 of 28 slices shown (2 of 3)]
[im 1/28]
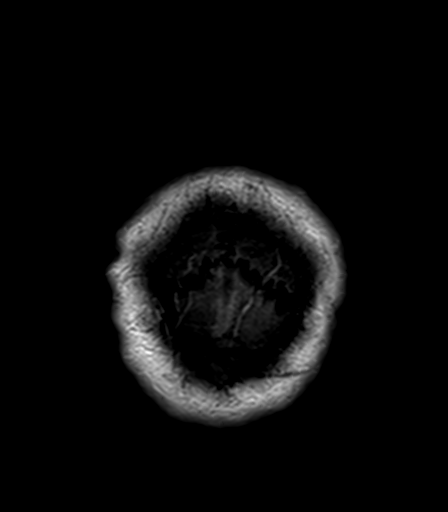
[im 28/28]
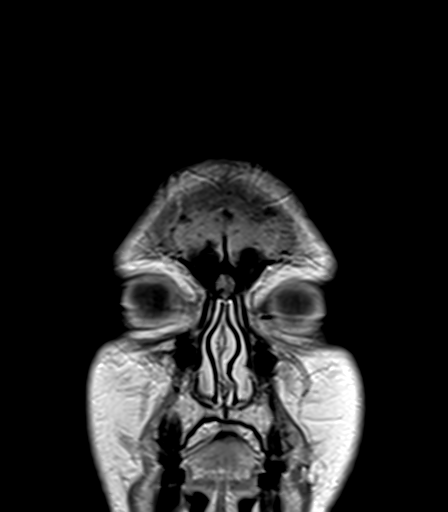

[Series 20: T1 post-contrast · sagittal · 5.0mm · 0.75mm/px · 1 of 24 slices shown (3 of 3)]
[im 1/24]
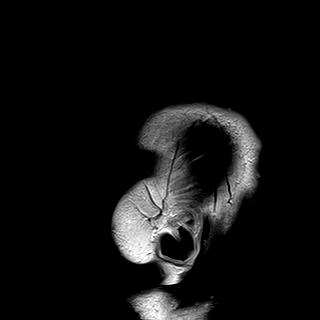

[48 of 48 positions shown; findings below may reference images not displayed]

FINDINGS: Brain: Chronic T2/FLAIR heterogeneous and enhancing mass centered at
the dorsal right thalamus tracking caudal into the right midbrain,
and cephalad into the posterior right corona radiata. There is a
combination of cystic and solid enhancing tumor. Since [DATE],
nodular enhancement in the right corona radiata has regressed
(series 19, image 15 today versus series 19, image 17 at that time).
Confluent enhancement in the thalamus extending to the midline,
inseparable from the 3rd ventricle, and into the lateral right mid
brain is stable and size and configuration (series 19, images 16 and
17 today versus series 19, image [REDACTED]). Abnormal
enhancement within the superior cerebral aqueduct, junction with the
3rd ventricle, persists, but is chronic and as before without
disseminated subependymal ventricular enhancement. Ventricle size
and configuration has not significantly changed over the past few
years. And compared to [DATE], overall tumor bulk and regional
mass effect have mildly regressed. Hemosiderin within the tumor has
increased since [6F].

Evidence of Wallerian degeneration in the right brainstem is
chronic. Additional chronic nonspecific cerebral white matter T2 and
FLAIR hyperintense foci bilaterally are stable. Sequelae of right
superior frontal ventriculostomy again noted.

No new intracranial enhancement identified. No superimposed
restricted diffusion to suggest acute infarction. No midline shift
or acute intracranial hemorrhage. Cervicomedullary junction and
pituitary are within normal limits.

Vascular: Major intracranial vascular flow voids are stable. The
major dural venous sinuses are enhancing and appear to be patent.

Skull and upper cervical spine: Negative visible cervical spine and
spinal cord. Visualized bone marrow signal is within normal limits.

Sinuses/Orbits: Rightward gaze today. Paranasal Visualized paranasal
sinuses and mastoids are stable and well aerated.

Other: Visible internal auditory structures appear normal. Stable
scalp and face.
IMPRESSION: 1. Stable infiltrative tumor centered at the posterior right
thalamus since [DATE]. Mildly regressed enhancement at the right
corona radiata since last year. Enhancement chronically extends to
the posterior 3rd ventricle, junction with the cerebral aqueduct,
but there is no evidence of CSF dissemination in the brain. Stable
ventricle size. The

2. No new intracranial abnormality.

## 2021-04-20 MED ORDER — GADOBUTROL 1 MMOL/ML IV SOLN
7.5000 mL | Freq: Once | INTRAVENOUS | Status: AC | PRN
  Administered 2021-04-20: 7.5 mL via INTRAVENOUS

## 2021-04-23 ENCOUNTER — Inpatient Hospital Stay: Payer: BC Managed Care – PPO | Attending: Internal Medicine

## 2021-04-23 DIAGNOSIS — Z923 Personal history of irradiation: Secondary | ICD-10-CM | POA: Insufficient documentation

## 2021-04-23 DIAGNOSIS — Z79899 Other long term (current) drug therapy: Secondary | ICD-10-CM | POA: Insufficient documentation

## 2021-04-23 DIAGNOSIS — R5383 Other fatigue: Secondary | ICD-10-CM | POA: Insufficient documentation

## 2021-04-23 DIAGNOSIS — Z87891 Personal history of nicotine dependence: Secondary | ICD-10-CM | POA: Insufficient documentation

## 2021-04-23 DIAGNOSIS — Z791 Long term (current) use of non-steroidal anti-inflammatories (NSAID): Secondary | ICD-10-CM | POA: Insufficient documentation

## 2021-04-23 DIAGNOSIS — N400 Enlarged prostate without lower urinary tract symptoms: Secondary | ICD-10-CM | POA: Insufficient documentation

## 2021-04-23 DIAGNOSIS — Z79630 Long term (current) use of alkylating agent: Secondary | ICD-10-CM | POA: Insufficient documentation

## 2021-04-23 DIAGNOSIS — C710 Malignant neoplasm of cerebrum, except lobes and ventricles: Secondary | ICD-10-CM | POA: Insufficient documentation

## 2021-04-23 DIAGNOSIS — Z9221 Personal history of antineoplastic chemotherapy: Secondary | ICD-10-CM | POA: Insufficient documentation

## 2021-04-24 ENCOUNTER — Inpatient Hospital Stay: Payer: BC Managed Care – PPO | Admitting: Internal Medicine

## 2021-04-24 ENCOUNTER — Other Ambulatory Visit: Payer: Self-pay

## 2021-04-24 VITALS — BP 115/65 | HR 57 | Temp 97.5°F | Resp 17 | Wt 182.6 lb

## 2021-04-24 DIAGNOSIS — Z923 Personal history of irradiation: Secondary | ICD-10-CM | POA: Diagnosis not present

## 2021-04-24 DIAGNOSIS — C710 Malignant neoplasm of cerebrum, except lobes and ventricles: Secondary | ICD-10-CM

## 2021-04-24 DIAGNOSIS — Z79630 Long term (current) use of alkylating agent: Secondary | ICD-10-CM | POA: Diagnosis not present

## 2021-04-24 DIAGNOSIS — Z79899 Other long term (current) drug therapy: Secondary | ICD-10-CM | POA: Diagnosis not present

## 2021-04-24 DIAGNOSIS — R5383 Other fatigue: Secondary | ICD-10-CM | POA: Diagnosis not present

## 2021-04-24 DIAGNOSIS — Z87891 Personal history of nicotine dependence: Secondary | ICD-10-CM | POA: Diagnosis not present

## 2021-04-24 DIAGNOSIS — Z791 Long term (current) use of non-steroidal anti-inflammatories (NSAID): Secondary | ICD-10-CM | POA: Diagnosis not present

## 2021-04-24 DIAGNOSIS — Z9221 Personal history of antineoplastic chemotherapy: Secondary | ICD-10-CM | POA: Diagnosis not present

## 2021-04-24 DIAGNOSIS — N400 Enlarged prostate without lower urinary tract symptoms: Secondary | ICD-10-CM | POA: Diagnosis not present

## 2021-04-24 MED ORDER — HYDROCORTISONE 10 MG PO TABS: 10.0000 mg | ORAL_TABLET | Freq: Two times a day (BID) | ORAL | 2 refills | Status: DC

## 2021-04-24 MED ORDER — METHYLPHENIDATE HCL 20 MG PO TABS: 20.0000 mg | ORAL_TABLET | Freq: Two times a day (BID) | ORAL | 0 refills | Status: DC

## 2021-04-24 NOTE — Progress Notes (Signed)
? ?[facility] [address]  ?[place], [place] [address] ?[phone] ? ? ?Interval Evaluation ? ?Date of Service: 04/24/21 ?Patient Name: Aaron Espinoza ?Patient MRN: [id number] ?Patient DOB: 1975/02/13 ?Provider: Ventura Sellers, MD ? ?Identifying Statement:  ?Aaron Espinoza is a 46 y.o. male with  right thalamic  anaplastic astrocytoma  ? ?Oncologic History: ?Oncology History  ?Anaplastic astrocytoma of thalamus ([place])  ?03/17/2019 Surgery  ? Stereotactic biopsy by Dr. Marcello Moores; path demonstrates anaplastic astrocytoma ?  ?04/12/2019 - 05/21/2019 Radiation Therapy  ? IMRT with concurrent Temodar 57m/m2 daily ?  ?06/14/2019 -  Chemotherapy  ? Patient is on Treatment Plan : BRAIN ANAPLASTIC GLIOMA GRADE III Temozolomide Post XRT q28d  ?   ? ? ?Biomarkers: ? ?MGMT Unknown.  ?IDH 1/2 Wild type.  ?EGFR Unknown  ?TERT Unknown  ? ?Interval History: ? Aaron LIGMANpresents to clinic after recent MRI brain.  It has now been 3 months since he completed 4th round of second "round" of 5-day Temodar.  Fatigue is stable from prior, taking Ritalin 10/20.  Left sided weakness remains dense.  He continues to rely full time on a four pointed walker, no longer able to use the cane for ambulation.   Shoulder pain is improved with sling and tramodol.   ? ?H+P (03/30/19) Patient presented to medical attention in late January, when he noticed mild confusion, gait imbalance.  These symptoms worsened over the next ~10 days, and were accompanied by incontinence.  Brain MRI was performed and demonstrated a right thalamic mass consistent with likely primary brain tumor.  He underwent stereotactic biopsy and ventriculostomy on 03/17/19 with Dr. TMarcello Moores  Following surgery he describes considerable improvement in most of his symptoms; at this time he is near baseline function but "very fatigued" overall.  No seizures or headaches since surgery. ? ?Medications: ?Current Outpatient Medications on File Prior to  Visit  ?Medication Sig Dispense Refill  ? acetaminophen (TYLENOL) 500 MG tablet Take 1,000 mg by mouth every 6 (six) hours as needed.    ? cyclobenzaprine (FLEXERIL) 5 MG tablet Take 1 tablet (5 mg total) by mouth 3 (three) times daily as needed for muscle spasms. 60 tablet 0  ? docusate sodium (COLACE) 100 MG capsule Take 100 mg by mouth daily.    ? gabapentin (NEURONTIN) 300 MG capsule Take 1 capsule (300 mg total) by mouth 3 (three) times daily. 90 capsule 3  ? hydrocortisone (CORTEF) 10 MG tablet Take 1 tablet (10 mg total) by mouth 2 (two) times daily. (Patient not taking: Reported on 01/30/2021) 60 tablet 2  ? ibuprofen (ADVIL) 200 MG tablet Take 800 mg by mouth every 6 (six) hours as needed.    ? lisinopril (ZESTRIL) 10 MG tablet Take 0.5 tablets (5 mg total) by mouth daily. 90 tablet 1  ? methylphenidate (RITALIN) 20 MG tablet Take 1 tablet (20 mg total) by mouth 2 (two) times daily with breakfast and lunch. 60 tablet 0  ? NON FORMULARY Take 2 capsules by mouth daily. [employer] CORDYCEPS for Energy Support    ? NON FORMULARY Take 2 capsules by mouth daily. [employer] BRAIN for Mental Clarity    ? NON FORMULARY Take 2 capsules by mouth daily. [employer] STAMETS 7 for Daily Immune Support    ? NON FORMULARY Take 2 capsules by mouth daily. [employer] [place]IL for Immune Support    ? ondansetron (ZOFRAN) 8 MG tablet Take 1 tablet (8  mg total) by mouth 2 (two) times daily as needed (nausea and vomiting). May take 30-60 minutes prior to Temodar administration if nausea/vomiting occurs. 30 tablet 1  ? pantoprazole (PROTONIX) 40 MG tablet TAKE 1 TABLET BY MOUTH EVERY DAY 90 tablet 1  ? polyethylene glycol (MIRALAX / GLYCOLAX) 17 g packet Take 1 packet by mouth daily.    ? STOOL SOFTENER 100 MG capsule TAKE 1 TABLET BY MOUTH 2 TIMES EVERY DAY AT BEDTIME AS NEEDED 60 tablet 2  ? tamsulosin (FLOMAX) 0.4 MG CAPS capsule TAKE 1 CAPSULE (0.4 MG TOTAL) BY MOUTH DAILY AFTER SUPPER. 90 capsule 1  ? temozolomide  (TEMODAR) 100 MG capsule TAKE 4 CAPSULES BY MOUTH 1 TIME A DAY AT BEDTIME FOR 5 DAYS OF 28 DAY CYCLE. (TOTAL DAILY DOSE OF 400MG) (Patient not taking: Reported on 01/30/2021) 20 capsule 0  ? temozolomide (TEMODAR) 100 MG capsule Take 4 capsules (400 mg total) by mouth daily. (Patient not taking: Reported on 01/30/2021) 20 capsule 0  ? temozolomide (TEMODAR) 100 MG capsule Take 4 capsules (400 mg total) by mouth daily. (Patient not taking: Reported on 01/30/2021) 20 capsule 0  ? temozolomide (TEMODAR) 100 MG capsule Take 4 capsules (400 mg total) by mouth daily. 20 capsule 0  ? traMADol (ULTRAM) 50 MG tablet Take 1 tablet (50 mg total) by mouth every 6 (six) hours as needed. (Patient not taking: Reported on 01/30/2021) 60 tablet 1  ? ?No current facility-administered medications on file prior to visit.  ? ? ?Allergies:  ?Allergies  ?Allergen Reactions  ? Gadolinium Derivatives Itching  ? ?Past Medical History:  ?Past Medical History:  ?Diagnosis Date  ? BPH (benign prostatic hyperplasia) 07/20/2015  ? Brain mass   ? Cognitive dysfunction 01/2019  ? Fatigue 01/2019  ? Headache   ? NF (neurofibromatosis) ([facility]) 07/20/2015  ? NF1  ? ?Past Surgical History:  ?Past Surgical History:  ?Procedure Laterality Date  ? HAND SURGERY Left   ? pinky - tumor benign  ? LEG SURGERY Left   ? inner thigh tumor - benign  ? VENTRICULOSTOMY N/A 03/17/2019  ? Procedure: ENDOSCOPIC THIRD VENTRICULOSTOMY;  Surgeon: Vallarie Mare, MD;  Location: [facility];  Service: Neurosurgery;  Laterality: N/A;  ENDOSCOPIC THIRD VENTRICULOSTOMY  ? ?Social History:  ?Social History  ? ?Socioeconomic History  ? Marital status: Married  ?  Spouse name: Not on file  ? Number of children: Not on file  ? Years of education: Not on file  ? Highest education level: Not on file  ?Occupational History  ? Not on file  ?Tobacco Use  ? Smoking status: Former  ?  Types: Cigars, Cigarettes  ? Smokeless tobacco: Never  ? Tobacco comments:  ?  Quit smoking cigarettes 25 yrs  ago, quit cigars in 2020  ?Vaping Use  ? Vaping Use: Never used  ?Substance and Sexual Activity  ? Alcohol use: Yes  ?  Alcohol/week: 6.0 - 9.0 standard drinks  ?  Types: 6 - 9 Standard drinks or equivalent per week  ? Drug use: No  ? Sexual activity: Not on file  ?Other Topics Concern  ? Not on file  ?Social History Narrative  ? Not on file  ? ?Social Determinants of Health  ? ?Financial Resource Strain: Not on file  ?Food Insecurity: Not on file  ?Transportation Needs: Not on file  ?Physical Activity: Not on file  ?Stress: Not on file  ?Social Connections: Not on file  ?Intimate Partner Violence: Not on file  ? ?  Family History:  ?Family History  ?Problem Relation Age of Onset  ? Cancer Paternal Grandfather   ?     Prostate  ? ? ?Review of Systems: ?Constitutional: swelling in face ?Eyes: Doesn't report blurriness of vision ?Ears, nose, mouth, throat, and face: Doesn't report sore throat ?Respiratory: Doesn't report cough, dyspnea or wheezes ?Cardiovascular: Doesn't report palpitation, chest discomfort  ?Gastrointestinal:  Doesn't report nausea, constipation, diarrhea ?GU: difficulty initiating stream, nocturia ?Skin: Doesn't report skin rashes ?Neurological: Per HPI ?Musculoskeletal: Doesn't report joint pain ?Behavioral/Psych: Doesn't report anxiety ? ?Physical Exam: ?Vitals:  ? 04/24/21 1220  ?BP: 115/65  ?Pulse: (!) 57  ?Resp: 17  ?Temp: (!) 97.5 ?F (36.4 ?C)  ?SpO2: 100%  ? ?KPS: 60. ?General: normal ?Head: Normal ?EENT: No conjunctival injection or scleral icterus.  ?Lungs: Resp effort normal ?Cardiac: Regular rate ?Abdomen: Non-distended abdomen ?Skin: No rashes cyanosis or petechiae. ?Extremities: No clubbing or edema ? ?Neurologic Exam: ?Mental Status: Awake, alert, attentive to examiner. Oriented to self and environment. Language is fluent with intact comprehension.  ?Cranial Nerves: Visual acuity is grossly normal. L visual field impairment. Extra-ocular movements intact. No ptosis. Face is  symmetric ?Motor: Tone and bulk are normal. Power is 1/5 in left arm and 3/5 in left leg. Reflexes are symmetric, no pathologic reflexes present.  ?Sensory: Intact to light touch ?Gait: Hemiparetic, not independen

## 2021-04-25 ENCOUNTER — Telehealth: Payer: Self-pay | Admitting: Internal Medicine

## 2021-04-25 NOTE — Telephone Encounter (Signed)
Scheduled per 3/21 los, message has been left with pt ?

## 2021-05-26 ENCOUNTER — Other Ambulatory Visit: Payer: Self-pay | Admitting: Internal Medicine

## 2021-05-28 ENCOUNTER — Encounter: Payer: Self-pay | Admitting: Internal Medicine

## 2021-07-17 ENCOUNTER — Other Ambulatory Visit: Payer: Self-pay | Admitting: Radiation Therapy

## 2021-07-19 ENCOUNTER — Ambulatory Visit (HOSPITAL_COMMUNITY)
Admission: RE | Admit: 2021-07-19 | Discharge: 2021-07-19 | Disposition: A | Discharge status: Home / Self Care | Payer: BC Managed Care – PPO | Source: Ambulatory Visit | Attending: Internal Medicine | Admitting: Internal Medicine

## 2021-07-19 DIAGNOSIS — C710 Malignant neoplasm of cerebrum, except lobes and ventricles: Secondary | ICD-10-CM | POA: Diagnosis not present

## 2021-07-19 DIAGNOSIS — I619 Nontraumatic intracerebral hemorrhage, unspecified: Secondary | ICD-10-CM | POA: Diagnosis not present

## 2021-07-19 DIAGNOSIS — R220 Localized swelling, mass and lump, head: Secondary | ICD-10-CM | POA: Diagnosis not present

## 2021-07-19 DIAGNOSIS — C719 Malignant neoplasm of brain, unspecified: Secondary | ICD-10-CM | POA: Diagnosis not present

## 2021-07-19 DIAGNOSIS — D496 Neoplasm of unspecified behavior of brain: Secondary | ICD-10-CM | POA: Diagnosis not present

## 2021-07-19 IMAGING — MR MR HEAD WO/W CM
15 series · 48 of 48 positions shown · IV contrast (gadavist)
Comparison: [DATE]

CLINICAL DATA: Anaplastic astrocytoma of the thalamus. Chemo and
radiation therapy.

EXAM:
MRI HEAD WITHOUT AND WITH CONTRAST
TECHNIQUE: Multiplanar, multiecho pulse sequences of the brain and surrounding
structures were obtained without and with intravenous contrast.
CONTRAST:  8mL GADAVIST GADOBUTROL 1 MMOL/ML IV SOLN

[Series 5: DWI · axial · 3.0mm · 1.36mm/px · z∈[-69,+74]mm · 7 of 100 slices shown (1 of 2)]
[im 1/100]
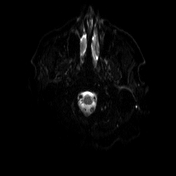
[im 17/100]
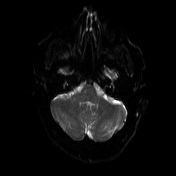
[im 34/100]
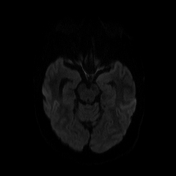
[im 50/100]
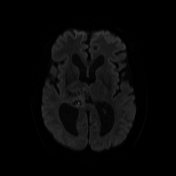
[im 67/100]
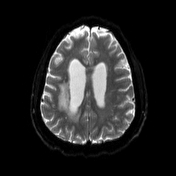
[im 83/100]
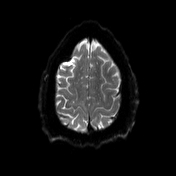
[im 100/100]
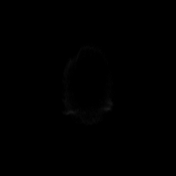

[Series 6: DWI · axial · 3.0mm · 1.36mm/px · z∈[-69,+74]mm · 3 of 50 slices shown (2 of 2)]
[im 1/50]
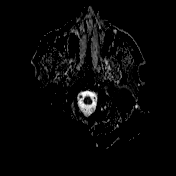
[im 25/50]
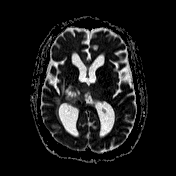
[im 50/50]
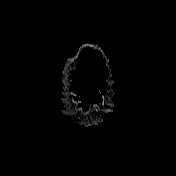

[Series 7: T1 · sagittal · 5.0mm · 0.75mm/px · 1 of 24 slices shown (1 of 4)]
[im 1/24]
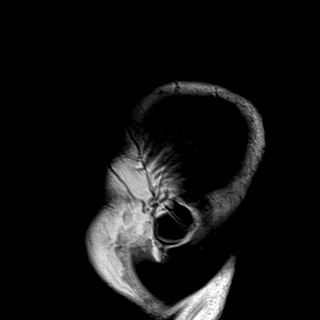

[Series 8: T2 · axial · 5.0mm · 0.62mm/px · 1 of 26 slices shown (1 of 2)]
[im 1/26]
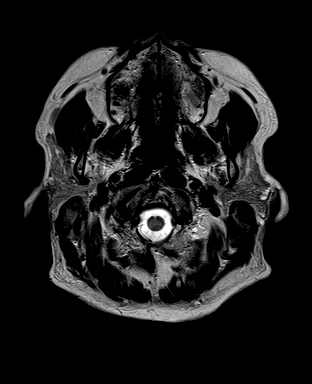

[Series 9: swi_images · axial · 3.0mm · 0.75mm/px · z∈[-80,+93]mm · 3 of 60 slices shown]
[im 1/60]
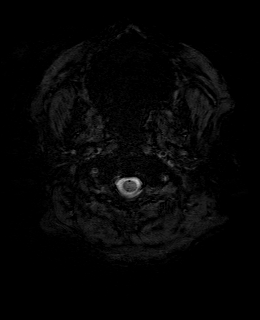
[im 30/60]
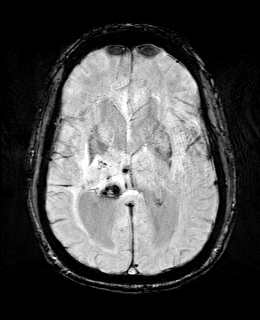
[im 60/60]
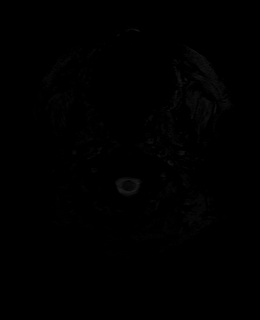

[Series 11: FLAIR · axial · 3.0mm · 0.75mm/px · z∈[-68,+81]mm · 3 of 52 slices shown]
[im 1/52]
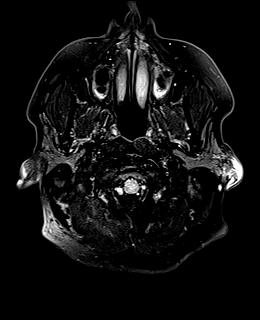
[im 26/52]
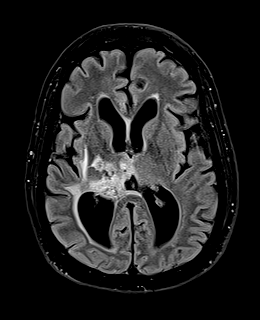
[im 52/52]
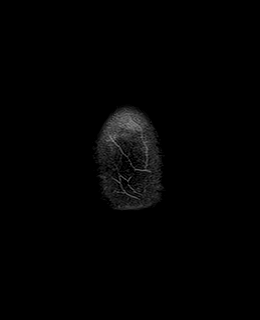

[Series 12: T1 · axial · 1.1mm · 0.94mm/px · z∈[-68,+85]mm · 8 of 144 slices shown (2 of 4)]
[im 1/144]
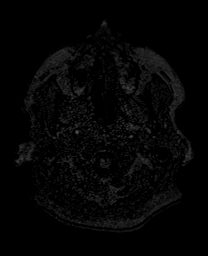
[im 21/144]
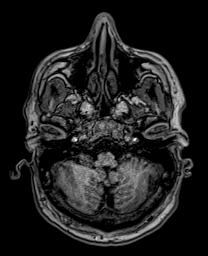
[im 41/144]
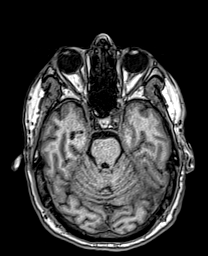
[im 62/144]
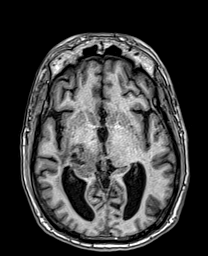
[im 82/144]
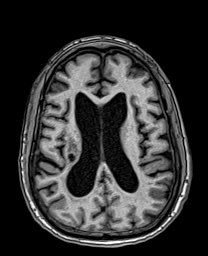
[im 103/144]
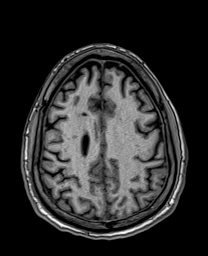
[im 123/144]
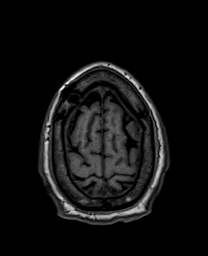
[im 144/144]
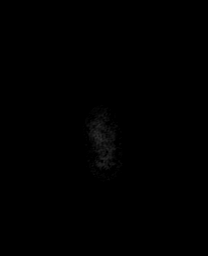

[Series 13: cor dwi_tracew · coronal · 5.0mm · 1.53mm/px · 3 of 58 slices shown]
[im 1/58]
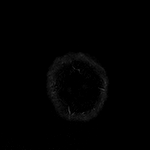
[im 29/58]
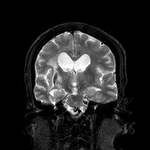
[im 58/58]
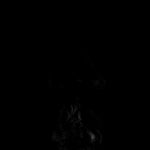

[Series 14: cor dwi_adc · coronal · 5.0mm · 1.53mm/px · 2 of 29 slices shown]
[im 1/29]
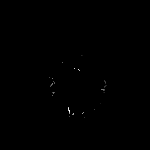
[im 29/29]
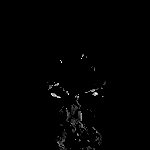

[Series 15: T2 · coronal · 5.0mm · 0.86mm/px · 2 of 29 slices shown (2 of 2)]
[im 1/29]
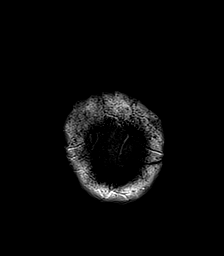
[im 29/29]
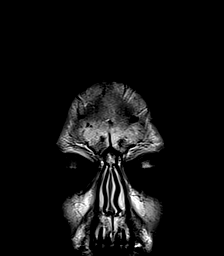

[Series 16: T1 post-contrast · axial · 1.1mm · 0.94mm/px · z∈[-68,+85]mm · 8 of 144 slices shown (1 of 3)]
[im 1/144]
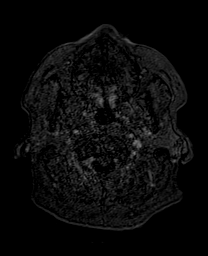
[im 21/144]
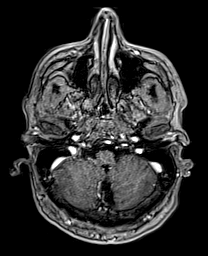
[im 41/144]
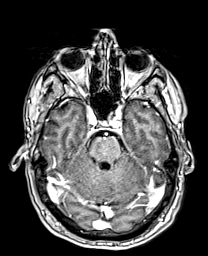
[im 62/144]
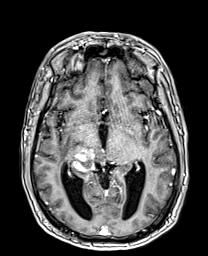
[im 82/144]
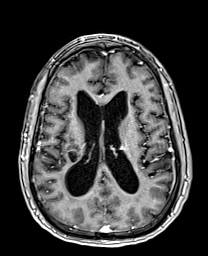
[im 103/144]
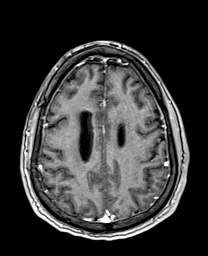
[im 123/144]
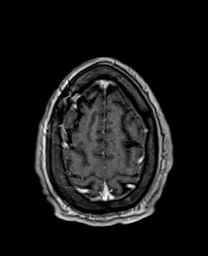
[im 144/144]
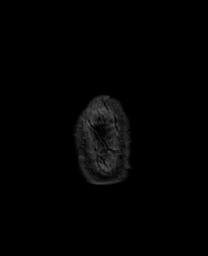

[Series 17: T1 · sagittal · 4.0mm · 0.94mm/px · 2 of 32 slices shown (3 of 4)]
[im 1/32]
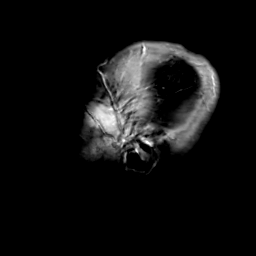
[im 32/32]
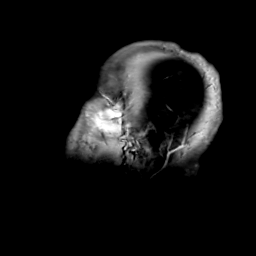

[Series 18: T1 · coronal · 4.0mm · 0.94mm/px · 2 of 42 slices shown (4 of 4)]
[im 1/42]
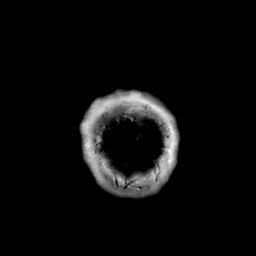
[im 42/42]
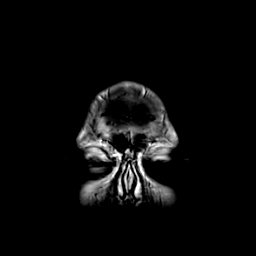

[Series 19: T1 post-contrast · coronal · 5.0mm · 0.43mm/px · 2 of 29 slices shown (2 of 3)]
[im 1/29]
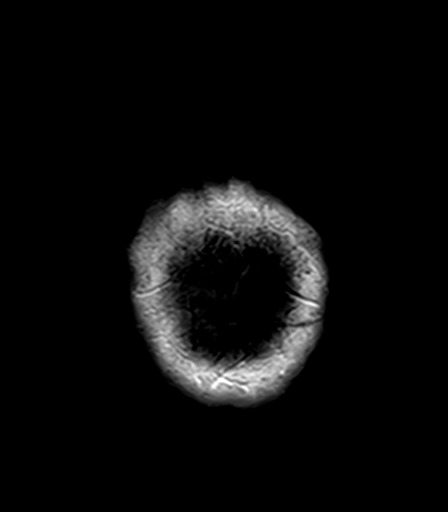
[im 29/29]
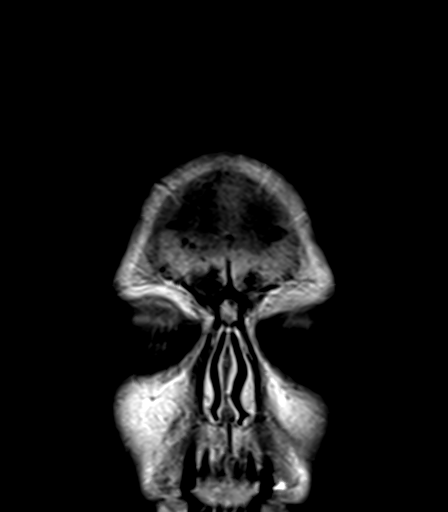

[Series 20: T1 post-contrast · sagittal · 5.0mm · 0.75mm/px · 1 of 24 slices shown (3 of 3)]
[im 1/24]
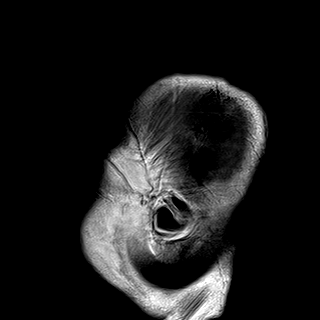

[48 of 48 positions shown; findings below may reference images not displayed]

FINDINGS: Brain: Infiltrative T2 hyperintense mass in the right thalamus and
midbrain with patchy nodular enhancement and cavities. There has
been some hemorrhage in the posterior aspect of the mass since
prior, best seen on gradient and on diffusion imaging, with
increased cystic space in this area measured on [DATE] at 1 cm. The
overall extent of enhancement is unchanged, including enhancement
lining the lower third ventricle and cerebral aqua duct. Unchanged
extent of T2 hyperintensity in the region of tumor. Stable
ventriculomegaly. No complicating infarct.

Vascular: Major flow voids and vascular enhancements are preserved

Skull and upper cervical spine: Right frontal burr hole.

Sinuses/Orbits: Negative
IMPRESSION: Small interval hemorrhage with nonacute blood products in the right
posterior thalamic portion of the mass. Otherwise stable with no
progression of enhancing tumor or T2 signal.

## 2021-07-19 MED ORDER — GADOBUTROL 1 MMOL/ML IV SOLN
8.0000 mL | Freq: Once | INTRAVENOUS | Status: AC | PRN
  Administered 2021-07-19: 8 mL via INTRAVENOUS

## 2021-07-23 ENCOUNTER — Inpatient Hospital Stay: Payer: BC Managed Care – PPO | Attending: Internal Medicine

## 2021-07-24 ENCOUNTER — Inpatient Hospital Stay (HOSPITAL_BASED_OUTPATIENT_CLINIC_OR_DEPARTMENT_OTHER): Payer: BC Managed Care – PPO | Admitting: Internal Medicine

## 2021-07-24 ENCOUNTER — Telehealth: Payer: Self-pay | Admitting: Internal Medicine

## 2021-07-24 ENCOUNTER — Inpatient Hospital Stay: Payer: BC Managed Care – PPO | Admitting: Internal Medicine

## 2021-07-24 DIAGNOSIS — C710 Malignant neoplasm of cerebrum, except lobes and ventricles: Secondary | ICD-10-CM

## 2021-07-24 MED ORDER — SERTRALINE HCL 25 MG PO TABS: 25.0000 mg | ORAL_TABLET | Freq: Every day | ORAL | 2 refills | Status: DC

## 2021-07-24 NOTE — Progress Notes (Signed)
I connected with Aaron Espinoza on 07/24/21 at 12:00 PM EDT by telephone visit and verified that I am speaking with the correct person using two identifiers.  I discussed the limitations, risks, security and privacy concerns of performing an evaluation and management service by telemedicine and the availability of in-person appointments. I also discussed with the patient that there may be a patient responsible charge related to this service. The patient expressed understanding and agreed to proceed.  Other persons participating in the visit and their role in the encounter:  spouse   Patient's location:  Home  Provider's location:  Office  Chief Complaint:  Anaplastic astrocytoma of thalamus ([place])  History of Present Ilness: Aaron Espinoza describes ongoing issues with fatigue, depression symptoms.  No change in chronic left sided weakness, functional impairment.  Planning trip to [place] this summer. Observations: Language and cognition at baseline  Imaging:  [facility] Interpretation: I have personally reviewed the CNS images as listed.  My interpretation, in the context of the patient's clinical presentation, is stable disease  MR BRAIN W WO CONTRAST  Result Date: 07/20/2021 CLINICAL DATA:  Anaplastic astrocytoma of the thalamus. Chemo and radiation therapy. EXAM: MRI HEAD WITHOUT AND WITH CONTRAST TECHNIQUE: Multiplanar, multiecho pulse sequences of the brain and surrounding structures were obtained without and with intravenous contrast. CONTRAST:  32m GADAVIST GADOBUTROL 1 MMOL/ML IV SOLN COMPARISON:  04/20/2021 FINDINGS: Brain: Infiltrative T2 hyperintense mass in the right thalamus and midbrain with patchy nodular enhancement and cavities. There has been some hemorrhage in the posterior aspect of the mass since prior, best seen on gradient and on diffusion imaging, with increased cystic space in this area measured on 16:71 at 1 cm. The overall extent of enhancement is unchanged,  including enhancement lining the lower third ventricle and cerebral aqua duct. Unchanged extent of T2 hyperintensity in the region of tumor. Stable ventriculomegaly. No complicating infarct. Vascular: Major flow voids and vascular enhancements are preserved Skull and upper cervical spine: Right frontal burr hole. Sinuses/Orbits: Negative IMPRESSION: Small interval hemorrhage with nonacute blood products in the right posterior thalamic portion of the mass. Otherwise stable with no progression of enhancing tumor or T2 signal. Electronically Signed   By: JJorje GuildM.D.   On: 07/20/2021 21:12    Assessment and Plan: Anaplastic astrocytoma of thalamus ([facility] - Plan: MR BRAIN W WO CONTRAST  JKAYVAN HOEFLINGis clinically stable today.  MRI brain demonstrates stable findings, some intratumoral blood products which are not acute.  We recommended trial of zoloft 222mdaily for depression symptoms.  He is agreeable with this.    Will con't ritalin 20/20 and hydrocortisone 10/10 as prior.  Follow Up Instructions: We ask that JaKEBIN MAYEeturn to clinic in 3 months following next brain MRI, or sooner as needed.  I discussed the assessment and treatment plan with the patient.  The patient was provided an opportunity to ask questions and all were answered.  The patient agreed with the plan and demonstrated understanding of the instructions.    The patient was advised to call back or seek an in-person evaluation if the symptoms worsen or if the condition fails to improve as anticipated.  I provided 5-10 minutes of non-face-to-face time during this enocunter.  ZaVentura SellersMD   I provided 25 minutes of non face-to-face telephone visit time during this encounter, and > 50% was spent counseling as documented under my assessment & plan.

## 2021-07-24 NOTE — Telephone Encounter (Signed)
Per 6/20 los called and left message for pt about appointment in september. Details and call back number were left

## 2021-08-18 ENCOUNTER — Other Ambulatory Visit: Payer: Self-pay | Admitting: Internal Medicine

## 2021-08-20 ENCOUNTER — Encounter: Payer: Self-pay | Admitting: Internal Medicine

## 2021-08-27 ENCOUNTER — Other Ambulatory Visit: Payer: Self-pay

## 2021-08-30 ENCOUNTER — Other Ambulatory Visit: Payer: Self-pay | Admitting: Internal Medicine

## 2021-08-30 MED ORDER — METHYLPHENIDATE HCL 20 MG PO TABS: 20.0000 mg | ORAL_TABLET | Freq: Two times a day (BID) | ORAL | 0 refills | Status: DC

## 2021-08-31 ENCOUNTER — Telehealth: Payer: Self-pay

## 2021-08-31 NOTE — Telephone Encounter (Signed)
Notified Patient of prior authorization approval for Methylphenidate '20mg'$  tablets. Medication is approved through 08/31/2022. No other needs or concerns voiced at this time.

## 2021-09-04 ENCOUNTER — Other Ambulatory Visit: Payer: Self-pay

## 2021-09-14 ENCOUNTER — Other Ambulatory Visit: Payer: Self-pay | Admitting: Internal Medicine

## 2021-09-17 ENCOUNTER — Encounter: Payer: Self-pay | Admitting: Internal Medicine

## 2021-09-24 ENCOUNTER — Other Ambulatory Visit: Payer: Self-pay | Admitting: Internal Medicine

## 2021-10-23 ENCOUNTER — Other Ambulatory Visit: Payer: Self-pay | Admitting: Radiation Therapy

## 2021-10-23 ENCOUNTER — Inpatient Hospital Stay: Payer: BC Managed Care – PPO | Admitting: Internal Medicine

## 2021-10-24 ENCOUNTER — Other Ambulatory Visit: Payer: Self-pay

## 2021-10-30 ENCOUNTER — Ambulatory Visit (HOSPITAL_COMMUNITY): Payer: BC Managed Care – PPO

## 2021-11-02 ENCOUNTER — Ambulatory Visit (HOSPITAL_COMMUNITY)
Admission: RE | Admit: 2021-11-02 | Discharge: 2021-11-02 | Disposition: A | Discharge status: Home / Self Care | Payer: BC Managed Care – PPO | Source: Ambulatory Visit | Attending: Internal Medicine | Admitting: Internal Medicine

## 2021-11-02 ENCOUNTER — Encounter: Payer: Self-pay | Admitting: Internal Medicine

## 2021-11-02 DIAGNOSIS — C710 Malignant neoplasm of cerebrum, except lobes and ventricles: Secondary | ICD-10-CM | POA: Diagnosis not present

## 2021-11-02 DIAGNOSIS — G930 Cerebral cysts: Secondary | ICD-10-CM | POA: Diagnosis not present

## 2021-11-02 DIAGNOSIS — R220 Localized swelling, mass and lump, head: Secondary | ICD-10-CM | POA: Diagnosis not present

## 2021-11-02 DIAGNOSIS — I619 Nontraumatic intracerebral hemorrhage, unspecified: Secondary | ICD-10-CM | POA: Diagnosis not present

## 2021-11-02 DIAGNOSIS — C719 Malignant neoplasm of brain, unspecified: Secondary | ICD-10-CM | POA: Diagnosis not present

## 2021-11-02 MED ORDER — GADOPICLENOL 0.5 MMOL/ML IV SOLN
8.0000 mL | Freq: Once | INTRAVENOUS | Status: AC | PRN
  Administered 2021-11-02: 8 mL via INTRAVENOUS

## 2021-11-05 ENCOUNTER — Inpatient Hospital Stay: Payer: BC Managed Care – PPO

## 2021-11-05 ENCOUNTER — Other Ambulatory Visit: Payer: Self-pay

## 2021-11-05 ENCOUNTER — Inpatient Hospital Stay: Payer: BC Managed Care – PPO | Attending: Internal Medicine | Admitting: Internal Medicine

## 2021-11-05 ENCOUNTER — Other Ambulatory Visit: Payer: Self-pay | Admitting: *Deleted

## 2021-11-05 VITALS — BP 139/66 | HR 69 | Temp 97.9°F | Resp 18 | Ht 72.0 in | Wt 186.3 lb

## 2021-11-05 DIAGNOSIS — N400 Enlarged prostate without lower urinary tract symptoms: Secondary | ICD-10-CM | POA: Insufficient documentation

## 2021-11-05 DIAGNOSIS — Z87891 Personal history of nicotine dependence: Secondary | ICD-10-CM | POA: Diagnosis not present

## 2021-11-05 DIAGNOSIS — G9389 Other specified disorders of brain: Secondary | ICD-10-CM | POA: Insufficient documentation

## 2021-11-05 DIAGNOSIS — Z8042 Family history of malignant neoplasm of prostate: Secondary | ICD-10-CM | POA: Diagnosis not present

## 2021-11-05 DIAGNOSIS — E559 Vitamin D deficiency, unspecified: Secondary | ICD-10-CM

## 2021-11-05 DIAGNOSIS — Z923 Personal history of irradiation: Secondary | ICD-10-CM | POA: Insufficient documentation

## 2021-11-05 DIAGNOSIS — D539 Nutritional anemia, unspecified: Secondary | ICD-10-CM

## 2021-11-05 DIAGNOSIS — Z79899 Other long term (current) drug therapy: Secondary | ICD-10-CM | POA: Diagnosis not present

## 2021-11-05 DIAGNOSIS — Z79630 Long term (current) use of alkylating agent: Secondary | ICD-10-CM | POA: Diagnosis not present

## 2021-11-05 DIAGNOSIS — C710 Malignant neoplasm of cerebrum, except lobes and ventricles: Secondary | ICD-10-CM

## 2021-11-05 DIAGNOSIS — R5383 Other fatigue: Secondary | ICD-10-CM | POA: Diagnosis not present

## 2021-11-05 DIAGNOSIS — Z9221 Personal history of antineoplastic chemotherapy: Secondary | ICD-10-CM | POA: Diagnosis not present

## 2021-11-05 LAB — IRON AND IRON BINDING CAPACITY (CC-WL,HP ONLY)
Iron: 164 ug/dL (ref 45–182)
Saturation Ratios: 55 % — ABNORMAL HIGH (ref 17.9–39.5)
TIBC: 301 ug/dL (ref 250–450)
UIBC: 137 ug/dL (ref 117–376)

## 2021-11-05 LAB — CORTISOL: Cortisol, Plasma: 13.8 ug/dL

## 2021-11-05 LAB — VITAMIN B12: Vitamin B-12: 289 pg/mL (ref 180–914)

## 2021-11-05 LAB — VITAMIN D 25 HYDROXY (VIT D DEFICIENCY, FRACTURES): Vit D, 25-Hydroxy: 45.52 ng/mL (ref 30–100)

## 2021-11-05 LAB — FERRITIN: Ferritin: 205 ng/mL (ref 24–336)

## 2021-11-05 LAB — TSH: TSH: 3.058 u[IU]/mL (ref 0.350–4.500)

## 2021-11-05 NOTE — Progress Notes (Signed)
[facility] [place], [place] [address] [phone]   Interval Evaluation  Date of Service: 11/05/21 Patient Name: Aaron Espinoza Patient MRN: [id number] Patient DOB: 14-Oct-1975 Provider: Ventura Sellers, MD  Identifying Statement:  Aaron Espinoza is a 46 y.o. male with  right thalamic  anaplastic astrocytoma   Oncologic History: Oncology History  Anaplastic astrocytoma of thalamus ([place])  03/17/2019 Surgery   Stereotactic biopsy by Dr. Marcello Moores; path demonstrates anaplastic astrocytoma   04/12/2019 - 05/21/2019 Radiation Therapy   IMRT with concurrent Temodar 6m/m2 daily   06/14/2019 - 06/19/2020 Chemotherapy   Completes 12 cycles of adjuvant 5-day Temodar   12/11/2020 Progression   Progression of disease #1   12/12/2020 - 04/24/2021 Chemotherapy   Completes 4 additional cycles of 5-day Temodar     Biomarkers:  MGMT Unknown.  IDH 1/2 Wild type.  EGFR Unknown  TERT Unknown   Interval History:  Aaron ERMISpresents to clinic after recent MRI brain. No significant new or progressive changes.  Fatigue is stable despite discontinuing Ritalin, hydrocortisone.  Left sided weakness remains dense as prior.  He continues to rely full time on a four pointed walker, no longer able to use the cane for ambulation.   No further severe headaches.  H+P (03/30/19) Patient presented to medical attention in late January, when he noticed mild confusion, gait imbalance.  These symptoms worsened over the next ~10 days, and were accompanied by incontinence.  Brain MRI was performed and demonstrated a right thalamic mass consistent with likely primary brain tumor.  He underwent stereotactic biopsy and ventriculostomy on 03/17/19 with Dr. TMarcello Moores  Following surgery he describes considerable improvement in most of his symptoms; at this time he is near baseline function but "very fatigued" overall.  No seizures or headaches since  surgery.  Medications: Current Outpatient Medications on File Prior to Visit  Medication Sig Dispense Refill   acetaminophen (TYLENOL) 500 MG tablet Take 1,000 mg by mouth every 6 (six) hours as needed.     cyclobenzaprine (FLEXERIL) 5 MG tablet Take 1 tablet (5 mg total) by mouth 3 (three) times daily as needed for muscle spasms. 60 tablet 0   docusate sodium (COLACE) 100 MG capsule Take 100 mg by mouth daily.     gabapentin (NEURONTIN) 300 MG capsule Take 1 capsule (300 mg total) by mouth 3 (three) times daily. 90 capsule 3   hydrocortisone (CORTEF) 10 MG tablet TAKE 1 TABLET BY MOUTH TWICE A DAY 60 tablet 2   lisinopril (ZESTRIL) 10 MG tablet TAKE 1 TABLET BY MOUTH EVERY DAY 90 tablet 1   methylphenidate (RITALIN) 20 MG tablet Take 1 tablet (20 mg total) by mouth 2 (two) times daily with breakfast and lunch. 180 tablet 0   NON FORMULARY Take 2 capsules by mouth daily. [employer] CORDYCEPS for Energy Support     NON FORMULARY Take 2 capsules by mouth daily. [employer] BRAIN for Mental Clarity     NON FORMULARY Take 2 capsules by mouth daily. [employer] STAMETS 7 for Daily Immune Support     NON FORMULARY Take 2 capsules by mouth daily. [employer] [place]TAIL for Immune Support     ondansetron (ZOFRAN) 8 MG tablet Take 1 tablet (8 mg total) by mouth 2 (two) times daily as needed (nausea and vomiting). May take 30-60 minutes prior to Temodar administration if nausea/vomiting occurs. 30 tablet 1   pantoprazole (PROTONIX) 40 MG tablet  TAKE 1 TABLET BY MOUTH EVERY DAY 90 tablet 1   polyethylene glycol (MIRALAX / GLYCOLAX) 17 g packet Take 1 packet by mouth daily.     sertraline (ZOLOFT) 25 MG tablet TAKE 1 TABLET (25 MG TOTAL) BY MOUTH DAILY. 90 tablet 1   STOOL SOFTENER 100 MG capsule TAKE 1 TABLET BY MOUTH 2 TIMES EVERY DAY AT BEDTIME AS NEEDED 60 tablet 2   tamsulosin (FLOMAX) 0.4 MG CAPS capsule TAKE 1 CAPSULE (0.4 MG TOTAL) BY MOUTH DAILY AFTER SUPPER. 90 capsule 1   temozolomide  (TEMODAR) 100 MG capsule TAKE 4 CAPSULES BY MOUTH 1 TIME A DAY AT BEDTIME FOR 5 DAYS OF 28 DAY CYCLE. (TOTAL DAILY DOSE OF 400MG) 20 capsule 0   temozolomide (TEMODAR) 100 MG capsule Take 4 capsules (400 mg total) by mouth daily. 20 capsule 0   temozolomide (TEMODAR) 100 MG capsule Take 4 capsules (400 mg total) by mouth daily. 20 capsule 0   temozolomide (TEMODAR) 100 MG capsule Take 4 capsules (400 mg total) by mouth daily. 20 capsule 0   traMADol (ULTRAM) 50 MG tablet Take 1 tablet (50 mg total) by mouth every 6 (six) hours as needed. 60 tablet 1   No current facility-administered medications on file prior to visit.    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   Past Surgical History:  Past Surgical History:  Procedure Laterality Date   HAND SURGERY Left    pinky - tumor benign   LEG SURGERY Left    inner thigh tumor - benign   VENTRICULOSTOMY N/A 03/17/2019   Procedure: ENDOSCOPIC THIRD VENTRICULOSTOMY;  Surgeon: Vallarie Mare, MD;  Location: [facility];  Service: Neurosurgery;  Laterality: N/A;  ENDOSCOPIC THIRD VENTRICULOSTOMY   Social History:  Social History   Socioeconomic History   Marital status: Married    Spouse name: Not on file   Number of children: Not on file   Years of education: Not on file   Highest education level: Not on file  Occupational History   Not on file  Tobacco Use   Smoking status: Former    Types: Cigars, Cigarettes   Smokeless tobacco: Never   Tobacco comments:    Quit smoking cigarettes 25 yrs ago, quit cigars in 2020  Vaping Use   Vaping Use: Never used  Substance and Sexual Activity   Alcohol use: Yes    Alcohol/week: 6.0 - 9.0 standard drinks of alcohol     Types: 6 - 9 Standard drinks or equivalent per week   Drug use: No   Sexual activity: Not on file  Other Topics Concern   Not on file  Social History Narrative   Not on file   Social Determinants of Health   Financial Resource Strain: Not on file  Food Insecurity: Not on file  Transportation Needs: Not on file  Physical Activity: Not on file  Stress: Not on file  Social Connections: Not on file  Intimate Partner Violence: Not on file   Family History:  Family History  Problem Relation Age of Onset   Cancer Paternal Grandfather        Prostate    Review of Systems: Constitutional: swelling in face Eyes:  Doesn't report blurriness of vision Ears, nose, mouth, throat, and face: Doesn't report sore throat Respiratory: Doesn't report cough, dyspnea or wheezes Cardiovascular: Doesn't report palpitation, chest discomfort  Gastrointestinal:  Doesn't report nausea, constipation, diarrhea GU: difficulty initiating stream, nocturia Skin: Doesn't report skin rashes Neurological: Per HPI Musculoskeletal: Doesn't report joint pain Behavioral/Psych: Doesn't report anxiety  Physical Exam: Vitals:   11/05/21 1223  BP: 139/66  Pulse: 69  Resp: 18  Temp: 97.9 F (36.6 C)  SpO2: 96%    KPS: 60. General: normal Head: Normal EENT: No conjunctival injection or scleral icterus.  Lungs: Resp effort normal Cardiac: Regular rate Abdomen: Non-distended abdomen Skin: No rashes cyanosis or petechiae. Extremities: No clubbing or edema  Neurologic Exam: Mental Status: Awake, alert, attentive to examiner. Oriented to self and environment. Language is fluent with intact comprehension.  Cranial Nerves: Visual acuity is grossly normal. L visual field impairment. Extra-ocular movements intact. No ptosis. Face is symmetric Motor: Tone and bulk are normal. Power is 1/5 in left arm and 3/5 in left leg. Reflexes are symmetric, no pathologic reflexes present.  Sensory: Intact to light  touch Gait: Hemiparetic, not independent  Labs: I have reviewed the data as listed    Component Value Date/Time   NA 138 01/30/2021 1220   K 4.5 01/30/2021 1220   CL 104 01/30/2021 1220   CO2 29 01/30/2021 1220   GLUCOSE 95 01/30/2021 1220   BUN 10 01/30/2021 1220   CREATININE 0.67 01/30/2021 1220   CREATININE 0.83 03/04/2019 1206   CALCIUM 9.7 01/30/2021 1220   PROT 7.0 01/30/2021 1220   ALBUMIN 4.3 01/30/2021 1220   AST 11 (L) 01/30/2021 1220   ALT 16 01/30/2021 1220   ALKPHOS 78 01/30/2021 1220   BILITOT 0.7 01/30/2021 1220   GFRNONAA >60 01/30/2021 1220   GFRNONAA 106 03/04/2019 1206   GFRAA >60 10/25/2019 1209   GFRAA 123 03/04/2019 1206   Lab Results  Component Value Date   WBC 5.6 01/30/2021   NEUTROABS 3.8 01/30/2021   HGB 15.8 01/30/2021   HCT 44.7 01/30/2021   MCV 87.0 01/30/2021   PLT 210 01/30/2021    Imaging:  [place] Clinician Interpretation: I have personally reviewed the CNS images as listed.  My interpretation, in the context of the patient's clinical presentation, is treatment effect vs true progression  MR BRAIN W WO CONTRAST  Result Date: 11/04/2021 CLINICAL DATA:  Provided history: Brain/CNS neoplasm, assess treatment response. EXAM: MRI HEAD WITHOUT AND WITH CONTRAST TECHNIQUE: Multiplanar, multiecho pulse sequences of the brain and surrounding structures were obtained without and with intravenous contrast. CONTRAST:  8 mL Vueway intravenous contrast. COMPARISON:  Prior brain MRI examinations 07/19/2021 and earlier. FINDINGS: Brain: Redemonstrated infiltrative T2 FLAIR hyperintense mass centered within the right thalamus and midbrain with irregular and nodular enhancement and cavities. The extent of enhancement has not significantly changed from the most recent prior brain MRI of 07/19/2021. The dominant peripherally enhancing cystic cavity within the mass has minimally increased in size, now measuring 2.2 cm in greatest dimension (previously measuring 2  cm in greatest dimension). As before, there is irregular hemorrhage within the mass. The extent of T2 FLAIR hyperintense signal abnormality in the region of the mass has not significantly changed. Foci of enhancement along the third ventricle and cerebral aqueduct have also not significantly changed. Stable ventriculomegaly. Background multifocal T2 FLAIR hyperintensity within the cerebral white matter, nonspecific but compatible with chronic small vessel disease. Vascular: Maintained flow voids within the proximal large arterial  vessels. Skull and upper cervical spine: No focal suspicious marrow lesion. Right frontal burr hole. Sinuses/Orbits: No mass or acute finding within the imaged orbits. No significant paranasal sinus disease. IMPRESSION: The dominant peripherally enhancing cystic cavity within the tumor has minimally increased in size, now measuring 2.2 cm (previously 2 cm). The infiltrative mass centered within the right thalamus and midbrain has otherwise not significantly changed from the prior brain MRI of 07/19/2021. Unchanged ventriculomegaly. Electronically Signed   By: Kellie Simmering D.O.   On: 11/04/2021 10:35    Assessment/Plan Anaplastic astrocytoma of thalamus ([place]) [C71.0]  Mr. Pistilli is clinically stable today.  Brain MRI demonstrates stable burden of enhancement, but continued slow progression of cystic volume.  This is not symptomatic at this time.  We will continue with close MRI surveillance.   For ongoing fatigue issues, will check serum Vit B12, Vit D, testosterone, cortisol, TSH.  He will continue with home PT and OT.  Will refer to social work for counseling, support.  We appreciate the opportunity to participate in the care of Aaron Espinoza.   We ask that Aaron Espinoza return to clinic in 2 months following next brain MRI, or sooner as needed.  All questions were answered. The patient knows to call the clinic with any problems, questions or concerns. No  barriers to learning were detected.  I have spent a total of 30 minutes of face-to-face and non-face-to-face time, excluding clinical staff time, preparing to see patient, ordering tests and/or medications, counseling the patient, and independently interpreting results and communicating results to the patient/family/caregiver   Ventura Sellers, MD Medical Director of Neuro-Oncology [facility] 11/05/21 12:21 PM

## 2021-11-05 NOTE — Progress Notes (Signed)
[place] Work  Clinical Social Work was referred by medical provider for assessment of psychosocial needs.  Clinical Social Worker contacted patient by phone  to offer support and assess for needs.    Patient confirmed that he would like an electric w/c.  He agreed to supportive counseling which is scheduled for Monday, 10/9 at 1pm via telephone.  He requested CSW also speak with his wife, [name].  She is going to review his Medicare benefits regarding DME and will contact CSW in order to proceed.  He has lost the use of his left arm, therefore cannot use a standard w/c.  She feels his quality of life would improve and that he would benefit from counseling.     Margaree Mackintosh, LCSW  Clinical Social Worker [facility]

## 2021-11-06 LAB — TESTOSTERONE: Testosterone: 791 ng/dL (ref 264–916)

## 2021-11-06 NOTE — Addendum Note (Signed)
Addended by: Ventura Sellers on: 11/06/2021 12:41 PM   Modules accepted: Orders

## 2021-11-07 ENCOUNTER — Other Ambulatory Visit: Payer: Self-pay | Admitting: Radiation Therapy

## 2021-11-12 ENCOUNTER — Inpatient Hospital Stay: Payer: BC Managed Care – PPO

## 2021-11-12 NOTE — Progress Notes (Signed)
[place] CSW Counseling Note  Patient was referred by medical provider. Treatment type: Individual  Presenting Concerns: Patient and/or family reports the following symptoms/concerns: depression Duration of problem: 6-8 months; Severity of problem: moderate.  Patient is currently taking an antidepressant which he states is effective.   Orientation:oriented to person, place, time/date, situation, day of week, month of year, and year.   Affect: NA Risk of harm to self or others: No plan to harm self or others  Patient and/or Family's Strengths/Protective Factors:  His wife, [name] is very supportive.  He reports not being emotionally close to his parents and older sister.  He has four adult children. Concrete supports in place (healthy food, safe environments, etc.)Communication skills  Financial means  General fund of knowledge  Motivation for treatment/growth  Supportive family/friends  Work skills  - Patient worked for [employer] for 21 years.     Goals Addressed: Patient will:  Reduce symptoms of: depression Increase knowledge and/or ability of: coping skills  Increase healthy adjustment to current life circumstances   Progress towards Goals: Initial   Interventions: Interventions utilized:  CBT, Supportive, and Reframing      Assessment: Patient currently experiencing adjustment issues related to physical decline.      Plan: Follow up with CSW: Monday, 11/19/21 at 1pm. Behavioral recommendations: Continue gratitude practice. Referral(s): Support group(s):  Individual counseling. 4.   Encouraged [name] to contact Medicare to determine if they must use a contracted DME company to request a prescription for an electric w/c.      Rodman Pickle Roselee Tayloe, LCSW

## 2021-11-19 ENCOUNTER — Inpatient Hospital Stay: Payer: BC Managed Care – PPO

## 2021-11-20 NOTE — Progress Notes (Signed)
[place] CSW Counseling Note  Patient was referred by medical provider. Treatment type: Individual  Presenting Concerns: Patient and/or family reports the following symptoms/concerns: depression Duration of problem: 6-8 months; Severity of problem: moderate   Orientation:oriented to person, place, time/date, situation, day of week, month of year, and year.   Affect: NA and Non-Congruent due to session being via telephone. Risk of harm to self or others: No plan to harm self or others  Patient and/or Family's Strengths/Protective Factors: Concrete supports in place (healthy food, safe environments, etc.)Communication skills  Financial means  General fund of knowledge  Motivation for treatment/growth  Supportive family/friends      Goals Addressed: Patient will:  Reduce symptoms of: depression Increase knowledge and/or ability of: coping skills and stress reduction  Increase healthy adjustment to current life circumstances   Progress towards Goals: Progressing   Interventions: Interventions utilized:  CBT, Strength-based, Supportive, and Family Systems      Assessment: Patient currently experiencing adjustment issues related to decrease in physical abilities.  Explored his feelings about what contributing to his household means to him.  Discussed what brings him joy and how often he goes outside.      Plan: Follow up with CSW: Monday, 10/30, at 1pm via telephone. Behavioral recommendations: Assist spouse with washing the dishes to help contribute to the household, go outside at least twice a week, and make a playlist of songs that bring him joy.     Rodman Pickle Tomie Spizzirri, LCSW

## 2021-12-03 ENCOUNTER — Inpatient Hospital Stay: Payer: BC Managed Care – PPO | Admitting: Licensed Clinical Social Worker

## 2021-12-03 NOTE — Progress Notes (Signed)
[place] CSW Counseling Note  Patient was referred by medical provider. Treatment type: Individual  Presenting Concerns: Patient and/or family reports the following symptoms/concerns: depression Duration of problem: 6-8 months; Severity of problem: moderate   Orientation:oriented to person, place, time/date, situation, day of week, month of year, and year.   Affect: NA due to phone session. Risk of harm to self or others: No plan to harm self or others  Patient and/or Family's Strengths/Protective Factors: Social connections and Concrete supports in place (healthy food, safe environments, etc.)Communication Biochemist, clinical fund of knowledge  Motivation for treatment/growth  Supportive family/friends      Goals Addressed: Patient will:  Reduce symptoms of: depression Increase knowledge and/or ability of: coping skills  Increase healthy adjustment to current life circumstances   Progress towards Goals: Progressing   Interventions: Interventions utilized:  Play Therapy, Strength-based, Supportive, and Family Systems      Assessment: Patient currently experiencing continued adjustment issues to his decrease in abilities.  He did listen to at least two hours of music this week instead of watching tv.  He didn't go outside or help with dishes as previously discussed.  His family is coming early to spend Thanksgiving with him and he is very excited.  Explored expressive arts and he is open to trying painting with Human resources officer.  His wife has not contacted Medicare yet to determine where to request an automated w/c.      Plan: Follow up with CSW: Monday, 11/13, at [employer] recommendations: Continue exploring expressive arts, including Film/video editor.  When negative self-talk arises, ask yourself if it is true.        Rodman Pickle Tametha Banning, LCSW

## 2021-12-04 ENCOUNTER — Other Ambulatory Visit: Payer: Self-pay | Admitting: Radiation Oncology

## 2021-12-13 ENCOUNTER — Other Ambulatory Visit: Payer: Self-pay | Admitting: Internal Medicine

## 2022-01-04 ENCOUNTER — Telehealth: Payer: Self-pay

## 2022-01-04 NOTE — Telephone Encounter (Signed)
CSW attempted to contact patient to assess psychosocial needs.  Left vm. 

## 2022-01-06 ENCOUNTER — Ambulatory Visit (HOSPITAL_COMMUNITY): Payer: BC Managed Care – PPO

## 2022-01-07 ENCOUNTER — Other Ambulatory Visit: Payer: BC Managed Care – PPO

## 2022-01-07 ENCOUNTER — Inpatient Hospital Stay: Payer: BC Managed Care – PPO

## 2022-01-07 ENCOUNTER — Inpatient Hospital Stay: Payer: BC Managed Care – PPO | Admitting: Internal Medicine

## 2022-01-08 ENCOUNTER — Other Ambulatory Visit: Payer: BC Managed Care – PPO

## 2022-01-10 ENCOUNTER — Inpatient Hospital Stay: Payer: BC Managed Care – PPO | Admitting: Internal Medicine

## 2022-01-14 ENCOUNTER — Inpatient Hospital Stay: Payer: BC Managed Care – PPO

## 2022-01-15 ENCOUNTER — Ambulatory Visit (INDEPENDENT_AMBULATORY_CARE_PROVIDER_SITE_OTHER): Payer: BC Managed Care – PPO

## 2022-01-15 DIAGNOSIS — C710 Malignant neoplasm of cerebrum, except lobes and ventricles: Secondary | ICD-10-CM

## 2022-01-15 DIAGNOSIS — G9389 Other specified disorders of brain: Secondary | ICD-10-CM | POA: Diagnosis not present

## 2022-01-15 MED ORDER — GADOBUTROL 1 MMOL/ML IV SOLN
8.4000 mL | Freq: Once | INTRAVENOUS | Status: AC | PRN
  Administered 2022-01-15: 8.4 mL via INTRAVENOUS

## 2022-01-17 ENCOUNTER — Inpatient Hospital Stay: Payer: BC Managed Care – PPO | Attending: Internal Medicine | Admitting: Internal Medicine

## 2022-01-17 DIAGNOSIS — C710 Malignant neoplasm of cerebrum, except lobes and ventricles: Secondary | ICD-10-CM

## 2022-01-17 NOTE — Progress Notes (Signed)
I connected with Aaron Espinoza on 01/17/22 at  2:30 PM EST by telephone visit and verified that I am speaking with the correct person using two identifiers.  I discussed the limitations, risks, security and privacy concerns of performing an evaluation and management service by telemedicine and the availability of in-person appointments. I also discussed with the patient that there may be a patient responsible charge related to this service. The patient expressed understanding and agreed to proceed.  Other persons participating in the visit and their role in the encounter:  spouse  Patient's location:  Home Provider's location:  Office Chief Complaint:  Anaplastic astrocytoma of thalamus (Aaron Espinoza)  History of Present Ilness: Aaron Espinoza reports no major clinical changes today.  Fatigue is somewhat increased, left sided weakness remains dense as prior.  He did resume the hydrocortisone '10mg'$  twice per day yesterady, unclear if this has helped him.  Observations: Language and cognition at baseline  Imaging:  [place] Clinician Interpretation: I have personally reviewed the CNS images as listed.  My interpretation, in the context of the patient's clinical presentation, is  cystic component increased  MR BRAIN W WO CONTRAST  Result Date: 01/16/2022 CLINICAL DATA:  Brain/CNS neoplasm, assess treatment response. EXAM: MRI HEAD WITHOUT AND WITH CONTRAST TECHNIQUE: Multiplanar, multiecho pulse sequences of the brain and surrounding structures were obtained without and with intravenous contrast. CONTRAST:  8.11m GADAVIST GADOBUTROL 1 MMOL/ML IV SOLN COMPARISON:  11/02/2021 FINDINGS: Brain: No acute infarct, mass effect or extra-axial collection. Minimal chronic microhemorrhage at the site of the right thalamic lesion. The area of hyperintense T2-weighted signal within the right thalamus and extending into the midbrain and the right temporal lobe is unchanged. The dominant cystic cavity has increased in size  to 2.0 x 1.6 cm, previously 1.6 x 1.2 cm. The midline structures are normal. Three age in of heterogeneous contrast enhancement is unchanged. There is an old shunt tract from a right frontal approach. Vascular: Major flow voids are preserved. Skull and upper cervical spine: Old right frontal burr hole. Sinuses/Orbits:No paranasal sinus fluid levels or advanced mucosal thickening. No mastoid or middle ear effusion. Normal orbits. IMPRESSION: Continued slight increase in size of the dominant cystic cavity within the otherwise unchanged infiltrative mass centered at the right thalamus. Electronically Signed   By: KUlyses JarredM.D.   On: 01/16/2022 02:40     Assessment and Plan: Ana[place] ast[place] of [place]amus ([place]  Aaron CONELYis clinically stable today.  MRI brain demonstrates unchanged volume of enhancement and T2 signal abnormality c/w stable neoplastic components.  R insular cysic cavity has increased by a couple of mm over the past 2 months.  This is not currently amenable to intervention, resection.  Etiology is suspected benign fluid collection, patient is already densely plegic on the left.   We recommended continued surveillance.  He is agreeable with this.  Hydrocortisone may be continued '10mg'$  bid.  Follow Up Instructions: We ask that Aaron ORAVECreturn to clinic in 3 months following next brain MRI, or sooner as needed.  I discussed the assessment and treatment plan with the patient.  The patient was provided an opportunity to ask questions and all were answered.  The patient agreed with the plan and demonstrated understanding of the instructions.    The patient was advised to call back or seek an in-person evaluation if the symptoms worsen or if the condition fails to improve as anticipated.    ZVentura Sellers MD   I  provided 23 minutes of non face-to-face telephone visit time during this encounter, and > 50% was spent counseling as documented under my assessment &  plan.

## 2022-01-18 ENCOUNTER — Telehealth: Payer: Self-pay | Admitting: Internal Medicine

## 2022-01-18 NOTE — Telephone Encounter (Signed)
Called to schedule phone visit f/u. Left voicemail with new appointment information.

## 2022-01-21 ENCOUNTER — Inpatient Hospital Stay: Payer: BC Managed Care – PPO

## 2022-04-16 ENCOUNTER — Encounter: Payer: Self-pay | Admitting: *Deleted

## 2022-04-25 ENCOUNTER — Inpatient Hospital Stay: Payer: BC Managed Care – PPO | Admitting: Internal Medicine

## 2022-04-30 ENCOUNTER — Ambulatory Visit (HOSPITAL_COMMUNITY)
Admission: RE | Admit: 2022-04-30 | Discharge: 2022-04-30 | Disposition: A | Discharge status: Home / Self Care | Payer: BC Managed Care – PPO | Source: Ambulatory Visit | Attending: Internal Medicine | Admitting: Internal Medicine

## 2022-04-30 DIAGNOSIS — C710 Malignant neoplasm of cerebrum, except lobes and ventricles: Secondary | ICD-10-CM | POA: Insufficient documentation

## 2022-04-30 MED ORDER — GADOBUTROL 1 MMOL/ML IV SOLN
8.5000 mL | Freq: Once | INTRAVENOUS | Status: AC | PRN
  Administered 2022-04-30: 8.5 mL via INTRAVENOUS

## 2022-05-06 ENCOUNTER — Inpatient Hospital Stay: Payer: BC Managed Care – PPO | Attending: Internal Medicine | Admitting: Internal Medicine

## 2022-05-06 ENCOUNTER — Telehealth: Payer: Self-pay | Admitting: Internal Medicine

## 2022-05-06 DIAGNOSIS — C710 Malignant neoplasm of cerebrum, except lobes and ventricles: Secondary | ICD-10-CM

## 2022-05-06 NOTE — Telephone Encounter (Signed)
Per 4/1 LOS reached out to patient to schedule; left voicemail.

## 2022-05-06 NOTE — Progress Notes (Signed)
I connected with Aaron Espinoza on 05/06/22 at 12:30 PM EDT by telephone visit and verified that I am speaking with the correct person using two identifiers.  I discussed the limitations, risks, security and privacy concerns of performing an evaluation and management service by telemedicine and the availability of in-person appointments. I also discussed with the patient that there may be a patient responsible charge related to this service. The patient expressed understanding and agreed to proceed.  Other persons participating in the visit and their role in the encounter:  spouse  Patient's location:  Home Provider's location:  Office Chief Complaint:  Anaplastic astrocytoma of thalamus  History of Present Ilness: Aaron Espinoza reports no major clinical changes today.  Fatigue is somewhat increased, left sided weakness remains dense as prior.  He did resume the hydrocortisone 10mg  twice per day yesterady, unclear if this has helped him.  Observations: Language and cognition at baseline  Imaging:  [facility] Interpretation: I have personally reviewed the CNS images as listed.  My interpretation, in the context of the patient's clinical presentation, is  cystic component increased  MR BRAIN W WO CONTRAST  Result Date: 05/02/2022 CLINICAL DATA:  CNS neoplasm follow-up EXAM: MRI HEAD WITHOUT AND WITH CONTRAST TECHNIQUE: Multiplanar, multiecho pulse sequences of the brain and surrounding structures were obtained without and with intravenous contrast. CONTRAST:  8.82mL GADAVIST GADOBUTROL 1 MMOL/ML IV SOLN COMPARISON:  MRI Head 01/15/22, 11/02/21 FINDINGS: Brain: No acute infarct, mass effect or extraaxial collection. Minimal chronic microhemorrhage at the site of the right thalamic lesion. Redemonstrated infiltrative T2/FLAIR hyperintense mass centered around the right thalamus. There is progressive increase in size of the cystic cavity associated with this lesion, now measuring 2.3 x 2.2 cm in  the greatest axial dimension, previously 1.9 x 1.7 cm on 01/15/22, and 1.5 x 1.3 cm on 11/02/21. The extent of T2/FLAIR hyperintense signal abnormality has also slightly increased, particularly along the along the lateral margin (series 9, image 26). T2/FLAIR hyperintense signal around the right temporal horn has also slightly increased (series 9, image 17). Compared to prior exam there is new small focus of contrast enhancement in the periventricular right temporal lobe (series 16, image 66) measuring 3 x 3 mm. The overall area of heterogeneous contrast enhancement surrounding the right thalamus unchanged from prior exam. Vascular: Normal flow voids. Skull and upper cervical spine: Normal marrow signal. Right frontal burr hole craniotomy. Sinuses/Orbits: Negative. Other: None. IMPRESSION: 1. Redemonstrated infiltrative T2/FLAIR hyperintense mass centered around the right thalamus, with progressive increase in size of the cystic cavity associated with this lesion, as well as slight increase in T2/FLAIR hyperintense signal along the lateral aspect of the mass and around the right temporal horn. 2. New small focus of contrast enhancement in the periventricular right temporal lobe, concerning for a new site of disease. Electronically Signed   By: Marin Roberts M.D.   On: 05/02/2022 14:50     Assessment and Plan: Anaplastic astrocytoma of thalamus  Aaron Espinoza is clinically stable today.  MRI brain again demonstrates increase in R insular cysic cavity, also visible on prior study.  This is not currently amenable to intervention, resection.  Etiology is suspected benign fluid collection, patient is already densely plegic on the left.  Small new focus of enhancement along right lateral ventricle is also noted, we will con't to surveill this finding as well.   We recommended continued surveillance.  He is agreeable with this.  Hydrocortisone may be continued 10mg  bid.  Follow Up Instructions: We ask that  Aaron Espinoza return to clinic in 2 months following next brain MRI, or sooner as needed.  I discussed the assessment and treatment plan with the patient.  The patient was provided an opportunity to ask questions and all were answered.  The patient agreed with the plan and demonstrated understanding of the instructions.    The patient was advised to call back or seek an in-person evaluation if the symptoms worsen or if the condition fails to improve as anticipated.    Ventura Sellers, MD   I provided 23 minutes of non face-to-face telephone visit time during this encounter, and > 50% was spent counseling as documented under my assessment & plan.

## 2022-05-10 ENCOUNTER — Other Ambulatory Visit: Payer: Self-pay | Admitting: Radiation Therapy

## 2022-05-13 ENCOUNTER — Inpatient Hospital Stay: Payer: BC Managed Care – PPO

## 2022-07-11 ENCOUNTER — Ambulatory Visit (HOSPITAL_COMMUNITY)
Admission: RE | Admit: 2022-07-11 | Discharge: 2022-07-11 | Disposition: A | Discharge status: Home / Self Care | Payer: BC Managed Care – PPO | Source: Ambulatory Visit | Attending: Internal Medicine | Admitting: Internal Medicine

## 2022-07-11 DIAGNOSIS — C710 Malignant neoplasm of cerebrum, except lobes and ventricles: Secondary | ICD-10-CM | POA: Diagnosis present

## 2022-07-11 MED ORDER — GADOBUTROL 1 MMOL/ML IV SOLN
8.0000 mL | Freq: Once | INTRAVENOUS | Status: AC | PRN
  Administered 2022-07-11: 8 mL via INTRAVENOUS

## 2022-07-15 ENCOUNTER — Inpatient Hospital Stay: Payer: BC Managed Care – PPO | Attending: Internal Medicine | Admitting: Internal Medicine

## 2022-07-15 DIAGNOSIS — H53462 Homonymous bilateral field defects, left side: Secondary | ICD-10-CM | POA: Diagnosis not present

## 2022-07-15 DIAGNOSIS — C710 Malignant neoplasm of cerebrum, except lobes and ventricles: Secondary | ICD-10-CM | POA: Diagnosis not present

## 2022-07-15 NOTE — Progress Notes (Signed)
Faxed referral to [facility] under routing records

## 2022-07-15 NOTE — Progress Notes (Signed)
I connected with Aaron Espinoza on 07/15/22 at 12:00 PM EDT by telephone visit and verified that I am speaking with the correct person using two identifiers.   I discussed the limitations, risks, security and privacy concerns of performing an evaluation and management service by telemedicine and the availability of in-person appointments. I also discussed with the patient that there may be a patient responsible charge related to this service. The patient expressed understanding and agreed to proceed.  Other persons participating in the visit and their role in the encounter:  spouse   Patient's location:  Home Provider's location:  Office Chief Complaint:  Anaplastic astrocytoma of thalamus (HCC)  History of Present Ilness: Aaron Espinoza reports no major clinical changes today.  He does report ongoing blurry vision, separate from visual field issues.  Fatigue is stable, left sided weakness remains dense as prior.  Planning upcoming move to [facility] area.  Observations: Language and cognition at baseline  Imaging:  [facility] Clinician Interpretation: I have personally reviewed the CNS images as listed.  My interpretation, in the context of the patient's clinical presentation, is  cystic component increased , right temporal nodule resolved.  Pending official read.  No results found.   Assessment and Plan: Anaplastic astrocytoma of thalamus (HCC)  Aaron Espinoza is clinically stable today.  MRI brain again demonstrates increase in R insular cysic cavity, also visible on prior study.  This is not currently amenable to intervention, resection.  Etiology is suspected benign fluid collection, patient is already densely plegic on the left.  Small new focus of enhancement along right lateral ventricle is resolved, pending official read.   We recommended continued surveillance.  He is agreeable with this.  Will place referral to ophthalmology for visual changes separate from hemianopia.    Hydrocortisone may be continued 10mg  bid.  Follow Up Instructions: We ask that Aaron Espinoza return to clinic in 4 months following next brain MRI, or sooner as needed.  I discussed the assessment and treatment plan with the patient.  The patient was provided an opportunity to ask questions and all were answered.  The patient agreed with the plan and demonstrated understanding of the instructions.    The patient was advised to call back or seek an in-person evaluation if the symptoms worsen or if the condition fails to improve as anticipated.    Henreitta Leber, MD   I provided 23 minutes of non face-to-face telephone visit time during this encounter, and > 50% was spent counseling as documented under my assessment & plan.

## 2022-08-06 ENCOUNTER — Other Ambulatory Visit: Payer: Self-pay | Admitting: Radiation Oncology

## 2022-09-18 ENCOUNTER — Other Ambulatory Visit: Payer: Self-pay | Admitting: *Deleted

## 2022-09-18 MED ORDER — PANTOPRAZOLE SODIUM 40 MG PO TBEC: 40.0000 mg | DELAYED_RELEASE_TABLET | Freq: Every day | ORAL | 2 refills | Status: AC

## 2022-11-03 ENCOUNTER — Other Ambulatory Visit: Payer: Self-pay | Admitting: Internal Medicine

## 2022-11-26 ENCOUNTER — Ambulatory Visit (HOSPITAL_COMMUNITY): Payer: BC Managed Care – PPO

## 2022-11-26 ENCOUNTER — Ambulatory Visit (HOSPITAL_COMMUNITY): Admission: RE | Admit: 2022-11-26 | Payer: BC Managed Care – PPO | Source: Ambulatory Visit

## 2022-11-27 ENCOUNTER — Ambulatory Visit (HOSPITAL_COMMUNITY)
Admission: RE | Admit: 2022-11-27 | Discharge: 2022-11-27 | Disposition: A | Discharge status: Home / Self Care | Payer: BC Managed Care – PPO | Source: Ambulatory Visit | Attending: Internal Medicine | Admitting: Internal Medicine

## 2022-11-27 DIAGNOSIS — C710 Malignant neoplasm of cerebrum, except lobes and ventricles: Secondary | ICD-10-CM | POA: Insufficient documentation

## 2022-11-27 MED ORDER — GADOBUTROL 1 MMOL/ML IV SOLN
8.0000 mL | Freq: Once | INTRAVENOUS | Status: AC | PRN
  Administered 2022-11-27: 8 mL via INTRAVENOUS

## 2022-12-02 ENCOUNTER — Inpatient Hospital Stay: Payer: BC Managed Care – PPO | Attending: Internal Medicine | Admitting: Internal Medicine

## 2022-12-02 DIAGNOSIS — C710 Malignant neoplasm of cerebrum, except lobes and ventricles: Secondary | ICD-10-CM

## 2022-12-02 NOTE — Progress Notes (Signed)
I connected with Aaron Espinoza on 12/02/22 at 11:00 AM EDT by telephone visit and verified that I am speaking with the correct person using two identifiers.   I discussed the limitations, risks, security and privacy concerns of performing an evaluation and management service by telemedicine and the availability of in-person appointments. I also discussed with the patient that there may be a patient responsible charge related to this service. The patient expressed understanding and agreed to proceed.  Other persons participating in the visit and their role in the encounter:  spouse   Patient's location:  Home Provider's location:  Office Chief Complaint:  Anaplastic astrocytoma of thalamus (HCC)  History of Present Ilness: Aaron Espinoza reports no major clinical changes today.  No further changes with his vision.  Fatigue is stable, left sided weakness remains dense as prior.  He and his [place] have moved back to [place], and are planning a more permanent move to [facility].  Observations: Language and cognition at baseline  Imaging:  [facility] Clinician Interpretation: I have personally reviewed the CNS images as listed.  My interpretation, in the context of the patient's clinical presentation, is stable disease  MR BRAIN W WO CONTRAST  Result Date: 11/28/2022 CLINICAL DATA:  Anaplastic astrocytoma of the thalamus EXAM: MRI HEAD WITHOUT AND WITH CONTRAST TECHNIQUE: Multiplanar, multiecho pulse sequences of the brain and surrounding structures were obtained without and with intravenous contrast. CONTRAST:  8mL GADAVIST GADOBUTROL 1 MMOL/ML IV SOLN COMPARISON:  Brain MRI most recently 07/11/2022 FINDINGS: Brain: Again seen is a heterogeneously enhancing solid and cystic mass in the right thalamus. The superolateral cystic component in the dorsal lateral thalamus along the lining of the right lateral ventricle today measures 1.7 cm x 1.4 cm in the axial plane, previously measured 2.8 cm x 2.3 cm  (16-92). More inferomedially in the thalamus, the solid and cystic component which extends inferomedially into the midbrain measures 1.7 cm x 2.0 cm with thickened nodular enhancement medially, previously measured 2.7 cm x 2.6 cm (16-81). The enhancing component extending into the cerebral peduncle measures 0.9 cm x 1.1 cm (16-70), previously measured 0.9 cm x 1.3 cm. The solid and cystic component located most medially along the lining of the third ventricle today measures 1.2 cm x 0.8 cm in the axial plane, previously measured 1.2 cm x 0.8 cm (16-84). The thickened nodular enhancement medially and inferiorly is slightly decreased in bulk. FLAIR signal abnormality surrounding the mass extending into the external capsule and temporal lobe is decreased (compare images 9-27 and 9-21 on the current study 2 images 9-26 and 9-20 on the most recent prior study. There is mild regional mass effect with partial effacement of third ventricle but no progressive upstream hydrocephalus. There is no acute intracranial hemorrhage, extra-axial fluid collection, or acute infarct. Parenchymal volume is stable. The ventricles are stable in size. The pituitary and suprasellar region are normal. There is no midline shift. Vascular: Normal flow voids. Skull and upper cervical spine: A right frontal burr hole is again noted. Sinuses/Orbits: The paranasal sinuses are clear. The globes and orbits are unremarkable. Other: The mastoid air cells and middle ear cavities are clear. IMPRESSION: Decreased size of the solid and cystic mass centered in the right thalamus since the study from 07/11/2022, with decreased surrounding FLAIR signal abnormality and unchanged mild regional mass effect resulting in partial effacement of the third ventricle but no upstream hydrocephalus. Electronically Signed   By: Lesia Hausen M.D.   On: 11/28/2022  08:21     Assessment and Plan: Anaplastic astrocytoma of thalamus (HCC)  Aaron Espinoza is clinically  stable today.  MRI brain today demonstrates stable findings, cyst cavity has decreased in cross sectional dimension.  No change in burden of enhancement.  We recommended continued surveillance, now in [place].  Should repeat MRI in 4 months.  No longer dosing corticosteroids, stimulants.  Follow Up Instructions: We ask that Aaron Espinoza return to clinic as needed given change in residence to .  I discussed the assessment and treatment plan with the patient.  The patient was provided an opportunity to ask questions and all were answered.  The patient agreed with the plan and demonstrated understanding of the instructions.    The patient was advised to call back or seek an in-person evaluation if the symptoms worsen or if the condition fails to improve as anticipated.    Henreitta Leber, MD   I provided 23 minutes of non face-to-face telephone visit time during this encounter, and > 50% was spent counseling as documented under my assessment & plan.

## 2023-05-02 ENCOUNTER — Other Ambulatory Visit: Payer: Self-pay | Admitting: Internal Medicine
